# Patient Record
Sex: Male | Born: 1940
Health system: Southern US, Community
[De-identification: ages and names within clinical notes are randomized; demographics above are authoritative.]

## PROBLEM LIST (undated history)

## (undated) DIAGNOSIS — K219 Gastro-esophageal reflux disease without esophagitis: Secondary | ICD-10-CM

## (undated) DIAGNOSIS — I499 Cardiac arrhythmia, unspecified: Secondary | ICD-10-CM

## (undated) DIAGNOSIS — C3490 Malignant neoplasm of unspecified part of unspecified bronchus or lung: Secondary | ICD-10-CM

## (undated) DIAGNOSIS — J449 Chronic obstructive pulmonary disease, unspecified: Secondary | ICD-10-CM

## (undated) DIAGNOSIS — E119 Type 2 diabetes mellitus without complications: Secondary | ICD-10-CM

## (undated) DIAGNOSIS — I2699 Other pulmonary embolism without acute cor pulmonale: Secondary | ICD-10-CM

## (undated) DIAGNOSIS — I5189 Other ill-defined heart diseases: Secondary | ICD-10-CM

## (undated) DIAGNOSIS — I509 Heart failure, unspecified: Secondary | ICD-10-CM

## (undated) DIAGNOSIS — M109 Gout, unspecified: Secondary | ICD-10-CM

## (undated) DIAGNOSIS — Z8546 Personal history of malignant neoplasm of prostate: Secondary | ICD-10-CM

## (undated) DIAGNOSIS — E785 Hyperlipidemia, unspecified: Secondary | ICD-10-CM

## (undated) DIAGNOSIS — I10 Essential (primary) hypertension: Secondary | ICD-10-CM

## (undated) DIAGNOSIS — J45909 Unspecified asthma, uncomplicated: Secondary | ICD-10-CM

## (undated) DIAGNOSIS — I493 Ventricular premature depolarization: Secondary | ICD-10-CM

## (undated) DIAGNOSIS — R5383 Other fatigue: Secondary | ICD-10-CM

## (undated) HISTORY — DX: Malignant neoplasm of unspecified part of unspecified bronchus or lung: C34.90

## (undated) HISTORY — PX: HERNIA REPAIR: SHX51

## (undated) HISTORY — DX: Chronic obstructive pulmonary disease, unspecified: J44.9

## (undated) HISTORY — DX: Personal history of malignant neoplasm of prostate: Z85.46

## (undated) HISTORY — PX: TONSILLECTOMY: SUR1361

## (undated) HISTORY — DX: Other fatigue: R53.83

## (undated) HISTORY — DX: Ventricular premature depolarization: I49.3

## (undated) HISTORY — PX: CATARACT EXTRACTION, BILATERAL: SHX1313

## (undated) HISTORY — DX: Other pulmonary embolism without acute cor pulmonale: I26.99

## (undated) HISTORY — DX: Heart failure, unspecified: I50.9

## (undated) HISTORY — DX: Type 2 diabetes mellitus without complications: E11.9

## (undated) HISTORY — PX: CHOLECYSTECTOMY: SHX55

## (undated) HISTORY — PX: STENT PLACEMENT VASCULAR (ARMC HX): HXRAD1737

## (undated) HISTORY — DX: Gastro-esophageal reflux disease without esophagitis: K21.9

## (undated) HISTORY — PX: ABDOMINAL AORTIC ANEURYSM REPAIR: SHX42

## (undated) HISTORY — PX: HEMICOLECTOMY: SHX854

## (undated) HISTORY — PX: AORTA - ILIAC ARTERY BYPASS GRAFT: SUR174

## (undated) HISTORY — DX: Other ill-defined heart diseases: I51.89

## (undated) HISTORY — DX: Essential (primary) hypertension: I10

## (undated) HISTORY — DX: Gout, unspecified: M10.9

## (undated) HISTORY — DX: Hyperlipidemia, unspecified: E78.5

## (undated) HISTORY — DX: Unspecified asthma, uncomplicated: J45.909

## (undated) HISTORY — PX: REPLACEMENT TOTAL KNEE: SUR1224

## (undated) HISTORY — DX: Cardiac arrhythmia, unspecified: I49.9

## (undated) HISTORY — PX: PROSTATE SURGERY: SHX751

## (undated) HISTORY — PX: KNEE SURGERY: SHX244

## (undated) HISTORY — PX: BACK SURGERY: SHX140

---

## 2011-02-28 DIAGNOSIS — H43819 Vitreous degeneration, unspecified eye: Secondary | ICD-10-CM | POA: Diagnosis not present

## 2011-03-02 DIAGNOSIS — E785 Hyperlipidemia, unspecified: Secondary | ICD-10-CM | POA: Diagnosis not present

## 2011-03-02 DIAGNOSIS — I6529 Occlusion and stenosis of unspecified carotid artery: Secondary | ICD-10-CM | POA: Diagnosis not present

## 2011-03-02 DIAGNOSIS — M5137 Other intervertebral disc degeneration, lumbosacral region: Secondary | ICD-10-CM | POA: Diagnosis not present

## 2011-03-02 DIAGNOSIS — I10 Essential (primary) hypertension: Secondary | ICD-10-CM | POA: Diagnosis not present

## 2011-03-06 DIAGNOSIS — R0902 Hypoxemia: Secondary | ICD-10-CM | POA: Diagnosis not present

## 2011-03-06 DIAGNOSIS — Z79899 Other long term (current) drug therapy: Secondary | ICD-10-CM | POA: Diagnosis not present

## 2011-03-06 DIAGNOSIS — R0989 Other specified symptoms and signs involving the circulatory and respiratory systems: Secondary | ICD-10-CM | POA: Diagnosis not present

## 2011-03-06 DIAGNOSIS — R0609 Other forms of dyspnea: Secondary | ICD-10-CM | POA: Diagnosis not present

## 2011-03-06 DIAGNOSIS — J309 Allergic rhinitis, unspecified: Secondary | ICD-10-CM | POA: Diagnosis not present

## 2011-03-29 DIAGNOSIS — Z79899 Other long term (current) drug therapy: Secondary | ICD-10-CM | POA: Diagnosis not present

## 2011-04-03 DIAGNOSIS — J309 Allergic rhinitis, unspecified: Secondary | ICD-10-CM | POA: Diagnosis not present

## 2011-04-03 DIAGNOSIS — I519 Heart disease, unspecified: Secondary | ICD-10-CM | POA: Diagnosis not present

## 2011-04-03 DIAGNOSIS — R0609 Other forms of dyspnea: Secondary | ICD-10-CM | POA: Diagnosis not present

## 2011-04-24 DIAGNOSIS — Z79899 Other long term (current) drug therapy: Secondary | ICD-10-CM | POA: Diagnosis not present

## 2011-05-25 DIAGNOSIS — R972 Elevated prostate specific antigen [PSA]: Secondary | ICD-10-CM | POA: Diagnosis not present

## 2011-05-30 DIAGNOSIS — I658 Occlusion and stenosis of other precerebral arteries: Secondary | ICD-10-CM | POA: Diagnosis not present

## 2011-05-30 DIAGNOSIS — I739 Peripheral vascular disease, unspecified: Secondary | ICD-10-CM | POA: Diagnosis not present

## 2011-05-30 DIAGNOSIS — I70299 Other atherosclerosis of native arteries of extremities, unspecified extremity: Secondary | ICD-10-CM | POA: Diagnosis not present

## 2011-06-01 DIAGNOSIS — C61 Malignant neoplasm of prostate: Secondary | ICD-10-CM | POA: Diagnosis not present

## 2011-06-01 DIAGNOSIS — N529 Male erectile dysfunction, unspecified: Secondary | ICD-10-CM | POA: Diagnosis not present

## 2011-06-07 DIAGNOSIS — H6120 Impacted cerumen, unspecified ear: Secondary | ICD-10-CM | POA: Diagnosis not present

## 2011-06-12 DIAGNOSIS — I714 Abdominal aortic aneurysm, without rupture: Secondary | ICD-10-CM | POA: Diagnosis not present

## 2011-06-12 DIAGNOSIS — I6529 Occlusion and stenosis of unspecified carotid artery: Secondary | ICD-10-CM | POA: Diagnosis not present

## 2011-07-27 DIAGNOSIS — H903 Sensorineural hearing loss, bilateral: Secondary | ICD-10-CM | POA: Diagnosis not present

## 2011-07-27 DIAGNOSIS — H9319 Tinnitus, unspecified ear: Secondary | ICD-10-CM | POA: Diagnosis not present

## 2011-08-30 DIAGNOSIS — I509 Heart failure, unspecified: Secondary | ICD-10-CM | POA: Diagnosis not present

## 2011-08-30 DIAGNOSIS — E785 Hyperlipidemia, unspecified: Secondary | ICD-10-CM | POA: Diagnosis not present

## 2011-08-30 DIAGNOSIS — I6529 Occlusion and stenosis of unspecified carotid artery: Secondary | ICD-10-CM | POA: Diagnosis not present

## 2011-08-30 DIAGNOSIS — I714 Abdominal aortic aneurysm, without rupture: Secondary | ICD-10-CM | POA: Diagnosis not present

## 2011-08-30 DIAGNOSIS — I10 Essential (primary) hypertension: Secondary | ICD-10-CM | POA: Diagnosis not present

## 2011-11-07 DIAGNOSIS — Z23 Encounter for immunization: Secondary | ICD-10-CM | POA: Diagnosis not present

## 2011-11-07 DIAGNOSIS — L723 Sebaceous cyst: Secondary | ICD-10-CM | POA: Diagnosis not present

## 2011-11-07 DIAGNOSIS — M5137 Other intervertebral disc degeneration, lumbosacral region: Secondary | ICD-10-CM | POA: Diagnosis not present

## 2011-11-15 DIAGNOSIS — L723 Sebaceous cyst: Secondary | ICD-10-CM | POA: Diagnosis not present

## 2011-11-15 DIAGNOSIS — R221 Localized swelling, mass and lump, neck: Secondary | ICD-10-CM | POA: Diagnosis not present

## 2011-11-15 DIAGNOSIS — R220 Localized swelling, mass and lump, head: Secondary | ICD-10-CM | POA: Diagnosis not present

## 2011-12-10 DIAGNOSIS — L723 Sebaceous cyst: Secondary | ICD-10-CM | POA: Diagnosis not present

## 2011-12-10 DIAGNOSIS — M4800 Spinal stenosis, site unspecified: Secondary | ICD-10-CM | POA: Diagnosis not present

## 2012-01-08 DIAGNOSIS — E7800 Pure hypercholesterolemia, unspecified: Secondary | ICD-10-CM | POA: Diagnosis not present

## 2012-01-08 DIAGNOSIS — E785 Hyperlipidemia, unspecified: Secondary | ICD-10-CM | POA: Diagnosis not present

## 2012-01-08 DIAGNOSIS — I6529 Occlusion and stenosis of unspecified carotid artery: Secondary | ICD-10-CM | POA: Diagnosis not present

## 2012-01-08 DIAGNOSIS — Z0000 Encounter for general adult medical examination without abnormal findings: Secondary | ICD-10-CM | POA: Diagnosis not present

## 2012-02-08 DIAGNOSIS — J40 Bronchitis, not specified as acute or chronic: Secondary | ICD-10-CM | POA: Diagnosis not present

## 2012-02-08 DIAGNOSIS — J029 Acute pharyngitis, unspecified: Secondary | ICD-10-CM | POA: Diagnosis not present

## 2012-02-18 DIAGNOSIS — J449 Chronic obstructive pulmonary disease, unspecified: Secondary | ICD-10-CM | POA: Diagnosis not present

## 2012-02-20 DIAGNOSIS — M79609 Pain in unspecified limb: Secondary | ICD-10-CM | POA: Diagnosis not present

## 2012-02-20 DIAGNOSIS — L600 Ingrowing nail: Secondary | ICD-10-CM | POA: Diagnosis not present

## 2012-03-05 DIAGNOSIS — I10 Essential (primary) hypertension: Secondary | ICD-10-CM | POA: Diagnosis not present

## 2012-04-02 DIAGNOSIS — J449 Chronic obstructive pulmonary disease, unspecified: Secondary | ICD-10-CM | POA: Diagnosis not present

## 2012-04-02 DIAGNOSIS — R062 Wheezing: Secondary | ICD-10-CM | POA: Diagnosis not present

## 2012-05-29 DIAGNOSIS — I719 Aortic aneurysm of unspecified site, without rupture: Secondary | ICD-10-CM | POA: Diagnosis not present

## 2012-05-29 DIAGNOSIS — I70299 Other atherosclerosis of native arteries of extremities, unspecified extremity: Secondary | ICD-10-CM | POA: Diagnosis not present

## 2012-06-19 DIAGNOSIS — I714 Abdominal aortic aneurysm, without rupture: Secondary | ICD-10-CM | POA: Diagnosis not present

## 2012-07-29 DIAGNOSIS — J449 Chronic obstructive pulmonary disease, unspecified: Secondary | ICD-10-CM | POA: Diagnosis not present

## 2012-08-04 DIAGNOSIS — J449 Chronic obstructive pulmonary disease, unspecified: Secondary | ICD-10-CM | POA: Diagnosis not present

## 2012-08-15 DIAGNOSIS — I10 Essential (primary) hypertension: Secondary | ICD-10-CM | POA: Diagnosis not present

## 2012-08-15 DIAGNOSIS — I9589 Other hypotension: Secondary | ICD-10-CM | POA: Diagnosis not present

## 2012-08-15 DIAGNOSIS — E7800 Pure hypercholesterolemia, unspecified: Secondary | ICD-10-CM | POA: Diagnosis not present

## 2012-08-15 DIAGNOSIS — R9431 Abnormal electrocardiogram [ECG] [EKG]: Secondary | ICD-10-CM | POA: Diagnosis not present

## 2012-08-26 DIAGNOSIS — I119 Hypertensive heart disease without heart failure: Secondary | ICD-10-CM | POA: Diagnosis not present

## 2012-08-26 DIAGNOSIS — I2510 Atherosclerotic heart disease of native coronary artery without angina pectoris: Secondary | ICD-10-CM | POA: Diagnosis not present

## 2012-09-04 DIAGNOSIS — I10 Essential (primary) hypertension: Secondary | ICD-10-CM | POA: Diagnosis not present

## 2012-09-04 DIAGNOSIS — I714 Abdominal aortic aneurysm, without rupture: Secondary | ICD-10-CM | POA: Diagnosis not present

## 2012-09-04 DIAGNOSIS — E785 Hyperlipidemia, unspecified: Secondary | ICD-10-CM | POA: Diagnosis not present

## 2012-09-04 DIAGNOSIS — R7309 Other abnormal glucose: Secondary | ICD-10-CM | POA: Diagnosis not present

## 2012-09-11 DIAGNOSIS — R9431 Abnormal electrocardiogram [ECG] [EKG]: Secondary | ICD-10-CM | POA: Diagnosis not present

## 2012-09-11 DIAGNOSIS — E785 Hyperlipidemia, unspecified: Secondary | ICD-10-CM | POA: Diagnosis not present

## 2013-01-27 DIAGNOSIS — E669 Obesity, unspecified: Secondary | ICD-10-CM | POA: Diagnosis not present

## 2013-01-27 DIAGNOSIS — I2510 Atherosclerotic heart disease of native coronary artery without angina pectoris: Secondary | ICD-10-CM | POA: Diagnosis not present

## 2013-01-27 DIAGNOSIS — E785 Hyperlipidemia, unspecified: Secondary | ICD-10-CM | POA: Diagnosis not present

## 2013-01-27 DIAGNOSIS — Z23 Encounter for immunization: Secondary | ICD-10-CM | POA: Diagnosis not present

## 2013-01-27 DIAGNOSIS — C61 Malignant neoplasm of prostate: Secondary | ICD-10-CM | POA: Diagnosis not present

## 2013-01-27 DIAGNOSIS — M4800 Spinal stenosis, site unspecified: Secondary | ICD-10-CM | POA: Diagnosis not present

## 2013-01-27 DIAGNOSIS — J449 Chronic obstructive pulmonary disease, unspecified: Secondary | ICD-10-CM | POA: Diagnosis not present

## 2013-01-27 DIAGNOSIS — I10 Essential (primary) hypertension: Secondary | ICD-10-CM | POA: Diagnosis not present

## 2013-01-27 DIAGNOSIS — R7309 Other abnormal glucose: Secondary | ICD-10-CM | POA: Diagnosis not present

## 2013-01-27 DIAGNOSIS — M5137 Other intervertebral disc degeneration, lumbosacral region: Secondary | ICD-10-CM | POA: Diagnosis not present

## 2013-01-30 DIAGNOSIS — E041 Nontoxic single thyroid nodule: Secondary | ICD-10-CM | POA: Diagnosis not present

## 2013-01-30 DIAGNOSIS — E049 Nontoxic goiter, unspecified: Secondary | ICD-10-CM | POA: Diagnosis not present

## 2013-03-09 DIAGNOSIS — R944 Abnormal results of kidney function studies: Secondary | ICD-10-CM | POA: Diagnosis not present

## 2013-04-22 DIAGNOSIS — J449 Chronic obstructive pulmonary disease, unspecified: Secondary | ICD-10-CM | POA: Diagnosis not present

## 2013-04-30 DIAGNOSIS — Z6836 Body mass index (BMI) 36.0-36.9, adult: Secondary | ICD-10-CM | POA: Diagnosis not present

## 2013-04-30 DIAGNOSIS — M545 Low back pain, unspecified: Secondary | ICD-10-CM | POA: Diagnosis not present

## 2013-05-11 DIAGNOSIS — E119 Type 2 diabetes mellitus without complications: Secondary | ICD-10-CM | POA: Diagnosis present

## 2013-05-11 DIAGNOSIS — M7989 Other specified soft tissue disorders: Secondary | ICD-10-CM | POA: Diagnosis not present

## 2013-05-11 DIAGNOSIS — Z6836 Body mass index (BMI) 36.0-36.9, adult: Secondary | ICD-10-CM | POA: Diagnosis not present

## 2013-05-11 DIAGNOSIS — J4489 Other specified chronic obstructive pulmonary disease: Secondary | ICD-10-CM | POA: Diagnosis not present

## 2013-05-11 DIAGNOSIS — I10 Essential (primary) hypertension: Secondary | ICD-10-CM | POA: Diagnosis not present

## 2013-05-11 DIAGNOSIS — Z87891 Personal history of nicotine dependence: Secondary | ICD-10-CM | POA: Diagnosis not present

## 2013-05-11 DIAGNOSIS — J449 Chronic obstructive pulmonary disease, unspecified: Secondary | ICD-10-CM | POA: Diagnosis not present

## 2013-05-11 DIAGNOSIS — R0902 Hypoxemia: Secondary | ICD-10-CM | POA: Diagnosis not present

## 2013-05-11 DIAGNOSIS — J189 Pneumonia, unspecified organism: Secondary | ICD-10-CM | POA: Diagnosis not present

## 2013-05-11 DIAGNOSIS — Z8546 Personal history of malignant neoplasm of prostate: Secondary | ICD-10-CM | POA: Diagnosis not present

## 2013-05-11 DIAGNOSIS — R51 Headache: Secondary | ICD-10-CM | POA: Diagnosis not present

## 2013-05-11 DIAGNOSIS — J441 Chronic obstructive pulmonary disease with (acute) exacerbation: Secondary | ICD-10-CM | POA: Diagnosis not present

## 2013-05-11 DIAGNOSIS — Z79899 Other long term (current) drug therapy: Secondary | ICD-10-CM | POA: Diagnosis not present

## 2013-05-11 DIAGNOSIS — I739 Peripheral vascular disease, unspecified: Secondary | ICD-10-CM | POA: Diagnosis not present

## 2013-05-11 DIAGNOSIS — R0602 Shortness of breath: Secondary | ICD-10-CM | POA: Diagnosis not present

## 2013-05-11 DIAGNOSIS — R079 Chest pain, unspecified: Secondary | ICD-10-CM | POA: Diagnosis not present

## 2013-05-11 DIAGNOSIS — Z801 Family history of malignant neoplasm of trachea, bronchus and lung: Secondary | ICD-10-CM | POA: Diagnosis not present

## 2013-05-11 DIAGNOSIS — J9610 Chronic respiratory failure, unspecified whether with hypoxia or hypercapnia: Secondary | ICD-10-CM | POA: Diagnosis not present

## 2013-05-11 DIAGNOSIS — Z9981 Dependence on supplemental oxygen: Secondary | ICD-10-CM | POA: Diagnosis not present

## 2013-05-11 DIAGNOSIS — Z8042 Family history of malignant neoplasm of prostate: Secondary | ICD-10-CM | POA: Diagnosis not present

## 2013-05-11 DIAGNOSIS — R0789 Other chest pain: Secondary | ICD-10-CM | POA: Diagnosis not present

## 2013-05-12 DIAGNOSIS — J449 Chronic obstructive pulmonary disease, unspecified: Secondary | ICD-10-CM | POA: Diagnosis not present

## 2013-05-25 DIAGNOSIS — I10 Essential (primary) hypertension: Secondary | ICD-10-CM | POA: Diagnosis not present

## 2013-05-25 DIAGNOSIS — J449 Chronic obstructive pulmonary disease, unspecified: Secondary | ICD-10-CM | POA: Diagnosis not present

## 2013-05-29 DIAGNOSIS — K5730 Diverticulosis of large intestine without perforation or abscess without bleeding: Secondary | ICD-10-CM | POA: Diagnosis not present

## 2013-05-29 DIAGNOSIS — K7689 Other specified diseases of liver: Secondary | ICD-10-CM | POA: Diagnosis not present

## 2013-05-29 DIAGNOSIS — N200 Calculus of kidney: Secondary | ICD-10-CM | POA: Diagnosis not present

## 2013-05-29 DIAGNOSIS — K429 Umbilical hernia without obstruction or gangrene: Secondary | ICD-10-CM | POA: Diagnosis not present

## 2013-06-01 DIAGNOSIS — M5137 Other intervertebral disc degeneration, lumbosacral region: Secondary | ICD-10-CM | POA: Diagnosis not present

## 2013-06-01 DIAGNOSIS — I519 Heart disease, unspecified: Secondary | ICD-10-CM | POA: Diagnosis not present

## 2013-06-05 DIAGNOSIS — K5710 Diverticulosis of small intestine without perforation or abscess without bleeding: Secondary | ICD-10-CM | POA: Diagnosis not present

## 2013-06-05 DIAGNOSIS — K7689 Other specified diseases of liver: Secondary | ICD-10-CM | POA: Diagnosis not present

## 2013-07-29 DIAGNOSIS — I714 Abdominal aortic aneurysm, without rupture, unspecified: Secondary | ICD-10-CM | POA: Diagnosis not present

## 2013-07-29 DIAGNOSIS — E785 Hyperlipidemia, unspecified: Secondary | ICD-10-CM | POA: Diagnosis not present

## 2013-07-29 DIAGNOSIS — E669 Obesity, unspecified: Secondary | ICD-10-CM | POA: Diagnosis not present

## 2013-07-29 DIAGNOSIS — I10 Essential (primary) hypertension: Secondary | ICD-10-CM | POA: Diagnosis not present

## 2013-07-29 DIAGNOSIS — I719 Aortic aneurysm of unspecified site, without rupture: Secondary | ICD-10-CM | POA: Diagnosis not present

## 2013-07-29 DIAGNOSIS — I6529 Occlusion and stenosis of unspecified carotid artery: Secondary | ICD-10-CM | POA: Diagnosis not present

## 2013-07-29 DIAGNOSIS — I658 Occlusion and stenosis of other precerebral arteries: Secondary | ICD-10-CM | POA: Diagnosis not present

## 2013-07-30 DIAGNOSIS — M25559 Pain in unspecified hip: Secondary | ICD-10-CM | POA: Diagnosis not present

## 2013-07-30 DIAGNOSIS — R0609 Other forms of dyspnea: Secondary | ICD-10-CM | POA: Diagnosis not present

## 2013-08-19 DIAGNOSIS — M1610 Unilateral primary osteoarthritis, unspecified hip: Secondary | ICD-10-CM | POA: Diagnosis not present

## 2013-08-19 DIAGNOSIS — M48061 Spinal stenosis, lumbar region without neurogenic claudication: Secondary | ICD-10-CM | POA: Diagnosis not present

## 2013-08-24 DIAGNOSIS — M5137 Other intervertebral disc degeneration, lumbosacral region: Secondary | ICD-10-CM | POA: Diagnosis not present

## 2013-08-24 DIAGNOSIS — M48061 Spinal stenosis, lumbar region without neurogenic claudication: Secondary | ICD-10-CM | POA: Diagnosis not present

## 2013-08-24 DIAGNOSIS — M5126 Other intervertebral disc displacement, lumbar region: Secondary | ICD-10-CM | POA: Diagnosis not present

## 2013-08-27 DIAGNOSIS — M48061 Spinal stenosis, lumbar region without neurogenic claudication: Secondary | ICD-10-CM | POA: Diagnosis not present

## 2013-08-27 DIAGNOSIS — M5137 Other intervertebral disc degeneration, lumbosacral region: Secondary | ICD-10-CM | POA: Diagnosis not present

## 2013-08-27 DIAGNOSIS — M48062 Spinal stenosis, lumbar region with neurogenic claudication: Secondary | ICD-10-CM | POA: Diagnosis not present

## 2013-08-28 DIAGNOSIS — M48061 Spinal stenosis, lumbar region without neurogenic claudication: Secondary | ICD-10-CM | POA: Diagnosis not present

## 2013-08-28 DIAGNOSIS — IMO0002 Reserved for concepts with insufficient information to code with codable children: Secondary | ICD-10-CM | POA: Diagnosis not present

## 2013-08-28 DIAGNOSIS — M5137 Other intervertebral disc degeneration, lumbosacral region: Secondary | ICD-10-CM | POA: Diagnosis not present

## 2013-08-28 DIAGNOSIS — M5126 Other intervertebral disc displacement, lumbar region: Secondary | ICD-10-CM | POA: Diagnosis not present

## 2013-09-24 DIAGNOSIS — M545 Low back pain, unspecified: Secondary | ICD-10-CM | POA: Diagnosis not present

## 2013-09-24 DIAGNOSIS — Z9181 History of falling: Secondary | ICD-10-CM | POA: Diagnosis not present

## 2013-09-24 DIAGNOSIS — E7800 Pure hypercholesterolemia, unspecified: Secondary | ICD-10-CM | POA: Diagnosis not present

## 2013-09-24 DIAGNOSIS — J4489 Other specified chronic obstructive pulmonary disease: Secondary | ICD-10-CM | POA: Diagnosis not present

## 2013-09-24 DIAGNOSIS — J449 Chronic obstructive pulmonary disease, unspecified: Secondary | ICD-10-CM | POA: Diagnosis not present

## 2013-09-24 DIAGNOSIS — I10 Essential (primary) hypertension: Secondary | ICD-10-CM | POA: Diagnosis not present

## 2013-09-30 DIAGNOSIS — B370 Candidal stomatitis: Secondary | ICD-10-CM | POA: Diagnosis not present

## 2013-09-30 DIAGNOSIS — E119 Type 2 diabetes mellitus without complications: Secondary | ICD-10-CM | POA: Diagnosis not present

## 2013-10-14 DIAGNOSIS — Z6835 Body mass index (BMI) 35.0-35.9, adult: Secondary | ICD-10-CM | POA: Diagnosis not present

## 2013-10-14 DIAGNOSIS — E119 Type 2 diabetes mellitus without complications: Secondary | ICD-10-CM | POA: Diagnosis not present

## 2013-10-14 DIAGNOSIS — Z23 Encounter for immunization: Secondary | ICD-10-CM | POA: Diagnosis not present

## 2013-10-30 DIAGNOSIS — M5186 Other intervertebral disc disorders, lumbar region: Secondary | ICD-10-CM | POA: Diagnosis not present

## 2013-10-30 DIAGNOSIS — M5416 Radiculopathy, lumbar region: Secondary | ICD-10-CM | POA: Diagnosis not present

## 2013-10-30 DIAGNOSIS — M5136 Other intervertebral disc degeneration, lumbar region: Secondary | ICD-10-CM | POA: Diagnosis not present

## 2013-10-30 DIAGNOSIS — M5410 Radiculopathy, site unspecified: Secondary | ICD-10-CM | POA: Diagnosis not present

## 2013-10-30 DIAGNOSIS — M5126 Other intervertebral disc displacement, lumbar region: Secondary | ICD-10-CM | POA: Diagnosis not present

## 2013-10-30 DIAGNOSIS — M4806 Spinal stenosis, lumbar region: Secondary | ICD-10-CM | POA: Diagnosis not present

## 2013-11-25 DIAGNOSIS — M1990 Unspecified osteoarthritis, unspecified site: Secondary | ICD-10-CM | POA: Diagnosis not present

## 2013-11-25 DIAGNOSIS — Z6834 Body mass index (BMI) 34.0-34.9, adult: Secondary | ICD-10-CM | POA: Diagnosis not present

## 2013-11-25 DIAGNOSIS — J449 Chronic obstructive pulmonary disease, unspecified: Secondary | ICD-10-CM | POA: Diagnosis not present

## 2013-11-25 DIAGNOSIS — E785 Hyperlipidemia, unspecified: Secondary | ICD-10-CM | POA: Diagnosis not present

## 2013-11-25 DIAGNOSIS — I10 Essential (primary) hypertension: Secondary | ICD-10-CM | POA: Diagnosis not present

## 2013-11-25 DIAGNOSIS — M545 Low back pain: Secondary | ICD-10-CM | POA: Diagnosis not present

## 2013-11-25 DIAGNOSIS — M1000 Idiopathic gout, unspecified site: Secondary | ICD-10-CM | POA: Diagnosis not present

## 2013-11-25 DIAGNOSIS — E119 Type 2 diabetes mellitus without complications: Secondary | ICD-10-CM | POA: Diagnosis not present

## 2013-12-08 DIAGNOSIS — M4806 Spinal stenosis, lumbar region: Secondary | ICD-10-CM | POA: Diagnosis not present

## 2013-12-08 DIAGNOSIS — M5136 Other intervertebral disc degeneration, lumbar region: Secondary | ICD-10-CM | POA: Diagnosis not present

## 2013-12-16 DIAGNOSIS — Z79899 Other long term (current) drug therapy: Secondary | ICD-10-CM | POA: Diagnosis not present

## 2013-12-16 DIAGNOSIS — Z01818 Encounter for other preprocedural examination: Secondary | ICD-10-CM | POA: Diagnosis not present

## 2013-12-16 DIAGNOSIS — M79609 Pain in unspecified limb: Secondary | ICD-10-CM | POA: Diagnosis not present

## 2013-12-16 DIAGNOSIS — I10 Essential (primary) hypertension: Secondary | ICD-10-CM | POA: Diagnosis not present

## 2013-12-29 DIAGNOSIS — M4806 Spinal stenosis, lumbar region: Secondary | ICD-10-CM | POA: Diagnosis not present

## 2014-01-11 DIAGNOSIS — Z79899 Other long term (current) drug therapy: Secondary | ICD-10-CM | POA: Diagnosis not present

## 2014-01-11 DIAGNOSIS — M4806 Spinal stenosis, lumbar region: Secondary | ICD-10-CM | POA: Diagnosis not present

## 2014-01-11 DIAGNOSIS — R2689 Other abnormalities of gait and mobility: Secondary | ICD-10-CM | POA: Diagnosis not present

## 2014-01-11 DIAGNOSIS — M5126 Other intervertebral disc displacement, lumbar region: Secondary | ICD-10-CM | POA: Diagnosis not present

## 2014-01-11 DIAGNOSIS — J449 Chronic obstructive pulmonary disease, unspecified: Secondary | ICD-10-CM | POA: Diagnosis not present

## 2014-01-11 DIAGNOSIS — Z7982 Long term (current) use of aspirin: Secondary | ICD-10-CM | POA: Diagnosis not present

## 2014-01-11 DIAGNOSIS — E119 Type 2 diabetes mellitus without complications: Secondary | ICD-10-CM | POA: Diagnosis not present

## 2014-01-11 DIAGNOSIS — E780 Pure hypercholesterolemia: Secondary | ICD-10-CM | POA: Diagnosis not present

## 2014-01-11 DIAGNOSIS — M5416 Radiculopathy, lumbar region: Secondary | ICD-10-CM | POA: Diagnosis not present

## 2014-01-11 DIAGNOSIS — Z87891 Personal history of nicotine dependence: Secondary | ICD-10-CM | POA: Diagnosis not present

## 2014-01-11 DIAGNOSIS — Z8719 Personal history of other diseases of the digestive system: Secondary | ICD-10-CM | POA: Diagnosis not present

## 2014-01-11 DIAGNOSIS — I10 Essential (primary) hypertension: Secondary | ICD-10-CM | POA: Diagnosis not present

## 2014-01-11 DIAGNOSIS — M5116 Intervertebral disc disorders with radiculopathy, lumbar region: Secondary | ICD-10-CM | POA: Diagnosis not present

## 2014-01-11 DIAGNOSIS — M5136 Other intervertebral disc degeneration, lumbar region: Secondary | ICD-10-CM | POA: Diagnosis not present

## 2014-01-12 DIAGNOSIS — M5416 Radiculopathy, lumbar region: Secondary | ICD-10-CM | POA: Diagnosis not present

## 2014-01-12 DIAGNOSIS — R2689 Other abnormalities of gait and mobility: Secondary | ICD-10-CM | POA: Diagnosis not present

## 2014-01-12 DIAGNOSIS — E119 Type 2 diabetes mellitus without complications: Secondary | ICD-10-CM | POA: Diagnosis not present

## 2014-01-12 DIAGNOSIS — M4806 Spinal stenosis, lumbar region: Secondary | ICD-10-CM | POA: Diagnosis not present

## 2014-01-12 DIAGNOSIS — M5126 Other intervertebral disc displacement, lumbar region: Secondary | ICD-10-CM | POA: Diagnosis not present

## 2014-01-12 DIAGNOSIS — M5116 Intervertebral disc disorders with radiculopathy, lumbar region: Secondary | ICD-10-CM | POA: Diagnosis not present

## 2014-02-24 DIAGNOSIS — I10 Essential (primary) hypertension: Secondary | ICD-10-CM | POA: Diagnosis not present

## 2014-02-24 DIAGNOSIS — M109 Gout, unspecified: Secondary | ICD-10-CM | POA: Diagnosis not present

## 2014-02-24 DIAGNOSIS — M1990 Unspecified osteoarthritis, unspecified site: Secondary | ICD-10-CM | POA: Diagnosis not present

## 2014-02-24 DIAGNOSIS — G4700 Insomnia, unspecified: Secondary | ICD-10-CM | POA: Diagnosis not present

## 2014-02-24 DIAGNOSIS — B370 Candidal stomatitis: Secondary | ICD-10-CM | POA: Diagnosis not present

## 2014-02-24 DIAGNOSIS — Z6833 Body mass index (BMI) 33.0-33.9, adult: Secondary | ICD-10-CM | POA: Diagnosis not present

## 2014-02-24 DIAGNOSIS — E785 Hyperlipidemia, unspecified: Secondary | ICD-10-CM | POA: Diagnosis not present

## 2014-02-24 DIAGNOSIS — E119 Type 2 diabetes mellitus without complications: Secondary | ICD-10-CM | POA: Diagnosis not present

## 2014-02-24 DIAGNOSIS — K219 Gastro-esophageal reflux disease without esophagitis: Secondary | ICD-10-CM | POA: Diagnosis not present

## 2014-02-24 DIAGNOSIS — J449 Chronic obstructive pulmonary disease, unspecified: Secondary | ICD-10-CM | POA: Diagnosis not present

## 2014-03-30 DIAGNOSIS — Z9889 Other specified postprocedural states: Secondary | ICD-10-CM | POA: Diagnosis not present

## 2014-05-25 DIAGNOSIS — E119 Type 2 diabetes mellitus without complications: Secondary | ICD-10-CM | POA: Diagnosis not present

## 2014-05-25 DIAGNOSIS — E669 Obesity, unspecified: Secondary | ICD-10-CM | POA: Diagnosis not present

## 2014-05-25 DIAGNOSIS — G4700 Insomnia, unspecified: Secondary | ICD-10-CM | POA: Diagnosis not present

## 2014-05-25 DIAGNOSIS — J449 Chronic obstructive pulmonary disease, unspecified: Secondary | ICD-10-CM | POA: Diagnosis not present

## 2014-05-25 DIAGNOSIS — K219 Gastro-esophageal reflux disease without esophagitis: Secondary | ICD-10-CM | POA: Diagnosis not present

## 2014-05-25 DIAGNOSIS — M1000 Idiopathic gout, unspecified site: Secondary | ICD-10-CM | POA: Diagnosis not present

## 2014-05-25 DIAGNOSIS — E785 Hyperlipidemia, unspecified: Secondary | ICD-10-CM | POA: Diagnosis not present

## 2014-05-25 DIAGNOSIS — I10 Essential (primary) hypertension: Secondary | ICD-10-CM | POA: Diagnosis not present

## 2014-05-25 DIAGNOSIS — Z6833 Body mass index (BMI) 33.0-33.9, adult: Secondary | ICD-10-CM | POA: Diagnosis not present

## 2014-05-25 DIAGNOSIS — M545 Low back pain: Secondary | ICD-10-CM | POA: Diagnosis not present

## 2014-06-23 DIAGNOSIS — J449 Chronic obstructive pulmonary disease, unspecified: Secondary | ICD-10-CM | POA: Diagnosis not present

## 2014-06-30 DIAGNOSIS — Z9889 Other specified postprocedural states: Secondary | ICD-10-CM | POA: Diagnosis not present

## 2014-07-06 DIAGNOSIS — J449 Chronic obstructive pulmonary disease, unspecified: Secondary | ICD-10-CM | POA: Diagnosis not present

## 2014-09-08 DIAGNOSIS — E119 Type 2 diabetes mellitus without complications: Secondary | ICD-10-CM | POA: Diagnosis not present

## 2014-09-10 DIAGNOSIS — R351 Nocturia: Secondary | ICD-10-CM | POA: Diagnosis not present

## 2014-09-10 DIAGNOSIS — R312 Other microscopic hematuria: Secondary | ICD-10-CM | POA: Diagnosis not present

## 2014-09-10 DIAGNOSIS — R300 Dysuria: Secondary | ICD-10-CM | POA: Diagnosis not present

## 2014-09-10 DIAGNOSIS — R350 Frequency of micturition: Secondary | ICD-10-CM | POA: Diagnosis not present

## 2014-09-10 DIAGNOSIS — Z8546 Personal history of malignant neoplasm of prostate: Secondary | ICD-10-CM | POA: Diagnosis not present

## 2014-09-10 DIAGNOSIS — Z6835 Body mass index (BMI) 35.0-35.9, adult: Secondary | ICD-10-CM | POA: Diagnosis not present

## 2014-09-14 DIAGNOSIS — K5730 Diverticulosis of large intestine without perforation or abscess without bleeding: Secondary | ICD-10-CM | POA: Diagnosis not present

## 2014-09-14 DIAGNOSIS — R112 Nausea with vomiting, unspecified: Secondary | ICD-10-CM | POA: Diagnosis not present

## 2014-09-14 DIAGNOSIS — R109 Unspecified abdominal pain: Secondary | ICD-10-CM | POA: Diagnosis not present

## 2014-09-14 DIAGNOSIS — Z9049 Acquired absence of other specified parts of digestive tract: Secondary | ICD-10-CM | POA: Diagnosis not present

## 2014-09-14 DIAGNOSIS — K2980 Duodenitis without bleeding: Secondary | ICD-10-CM | POA: Diagnosis not present

## 2014-09-15 DIAGNOSIS — R933 Abnormal findings on diagnostic imaging of other parts of digestive tract: Secondary | ICD-10-CM | POA: Diagnosis not present

## 2014-09-15 DIAGNOSIS — Z86010 Personal history of colonic polyps: Secondary | ICD-10-CM | POA: Diagnosis not present

## 2014-09-27 DIAGNOSIS — K219 Gastro-esophageal reflux disease without esophagitis: Secondary | ICD-10-CM | POA: Diagnosis not present

## 2014-09-27 DIAGNOSIS — E780 Pure hypercholesterolemia: Secondary | ICD-10-CM | POA: Diagnosis not present

## 2014-09-27 DIAGNOSIS — Z6835 Body mass index (BMI) 35.0-35.9, adult: Secondary | ICD-10-CM | POA: Diagnosis not present

## 2014-09-27 DIAGNOSIS — Z9181 History of falling: Secondary | ICD-10-CM | POA: Diagnosis not present

## 2014-09-27 DIAGNOSIS — E785 Hyperlipidemia, unspecified: Secondary | ICD-10-CM | POA: Diagnosis not present

## 2014-09-27 DIAGNOSIS — M545 Low back pain: Secondary | ICD-10-CM | POA: Diagnosis not present

## 2014-09-27 DIAGNOSIS — Z8546 Personal history of malignant neoplasm of prostate: Secondary | ICD-10-CM | POA: Diagnosis not present

## 2014-09-27 DIAGNOSIS — E119 Type 2 diabetes mellitus without complications: Secondary | ICD-10-CM | POA: Diagnosis not present

## 2014-10-27 DIAGNOSIS — K6389 Other specified diseases of intestine: Secondary | ICD-10-CM | POA: Diagnosis not present

## 2014-10-27 DIAGNOSIS — Z86010 Personal history of colonic polyps: Secondary | ICD-10-CM | POA: Diagnosis not present

## 2014-10-27 DIAGNOSIS — K2970 Gastritis, unspecified, without bleeding: Secondary | ICD-10-CM | POA: Diagnosis not present

## 2014-10-27 DIAGNOSIS — R933 Abnormal findings on diagnostic imaging of other parts of digestive tract: Secondary | ICD-10-CM | POA: Diagnosis not present

## 2014-10-27 DIAGNOSIS — Z1211 Encounter for screening for malignant neoplasm of colon: Secondary | ICD-10-CM | POA: Diagnosis not present

## 2014-10-27 DIAGNOSIS — D124 Benign neoplasm of descending colon: Secondary | ICD-10-CM | POA: Diagnosis not present

## 2014-10-27 DIAGNOSIS — K317 Polyp of stomach and duodenum: Secondary | ICD-10-CM | POA: Diagnosis not present

## 2014-10-27 DIAGNOSIS — K3189 Other diseases of stomach and duodenum: Secondary | ICD-10-CM | POA: Diagnosis not present

## 2014-10-27 DIAGNOSIS — K635 Polyp of colon: Secondary | ICD-10-CM | POA: Diagnosis not present

## 2014-11-10 DIAGNOSIS — I493 Ventricular premature depolarization: Secondary | ICD-10-CM | POA: Diagnosis not present

## 2014-11-10 DIAGNOSIS — E119 Type 2 diabetes mellitus without complications: Secondary | ICD-10-CM

## 2014-11-10 DIAGNOSIS — I739 Peripheral vascular disease, unspecified: Secondary | ICD-10-CM

## 2014-11-10 DIAGNOSIS — Z794 Long term (current) use of insulin: Secondary | ICD-10-CM

## 2014-11-10 DIAGNOSIS — K3189 Other diseases of stomach and duodenum: Secondary | ICD-10-CM

## 2014-11-10 DIAGNOSIS — Z01810 Encounter for preprocedural cardiovascular examination: Secondary | ICD-10-CM | POA: Diagnosis not present

## 2014-11-10 DIAGNOSIS — I10 Essential (primary) hypertension: Secondary | ICD-10-CM

## 2014-11-10 HISTORY — DX: Peripheral vascular disease, unspecified: I73.9

## 2014-11-10 HISTORY — DX: Other diseases of stomach and duodenum: K31.89

## 2014-11-10 HISTORY — DX: Type 2 diabetes mellitus without complications: Z79.4

## 2014-11-10 HISTORY — DX: Essential (primary) hypertension: I10

## 2014-11-10 HISTORY — DX: Type 2 diabetes mellitus without complications: E11.9

## 2014-11-17 DIAGNOSIS — J449 Chronic obstructive pulmonary disease, unspecified: Secondary | ICD-10-CM | POA: Diagnosis not present

## 2014-11-17 DIAGNOSIS — Z7982 Long term (current) use of aspirin: Secondary | ICD-10-CM | POA: Diagnosis not present

## 2014-11-17 DIAGNOSIS — Z79899 Other long term (current) drug therapy: Secondary | ICD-10-CM | POA: Diagnosis not present

## 2014-11-17 DIAGNOSIS — I10 Essential (primary) hypertension: Secondary | ICD-10-CM | POA: Diagnosis not present

## 2014-11-17 DIAGNOSIS — Z923 Personal history of irradiation: Secondary | ICD-10-CM | POA: Diagnosis not present

## 2014-11-17 DIAGNOSIS — Z794 Long term (current) use of insulin: Secondary | ICD-10-CM | POA: Diagnosis not present

## 2014-11-17 DIAGNOSIS — K3189 Other diseases of stomach and duodenum: Secondary | ICD-10-CM | POA: Diagnosis not present

## 2014-11-17 DIAGNOSIS — I739 Peripheral vascular disease, unspecified: Secondary | ICD-10-CM | POA: Diagnosis not present

## 2014-11-17 DIAGNOSIS — K319 Disease of stomach and duodenum, unspecified: Secondary | ICD-10-CM | POA: Diagnosis not present

## 2014-11-17 DIAGNOSIS — Z87891 Personal history of nicotine dependence: Secondary | ICD-10-CM | POA: Diagnosis not present

## 2014-11-17 DIAGNOSIS — Z86010 Personal history of colonic polyps: Secondary | ICD-10-CM | POA: Diagnosis not present

## 2014-11-17 DIAGNOSIS — K219 Gastro-esophageal reflux disease without esophagitis: Secondary | ICD-10-CM | POA: Diagnosis not present

## 2014-11-17 DIAGNOSIS — E119 Type 2 diabetes mellitus without complications: Secondary | ICD-10-CM | POA: Diagnosis not present

## 2014-11-17 DIAGNOSIS — I5030 Unspecified diastolic (congestive) heart failure: Secondary | ICD-10-CM | POA: Diagnosis not present

## 2014-11-17 DIAGNOSIS — Z8546 Personal history of malignant neoplasm of prostate: Secondary | ICD-10-CM | POA: Diagnosis not present

## 2014-11-17 DIAGNOSIS — R112 Nausea with vomiting, unspecified: Secondary | ICD-10-CM | POA: Diagnosis not present

## 2014-12-27 DIAGNOSIS — E785 Hyperlipidemia, unspecified: Secondary | ICD-10-CM | POA: Diagnosis not present

## 2014-12-27 DIAGNOSIS — I10 Essential (primary) hypertension: Secondary | ICD-10-CM | POA: Diagnosis not present

## 2014-12-27 DIAGNOSIS — E119 Type 2 diabetes mellitus without complications: Secondary | ICD-10-CM | POA: Diagnosis not present

## 2014-12-29 DIAGNOSIS — Z23 Encounter for immunization: Secondary | ICD-10-CM | POA: Diagnosis not present

## 2014-12-29 DIAGNOSIS — Z6835 Body mass index (BMI) 35.0-35.9, adult: Secondary | ICD-10-CM | POA: Diagnosis not present

## 2014-12-29 DIAGNOSIS — E119 Type 2 diabetes mellitus without complications: Secondary | ICD-10-CM | POA: Diagnosis not present

## 2014-12-29 DIAGNOSIS — I10 Essential (primary) hypertension: Secondary | ICD-10-CM | POA: Diagnosis not present

## 2014-12-29 DIAGNOSIS — M545 Low back pain: Secondary | ICD-10-CM | POA: Diagnosis not present

## 2014-12-29 DIAGNOSIS — E785 Hyperlipidemia, unspecified: Secondary | ICD-10-CM | POA: Diagnosis not present

## 2014-12-29 DIAGNOSIS — J449 Chronic obstructive pulmonary disease, unspecified: Secondary | ICD-10-CM | POA: Diagnosis not present

## 2015-03-29 DIAGNOSIS — Z6834 Body mass index (BMI) 34.0-34.9, adult: Secondary | ICD-10-CM | POA: Diagnosis not present

## 2015-03-29 DIAGNOSIS — M545 Low back pain: Secondary | ICD-10-CM | POA: Diagnosis not present

## 2015-03-29 DIAGNOSIS — I10 Essential (primary) hypertension: Secondary | ICD-10-CM | POA: Diagnosis not present

## 2015-03-29 DIAGNOSIS — E785 Hyperlipidemia, unspecified: Secondary | ICD-10-CM | POA: Diagnosis not present

## 2015-03-29 DIAGNOSIS — E119 Type 2 diabetes mellitus without complications: Secondary | ICD-10-CM | POA: Diagnosis not present

## 2015-06-28 DIAGNOSIS — E669 Obesity, unspecified: Secondary | ICD-10-CM | POA: Diagnosis not present

## 2015-06-28 DIAGNOSIS — Z6835 Body mass index (BMI) 35.0-35.9, adult: Secondary | ICD-10-CM | POA: Diagnosis not present

## 2015-06-28 DIAGNOSIS — E119 Type 2 diabetes mellitus without complications: Secondary | ICD-10-CM | POA: Diagnosis not present

## 2015-06-28 DIAGNOSIS — R350 Frequency of micturition: Secondary | ICD-10-CM | POA: Diagnosis not present

## 2015-06-28 DIAGNOSIS — Z0000 Encounter for general adult medical examination without abnormal findings: Secondary | ICD-10-CM | POA: Diagnosis not present

## 2015-06-28 DIAGNOSIS — Z1389 Encounter for screening for other disorder: Secondary | ICD-10-CM | POA: Diagnosis not present

## 2015-06-28 DIAGNOSIS — E785 Hyperlipidemia, unspecified: Secondary | ICD-10-CM | POA: Diagnosis not present

## 2015-06-28 DIAGNOSIS — Z9181 History of falling: Secondary | ICD-10-CM | POA: Diagnosis not present

## 2015-07-20 DIAGNOSIS — Z95820 Peripheral vascular angioplasty status with implants and grafts: Secondary | ICD-10-CM | POA: Diagnosis not present

## 2015-07-20 DIAGNOSIS — I714 Abdominal aortic aneurysm, without rupture: Secondary | ICD-10-CM | POA: Diagnosis not present

## 2015-07-20 DIAGNOSIS — I6523 Occlusion and stenosis of bilateral carotid arteries: Secondary | ICD-10-CM | POA: Diagnosis not present

## 2015-07-20 DIAGNOSIS — I719 Aortic aneurysm of unspecified site, without rupture: Secondary | ICD-10-CM | POA: Diagnosis not present

## 2015-08-03 DIAGNOSIS — I6523 Occlusion and stenosis of bilateral carotid arteries: Secondary | ICD-10-CM | POA: Insufficient documentation

## 2015-08-03 DIAGNOSIS — I714 Abdominal aortic aneurysm, without rupture, unspecified: Secondary | ICD-10-CM

## 2015-08-03 DIAGNOSIS — I70203 Unspecified atherosclerosis of native arteries of extremities, bilateral legs: Secondary | ICD-10-CM

## 2015-08-03 DIAGNOSIS — I739 Peripheral vascular disease, unspecified: Secondary | ICD-10-CM | POA: Diagnosis not present

## 2015-08-03 HISTORY — DX: Abdominal aortic aneurysm, without rupture, unspecified: I71.40

## 2015-08-03 HISTORY — DX: Abdominal aortic aneurysm, without rupture: I71.4

## 2015-08-03 HISTORY — DX: Occlusion and stenosis of bilateral carotid arteries: I65.23

## 2015-08-03 HISTORY — DX: Unspecified atherosclerosis of native arteries of extremities, bilateral legs: I70.203

## 2015-08-25 DIAGNOSIS — G4734 Idiopathic sleep related nonobstructive alveolar hypoventilation: Secondary | ICD-10-CM | POA: Diagnosis not present

## 2015-08-25 DIAGNOSIS — J449 Chronic obstructive pulmonary disease, unspecified: Secondary | ICD-10-CM | POA: Diagnosis not present

## 2015-08-25 DIAGNOSIS — E669 Obesity, unspecified: Secondary | ICD-10-CM | POA: Diagnosis not present

## 2015-09-06 DIAGNOSIS — M1712 Unilateral primary osteoarthritis, left knee: Secondary | ICD-10-CM | POA: Diagnosis not present

## 2015-10-17 DIAGNOSIS — M545 Low back pain: Secondary | ICD-10-CM | POA: Diagnosis not present

## 2015-10-17 DIAGNOSIS — E119 Type 2 diabetes mellitus without complications: Secondary | ICD-10-CM | POA: Diagnosis not present

## 2015-10-17 DIAGNOSIS — Z23 Encounter for immunization: Secondary | ICD-10-CM | POA: Diagnosis not present

## 2015-10-17 DIAGNOSIS — I10 Essential (primary) hypertension: Secondary | ICD-10-CM | POA: Diagnosis not present

## 2015-10-17 DIAGNOSIS — E785 Hyperlipidemia, unspecified: Secondary | ICD-10-CM | POA: Diagnosis not present

## 2015-10-25 DIAGNOSIS — Z0000 Encounter for general adult medical examination without abnormal findings: Secondary | ICD-10-CM | POA: Diagnosis not present

## 2015-12-27 DIAGNOSIS — M1712 Unilateral primary osteoarthritis, left knee: Secondary | ICD-10-CM | POA: Diagnosis not present

## 2015-12-28 DIAGNOSIS — M79609 Pain in unspecified limb: Secondary | ICD-10-CM | POA: Diagnosis not present

## 2015-12-28 DIAGNOSIS — R52 Pain, unspecified: Secondary | ICD-10-CM | POA: Diagnosis not present

## 2015-12-28 DIAGNOSIS — Z01818 Encounter for other preprocedural examination: Secondary | ICD-10-CM | POA: Diagnosis not present

## 2015-12-28 DIAGNOSIS — J449 Chronic obstructive pulmonary disease, unspecified: Secondary | ICD-10-CM | POA: Diagnosis not present

## 2015-12-28 DIAGNOSIS — Z79899 Other long term (current) drug therapy: Secondary | ICD-10-CM | POA: Diagnosis not present

## 2015-12-28 DIAGNOSIS — Z01810 Encounter for preprocedural cardiovascular examination: Secondary | ICD-10-CM | POA: Diagnosis not present

## 2015-12-28 DIAGNOSIS — E559 Vitamin D deficiency, unspecified: Secondary | ICD-10-CM | POA: Diagnosis not present

## 2016-01-04 DIAGNOSIS — Z23 Encounter for immunization: Secondary | ICD-10-CM | POA: Diagnosis not present

## 2016-01-04 DIAGNOSIS — M1712 Unilateral primary osteoarthritis, left knee: Secondary | ICD-10-CM | POA: Diagnosis not present

## 2016-01-04 DIAGNOSIS — Z01818 Encounter for other preprocedural examination: Secondary | ICD-10-CM | POA: Diagnosis not present

## 2016-01-04 DIAGNOSIS — Z6836 Body mass index (BMI) 36.0-36.9, adult: Secondary | ICD-10-CM | POA: Diagnosis not present

## 2016-01-04 DIAGNOSIS — G894 Chronic pain syndrome: Secondary | ICD-10-CM | POA: Diagnosis not present

## 2016-01-04 DIAGNOSIS — E785 Hyperlipidemia, unspecified: Secondary | ICD-10-CM | POA: Diagnosis not present

## 2016-01-04 DIAGNOSIS — E7800 Pure hypercholesterolemia, unspecified: Secondary | ICD-10-CM | POA: Diagnosis not present

## 2016-01-04 DIAGNOSIS — E669 Obesity, unspecified: Secondary | ICD-10-CM | POA: Diagnosis not present

## 2016-01-04 DIAGNOSIS — E1169 Type 2 diabetes mellitus with other specified complication: Secondary | ICD-10-CM | POA: Diagnosis not present

## 2016-01-04 DIAGNOSIS — I10 Essential (primary) hypertension: Secondary | ICD-10-CM | POA: Diagnosis not present

## 2016-01-05 DIAGNOSIS — J449 Chronic obstructive pulmonary disease, unspecified: Secondary | ICD-10-CM | POA: Diagnosis not present

## 2016-01-05 DIAGNOSIS — E669 Obesity, unspecified: Secondary | ICD-10-CM | POA: Diagnosis not present

## 2016-01-23 DIAGNOSIS — Z7982 Long term (current) use of aspirin: Secondary | ICD-10-CM | POA: Diagnosis not present

## 2016-01-23 DIAGNOSIS — R531 Weakness: Secondary | ICD-10-CM | POA: Diagnosis not present

## 2016-01-23 DIAGNOSIS — E7800 Pure hypercholesterolemia, unspecified: Secondary | ICD-10-CM | POA: Diagnosis present

## 2016-01-23 DIAGNOSIS — Z87891 Personal history of nicotine dependence: Secondary | ICD-10-CM | POA: Diagnosis not present

## 2016-01-23 DIAGNOSIS — I10 Essential (primary) hypertension: Secondary | ICD-10-CM | POA: Diagnosis not present

## 2016-01-23 DIAGNOSIS — M6281 Muscle weakness (generalized): Secondary | ICD-10-CM | POA: Diagnosis not present

## 2016-01-23 DIAGNOSIS — Z4789 Encounter for other orthopedic aftercare: Secondary | ICD-10-CM | POA: Diagnosis not present

## 2016-01-23 DIAGNOSIS — Z96652 Presence of left artificial knee joint: Secondary | ICD-10-CM | POA: Diagnosis not present

## 2016-01-23 DIAGNOSIS — M25569 Pain in unspecified knee: Secondary | ICD-10-CM | POA: Diagnosis not present

## 2016-01-23 DIAGNOSIS — R2681 Unsteadiness on feet: Secondary | ICD-10-CM | POA: Diagnosis not present

## 2016-01-23 DIAGNOSIS — M1712 Unilateral primary osteoarthritis, left knee: Secondary | ICD-10-CM | POA: Diagnosis not present

## 2016-01-23 DIAGNOSIS — J449 Chronic obstructive pulmonary disease, unspecified: Secondary | ICD-10-CM | POA: Diagnosis not present

## 2016-01-23 DIAGNOSIS — K219 Gastro-esophageal reflux disease without esophagitis: Secondary | ICD-10-CM | POA: Diagnosis present

## 2016-01-23 DIAGNOSIS — Z471 Aftercare following joint replacement surgery: Secondary | ICD-10-CM | POA: Diagnosis not present

## 2016-01-23 DIAGNOSIS — Z79899 Other long term (current) drug therapy: Secondary | ICD-10-CM | POA: Diagnosis not present

## 2016-01-23 DIAGNOSIS — G8929 Other chronic pain: Secondary | ICD-10-CM | POA: Diagnosis not present

## 2016-01-26 DIAGNOSIS — M25569 Pain in unspecified knee: Secondary | ICD-10-CM | POA: Diagnosis not present

## 2016-01-26 DIAGNOSIS — J449 Chronic obstructive pulmonary disease, unspecified: Secondary | ICD-10-CM | POA: Diagnosis not present

## 2016-01-26 DIAGNOSIS — Z96652 Presence of left artificial knee joint: Secondary | ICD-10-CM | POA: Diagnosis not present

## 2016-01-26 DIAGNOSIS — M6281 Muscle weakness (generalized): Secondary | ICD-10-CM | POA: Diagnosis not present

## 2016-01-26 DIAGNOSIS — E7800 Pure hypercholesterolemia, unspecified: Secondary | ICD-10-CM | POA: Diagnosis not present

## 2016-01-26 DIAGNOSIS — R2681 Unsteadiness on feet: Secondary | ICD-10-CM | POA: Diagnosis not present

## 2016-01-26 DIAGNOSIS — M1712 Unilateral primary osteoarthritis, left knee: Secondary | ICD-10-CM | POA: Diagnosis not present

## 2016-01-26 DIAGNOSIS — Z4789 Encounter for other orthopedic aftercare: Secondary | ICD-10-CM | POA: Diagnosis not present

## 2016-01-26 DIAGNOSIS — G4700 Insomnia, unspecified: Secondary | ICD-10-CM | POA: Diagnosis not present

## 2016-01-26 DIAGNOSIS — R531 Weakness: Secondary | ICD-10-CM | POA: Diagnosis not present

## 2016-01-26 DIAGNOSIS — Z760 Encounter for issue of repeat prescription: Secondary | ICD-10-CM | POA: Diagnosis not present

## 2016-01-26 DIAGNOSIS — G8929 Other chronic pain: Secondary | ICD-10-CM | POA: Diagnosis not present

## 2016-01-27 DIAGNOSIS — G8929 Other chronic pain: Secondary | ICD-10-CM | POA: Diagnosis not present

## 2016-01-27 DIAGNOSIS — Z4789 Encounter for other orthopedic aftercare: Secondary | ICD-10-CM | POA: Diagnosis not present

## 2016-01-27 DIAGNOSIS — J449 Chronic obstructive pulmonary disease, unspecified: Secondary | ICD-10-CM | POA: Diagnosis not present

## 2016-01-27 DIAGNOSIS — M1712 Unilateral primary osteoarthritis, left knee: Secondary | ICD-10-CM | POA: Diagnosis not present

## 2016-01-31 DIAGNOSIS — Z760 Encounter for issue of repeat prescription: Secondary | ICD-10-CM | POA: Diagnosis not present

## 2016-01-31 DIAGNOSIS — E7800 Pure hypercholesterolemia, unspecified: Secondary | ICD-10-CM | POA: Diagnosis not present

## 2016-01-31 DIAGNOSIS — G4700 Insomnia, unspecified: Secondary | ICD-10-CM | POA: Diagnosis not present

## 2016-01-31 DIAGNOSIS — G8929 Other chronic pain: Secondary | ICD-10-CM | POA: Diagnosis not present

## 2016-02-29 DIAGNOSIS — E1169 Type 2 diabetes mellitus with other specified complication: Secondary | ICD-10-CM | POA: Diagnosis not present

## 2016-02-29 DIAGNOSIS — I10 Essential (primary) hypertension: Secondary | ICD-10-CM | POA: Diagnosis not present

## 2016-02-29 DIAGNOSIS — M109 Gout, unspecified: Secondary | ICD-10-CM | POA: Diagnosis not present

## 2016-02-29 DIAGNOSIS — Z79899 Other long term (current) drug therapy: Secondary | ICD-10-CM | POA: Diagnosis not present

## 2016-02-29 DIAGNOSIS — M1712 Unilateral primary osteoarthritis, left knee: Secondary | ICD-10-CM | POA: Diagnosis not present

## 2016-02-29 DIAGNOSIS — E785 Hyperlipidemia, unspecified: Secondary | ICD-10-CM | POA: Diagnosis not present

## 2016-02-29 DIAGNOSIS — E669 Obesity, unspecified: Secondary | ICD-10-CM | POA: Diagnosis not present

## 2016-02-29 DIAGNOSIS — Z87898 Personal history of other specified conditions: Secondary | ICD-10-CM | POA: Diagnosis not present

## 2016-03-20 DIAGNOSIS — M961 Postlaminectomy syndrome, not elsewhere classified: Secondary | ICD-10-CM | POA: Diagnosis not present

## 2016-03-20 DIAGNOSIS — G8929 Other chronic pain: Secondary | ICD-10-CM | POA: Diagnosis not present

## 2016-03-20 DIAGNOSIS — M25562 Pain in left knee: Secondary | ICD-10-CM | POA: Diagnosis not present

## 2016-04-04 DIAGNOSIS — M1712 Unilateral primary osteoarthritis, left knee: Secondary | ICD-10-CM | POA: Diagnosis not present

## 2016-04-16 DIAGNOSIS — M545 Low back pain: Secondary | ICD-10-CM | POA: Diagnosis not present

## 2016-04-16 DIAGNOSIS — M961 Postlaminectomy syndrome, not elsewhere classified: Secondary | ICD-10-CM | POA: Diagnosis not present

## 2016-04-16 DIAGNOSIS — M25562 Pain in left knee: Secondary | ICD-10-CM | POA: Diagnosis not present

## 2016-04-16 DIAGNOSIS — G8929 Other chronic pain: Secondary | ICD-10-CM | POA: Diagnosis not present

## 2016-05-14 DIAGNOSIS — M961 Postlaminectomy syndrome, not elsewhere classified: Secondary | ICD-10-CM | POA: Diagnosis not present

## 2016-05-14 DIAGNOSIS — G8929 Other chronic pain: Secondary | ICD-10-CM | POA: Diagnosis not present

## 2016-05-14 DIAGNOSIS — M25562 Pain in left knee: Secondary | ICD-10-CM | POA: Diagnosis not present

## 2016-05-14 DIAGNOSIS — M545 Low back pain: Secondary | ICD-10-CM | POA: Diagnosis not present

## 2016-06-13 DIAGNOSIS — M545 Low back pain: Secondary | ICD-10-CM | POA: Diagnosis not present

## 2016-06-13 DIAGNOSIS — Z1389 Encounter for screening for other disorder: Secondary | ICD-10-CM | POA: Diagnosis not present

## 2016-06-13 DIAGNOSIS — M961 Postlaminectomy syndrome, not elsewhere classified: Secondary | ICD-10-CM | POA: Diagnosis not present

## 2016-06-13 DIAGNOSIS — M25562 Pain in left knee: Secondary | ICD-10-CM | POA: Diagnosis not present

## 2016-06-13 DIAGNOSIS — G8929 Other chronic pain: Secondary | ICD-10-CM | POA: Diagnosis not present

## 2016-06-28 DIAGNOSIS — Z125 Encounter for screening for malignant neoplasm of prostate: Secondary | ICD-10-CM | POA: Diagnosis not present

## 2016-06-28 DIAGNOSIS — I10 Essential (primary) hypertension: Secondary | ICD-10-CM | POA: Diagnosis not present

## 2016-06-28 DIAGNOSIS — Z6835 Body mass index (BMI) 35.0-35.9, adult: Secondary | ICD-10-CM | POA: Diagnosis not present

## 2016-06-28 DIAGNOSIS — E1169 Type 2 diabetes mellitus with other specified complication: Secondary | ICD-10-CM | POA: Diagnosis not present

## 2016-06-28 DIAGNOSIS — E785 Hyperlipidemia, unspecified: Secondary | ICD-10-CM | POA: Diagnosis not present

## 2016-06-28 DIAGNOSIS — E669 Obesity, unspecified: Secondary | ICD-10-CM | POA: Diagnosis not present

## 2016-06-28 DIAGNOSIS — Z0000 Encounter for general adult medical examination without abnormal findings: Secondary | ICD-10-CM | POA: Diagnosis not present

## 2016-06-28 DIAGNOSIS — Z79899 Other long term (current) drug therapy: Secondary | ICD-10-CM | POA: Diagnosis not present

## 2016-07-06 DIAGNOSIS — M1712 Unilateral primary osteoarthritis, left knee: Secondary | ICD-10-CM | POA: Diagnosis not present

## 2016-07-11 DIAGNOSIS — M961 Postlaminectomy syndrome, not elsewhere classified: Secondary | ICD-10-CM | POA: Diagnosis not present

## 2016-07-11 DIAGNOSIS — G8929 Other chronic pain: Secondary | ICD-10-CM | POA: Diagnosis not present

## 2016-07-11 DIAGNOSIS — Z9119 Patient's noncompliance with other medical treatment and regimen: Secondary | ICD-10-CM | POA: Diagnosis not present

## 2016-07-11 DIAGNOSIS — M25562 Pain in left knee: Secondary | ICD-10-CM | POA: Diagnosis not present

## 2016-08-08 DIAGNOSIS — G8929 Other chronic pain: Secondary | ICD-10-CM | POA: Diagnosis not present

## 2016-08-08 DIAGNOSIS — M961 Postlaminectomy syndrome, not elsewhere classified: Secondary | ICD-10-CM | POA: Diagnosis not present

## 2016-08-09 DIAGNOSIS — Z95820 Peripheral vascular angioplasty status with implants and grafts: Secondary | ICD-10-CM | POA: Diagnosis not present

## 2016-08-09 DIAGNOSIS — I6523 Occlusion and stenosis of bilateral carotid arteries: Secondary | ICD-10-CM | POA: Diagnosis not present

## 2016-08-09 DIAGNOSIS — I714 Abdominal aortic aneurysm, without rupture: Secondary | ICD-10-CM | POA: Diagnosis not present

## 2016-08-29 DIAGNOSIS — M109 Gout, unspecified: Secondary | ICD-10-CM | POA: Diagnosis not present

## 2016-08-29 DIAGNOSIS — Z1389 Encounter for screening for other disorder: Secondary | ICD-10-CM | POA: Diagnosis not present

## 2016-08-29 DIAGNOSIS — Z79899 Other long term (current) drug therapy: Secondary | ICD-10-CM | POA: Diagnosis not present

## 2016-08-29 DIAGNOSIS — I10 Essential (primary) hypertension: Secondary | ICD-10-CM | POA: Diagnosis not present

## 2016-08-29 DIAGNOSIS — Z9181 History of falling: Secondary | ICD-10-CM | POA: Diagnosis not present

## 2016-08-29 DIAGNOSIS — E1169 Type 2 diabetes mellitus with other specified complication: Secondary | ICD-10-CM | POA: Diagnosis not present

## 2016-08-29 DIAGNOSIS — E785 Hyperlipidemia, unspecified: Secondary | ICD-10-CM | POA: Diagnosis not present

## 2016-09-19 DIAGNOSIS — M961 Postlaminectomy syndrome, not elsewhere classified: Secondary | ICD-10-CM | POA: Diagnosis not present

## 2016-09-19 DIAGNOSIS — G8929 Other chronic pain: Secondary | ICD-10-CM | POA: Diagnosis not present

## 2016-09-24 DIAGNOSIS — Z23 Encounter for immunization: Secondary | ICD-10-CM | POA: Diagnosis not present

## 2016-09-24 DIAGNOSIS — J449 Chronic obstructive pulmonary disease, unspecified: Secondary | ICD-10-CM | POA: Diagnosis not present

## 2016-09-27 DIAGNOSIS — I6523 Occlusion and stenosis of bilateral carotid arteries: Secondary | ICD-10-CM | POA: Diagnosis not present

## 2016-09-27 DIAGNOSIS — I70203 Unspecified atherosclerosis of native arteries of extremities, bilateral legs: Secondary | ICD-10-CM | POA: Diagnosis not present

## 2016-09-27 DIAGNOSIS — I714 Abdominal aortic aneurysm, without rupture: Secondary | ICD-10-CM | POA: Diagnosis not present

## 2016-10-10 DIAGNOSIS — G8929 Other chronic pain: Secondary | ICD-10-CM | POA: Diagnosis not present

## 2016-10-10 DIAGNOSIS — M961 Postlaminectomy syndrome, not elsewhere classified: Secondary | ICD-10-CM | POA: Diagnosis not present

## 2016-11-14 DIAGNOSIS — M961 Postlaminectomy syndrome, not elsewhere classified: Secondary | ICD-10-CM | POA: Diagnosis not present

## 2016-11-14 DIAGNOSIS — Z79891 Long term (current) use of opiate analgesic: Secondary | ICD-10-CM | POA: Diagnosis not present

## 2016-11-14 DIAGNOSIS — G8929 Other chronic pain: Secondary | ICD-10-CM | POA: Diagnosis not present

## 2016-11-19 DIAGNOSIS — N401 Enlarged prostate with lower urinary tract symptoms: Secondary | ICD-10-CM | POA: Diagnosis not present

## 2016-11-19 DIAGNOSIS — N481 Balanitis: Secondary | ICD-10-CM | POA: Diagnosis not present

## 2016-11-19 DIAGNOSIS — C61 Malignant neoplasm of prostate: Secondary | ICD-10-CM | POA: Diagnosis not present

## 2016-12-03 DIAGNOSIS — E1169 Type 2 diabetes mellitus with other specified complication: Secondary | ICD-10-CM | POA: Diagnosis not present

## 2016-12-03 DIAGNOSIS — I10 Essential (primary) hypertension: Secondary | ICD-10-CM | POA: Diagnosis not present

## 2016-12-03 DIAGNOSIS — Z79899 Other long term (current) drug therapy: Secondary | ICD-10-CM | POA: Diagnosis not present

## 2016-12-03 DIAGNOSIS — E785 Hyperlipidemia, unspecified: Secondary | ICD-10-CM | POA: Diagnosis not present

## 2016-12-03 DIAGNOSIS — M109 Gout, unspecified: Secondary | ICD-10-CM | POA: Diagnosis not present

## 2016-12-18 DIAGNOSIS — M961 Postlaminectomy syndrome, not elsewhere classified: Secondary | ICD-10-CM | POA: Diagnosis not present

## 2016-12-18 DIAGNOSIS — G8929 Other chronic pain: Secondary | ICD-10-CM | POA: Diagnosis not present

## 2016-12-19 DIAGNOSIS — N3289 Other specified disorders of bladder: Secondary | ICD-10-CM | POA: Diagnosis not present

## 2016-12-19 DIAGNOSIS — C61 Malignant neoplasm of prostate: Secondary | ICD-10-CM | POA: Diagnosis not present

## 2016-12-19 DIAGNOSIS — N481 Balanitis: Secondary | ICD-10-CM | POA: Diagnosis not present

## 2017-01-09 DIAGNOSIS — M65342 Trigger finger, left ring finger: Secondary | ICD-10-CM | POA: Diagnosis not present

## 2017-01-09 DIAGNOSIS — M1712 Unilateral primary osteoarthritis, left knee: Secondary | ICD-10-CM | POA: Diagnosis not present

## 2017-01-09 DIAGNOSIS — M25342 Other instability, left hand: Secondary | ICD-10-CM | POA: Diagnosis not present

## 2017-01-16 DIAGNOSIS — M961 Postlaminectomy syndrome, not elsewhere classified: Secondary | ICD-10-CM | POA: Diagnosis not present

## 2017-01-16 DIAGNOSIS — G8929 Other chronic pain: Secondary | ICD-10-CM | POA: Diagnosis not present

## 2017-01-21 DIAGNOSIS — N481 Balanitis: Secondary | ICD-10-CM | POA: Diagnosis not present

## 2017-01-21 DIAGNOSIS — C61 Malignant neoplasm of prostate: Secondary | ICD-10-CM | POA: Diagnosis not present

## 2017-01-25 DIAGNOSIS — L03116 Cellulitis of left lower limb: Secondary | ICD-10-CM | POA: Diagnosis not present

## 2017-01-25 DIAGNOSIS — M7989 Other specified soft tissue disorders: Secondary | ICD-10-CM | POA: Diagnosis not present

## 2017-02-01 DIAGNOSIS — L03116 Cellulitis of left lower limb: Secondary | ICD-10-CM | POA: Diagnosis not present

## 2017-02-01 DIAGNOSIS — R600 Localized edema: Secondary | ICD-10-CM | POA: Diagnosis not present

## 2017-02-04 DIAGNOSIS — L03116 Cellulitis of left lower limb: Secondary | ICD-10-CM | POA: Diagnosis not present

## 2017-02-04 DIAGNOSIS — R609 Edema, unspecified: Secondary | ICD-10-CM | POA: Diagnosis not present

## 2017-02-04 DIAGNOSIS — M109 Gout, unspecified: Secondary | ICD-10-CM | POA: Diagnosis not present

## 2017-02-07 DIAGNOSIS — G8929 Other chronic pain: Secondary | ICD-10-CM | POA: Diagnosis not present

## 2017-02-07 DIAGNOSIS — M961 Postlaminectomy syndrome, not elsewhere classified: Secondary | ICD-10-CM | POA: Diagnosis not present

## 2017-02-18 DIAGNOSIS — R609 Edema, unspecified: Secondary | ICD-10-CM | POA: Diagnosis not present

## 2017-02-18 DIAGNOSIS — L03116 Cellulitis of left lower limb: Secondary | ICD-10-CM | POA: Diagnosis not present

## 2017-02-20 DIAGNOSIS — R609 Edema, unspecified: Secondary | ICD-10-CM | POA: Diagnosis not present

## 2017-02-20 DIAGNOSIS — L03116 Cellulitis of left lower limb: Secondary | ICD-10-CM | POA: Diagnosis not present

## 2017-02-20 DIAGNOSIS — M7989 Other specified soft tissue disorders: Secondary | ICD-10-CM | POA: Diagnosis not present

## 2017-02-27 DIAGNOSIS — R600 Localized edema: Secondary | ICD-10-CM | POA: Diagnosis not present

## 2017-02-27 DIAGNOSIS — I8310 Varicose veins of unspecified lower extremity with inflammation: Secondary | ICD-10-CM | POA: Diagnosis not present

## 2017-03-06 DIAGNOSIS — Z23 Encounter for immunization: Secondary | ICD-10-CM | POA: Diagnosis not present

## 2017-03-06 DIAGNOSIS — E1169 Type 2 diabetes mellitus with other specified complication: Secondary | ICD-10-CM | POA: Diagnosis not present

## 2017-03-06 DIAGNOSIS — E785 Hyperlipidemia, unspecified: Secondary | ICD-10-CM | POA: Diagnosis not present

## 2017-03-06 DIAGNOSIS — Z79899 Other long term (current) drug therapy: Secondary | ICD-10-CM | POA: Diagnosis not present

## 2017-03-06 DIAGNOSIS — K219 Gastro-esophageal reflux disease without esophagitis: Secondary | ICD-10-CM | POA: Diagnosis not present

## 2017-03-06 DIAGNOSIS — M109 Gout, unspecified: Secondary | ICD-10-CM | POA: Diagnosis not present

## 2017-03-06 DIAGNOSIS — I10 Essential (primary) hypertension: Secondary | ICD-10-CM | POA: Diagnosis not present

## 2017-03-13 DIAGNOSIS — I8310 Varicose veins of unspecified lower extremity with inflammation: Secondary | ICD-10-CM | POA: Diagnosis not present

## 2017-03-13 DIAGNOSIS — R600 Localized edema: Secondary | ICD-10-CM | POA: Diagnosis not present

## 2017-03-14 DIAGNOSIS — G8929 Other chronic pain: Secondary | ICD-10-CM | POA: Diagnosis not present

## 2017-03-14 DIAGNOSIS — Z79891 Long term (current) use of opiate analgesic: Secondary | ICD-10-CM | POA: Diagnosis not present

## 2017-03-14 DIAGNOSIS — M961 Postlaminectomy syndrome, not elsewhere classified: Secondary | ICD-10-CM | POA: Diagnosis not present

## 2017-04-11 DIAGNOSIS — G8929 Other chronic pain: Secondary | ICD-10-CM | POA: Diagnosis not present

## 2017-04-11 DIAGNOSIS — M961 Postlaminectomy syndrome, not elsewhere classified: Secondary | ICD-10-CM | POA: Diagnosis not present

## 2017-04-24 DIAGNOSIS — E1140 Type 2 diabetes mellitus with diabetic neuropathy, unspecified: Secondary | ICD-10-CM | POA: Diagnosis not present

## 2017-04-24 DIAGNOSIS — M5416 Radiculopathy, lumbar region: Secondary | ICD-10-CM | POA: Diagnosis not present

## 2017-05-09 DIAGNOSIS — M961 Postlaminectomy syndrome, not elsewhere classified: Secondary | ICD-10-CM | POA: Diagnosis not present

## 2017-05-09 DIAGNOSIS — Z79891 Long term (current) use of opiate analgesic: Secondary | ICD-10-CM | POA: Diagnosis not present

## 2017-05-09 DIAGNOSIS — G8929 Other chronic pain: Secondary | ICD-10-CM | POA: Diagnosis not present

## 2017-06-03 DIAGNOSIS — E1169 Type 2 diabetes mellitus with other specified complication: Secondary | ICD-10-CM | POA: Diagnosis not present

## 2017-06-03 DIAGNOSIS — K219 Gastro-esophageal reflux disease without esophagitis: Secondary | ICD-10-CM | POA: Diagnosis not present

## 2017-06-03 DIAGNOSIS — M109 Gout, unspecified: Secondary | ICD-10-CM | POA: Diagnosis not present

## 2017-06-03 DIAGNOSIS — I10 Essential (primary) hypertension: Secondary | ICD-10-CM | POA: Diagnosis not present

## 2017-06-03 DIAGNOSIS — J449 Chronic obstructive pulmonary disease, unspecified: Secondary | ICD-10-CM | POA: Diagnosis not present

## 2017-06-06 DIAGNOSIS — G8929 Other chronic pain: Secondary | ICD-10-CM | POA: Diagnosis not present

## 2017-06-06 DIAGNOSIS — Z79891 Long term (current) use of opiate analgesic: Secondary | ICD-10-CM | POA: Diagnosis not present

## 2017-06-06 DIAGNOSIS — M961 Postlaminectomy syndrome, not elsewhere classified: Secondary | ICD-10-CM | POA: Diagnosis not present

## 2017-06-06 DIAGNOSIS — Z1331 Encounter for screening for depression: Secondary | ICD-10-CM | POA: Diagnosis not present

## 2017-07-04 DIAGNOSIS — M961 Postlaminectomy syndrome, not elsewhere classified: Secondary | ICD-10-CM | POA: Diagnosis not present

## 2017-07-04 DIAGNOSIS — G8929 Other chronic pain: Secondary | ICD-10-CM | POA: Diagnosis not present

## 2017-07-09 DIAGNOSIS — E785 Hyperlipidemia, unspecified: Secondary | ICD-10-CM | POA: Diagnosis not present

## 2017-07-09 DIAGNOSIS — Z125 Encounter for screening for malignant neoplasm of prostate: Secondary | ICD-10-CM | POA: Diagnosis not present

## 2017-07-09 DIAGNOSIS — Z1331 Encounter for screening for depression: Secondary | ICD-10-CM | POA: Diagnosis not present

## 2017-07-09 DIAGNOSIS — Z9181 History of falling: Secondary | ICD-10-CM | POA: Diagnosis not present

## 2017-07-09 DIAGNOSIS — Z0000 Encounter for general adult medical examination without abnormal findings: Secondary | ICD-10-CM | POA: Diagnosis not present

## 2017-07-09 DIAGNOSIS — Z6835 Body mass index (BMI) 35.0-35.9, adult: Secondary | ICD-10-CM | POA: Diagnosis not present

## 2017-08-08 DIAGNOSIS — M961 Postlaminectomy syndrome, not elsewhere classified: Secondary | ICD-10-CM | POA: Diagnosis not present

## 2017-08-08 DIAGNOSIS — G8929 Other chronic pain: Secondary | ICD-10-CM | POA: Diagnosis not present

## 2017-08-21 DIAGNOSIS — E119 Type 2 diabetes mellitus without complications: Secondary | ICD-10-CM | POA: Diagnosis not present

## 2017-09-03 DIAGNOSIS — I10 Essential (primary) hypertension: Secondary | ICD-10-CM | POA: Diagnosis not present

## 2017-09-03 DIAGNOSIS — M109 Gout, unspecified: Secondary | ICD-10-CM | POA: Diagnosis not present

## 2017-09-03 DIAGNOSIS — I509 Heart failure, unspecified: Secondary | ICD-10-CM | POA: Diagnosis not present

## 2017-09-03 DIAGNOSIS — I493 Ventricular premature depolarization: Secondary | ICD-10-CM | POA: Diagnosis not present

## 2017-09-03 DIAGNOSIS — E1169 Type 2 diabetes mellitus with other specified complication: Secondary | ICD-10-CM | POA: Diagnosis not present

## 2017-09-03 DIAGNOSIS — J449 Chronic obstructive pulmonary disease, unspecified: Secondary | ICD-10-CM | POA: Diagnosis not present

## 2017-09-03 DIAGNOSIS — K219 Gastro-esophageal reflux disease without esophagitis: Secondary | ICD-10-CM | POA: Diagnosis not present

## 2017-09-03 DIAGNOSIS — R5383 Other fatigue: Secondary | ICD-10-CM | POA: Diagnosis not present

## 2017-09-03 DIAGNOSIS — I5189 Other ill-defined heart diseases: Secondary | ICD-10-CM | POA: Diagnosis not present

## 2017-09-19 DIAGNOSIS — I509 Heart failure, unspecified: Secondary | ICD-10-CM | POA: Diagnosis not present

## 2017-09-23 DIAGNOSIS — J45909 Unspecified asthma, uncomplicated: Secondary | ICD-10-CM | POA: Insufficient documentation

## 2017-09-23 DIAGNOSIS — I493 Ventricular premature depolarization: Secondary | ICD-10-CM

## 2017-09-23 DIAGNOSIS — E119 Type 2 diabetes mellitus without complications: Secondary | ICD-10-CM | POA: Insufficient documentation

## 2017-09-23 DIAGNOSIS — I5189 Other ill-defined heart diseases: Secondary | ICD-10-CM

## 2017-09-23 DIAGNOSIS — E785 Hyperlipidemia, unspecified: Secondary | ICD-10-CM | POA: Insufficient documentation

## 2017-09-23 DIAGNOSIS — M109 Gout, unspecified: Secondary | ICD-10-CM | POA: Insufficient documentation

## 2017-09-23 DIAGNOSIS — R5383 Other fatigue: Secondary | ICD-10-CM

## 2017-09-23 DIAGNOSIS — I119 Hypertensive heart disease without heart failure: Secondary | ICD-10-CM

## 2017-09-23 DIAGNOSIS — Z8546 Personal history of malignant neoplasm of prostate: Secondary | ICD-10-CM

## 2017-09-23 DIAGNOSIS — J449 Chronic obstructive pulmonary disease, unspecified: Secondary | ICD-10-CM | POA: Insufficient documentation

## 2017-09-23 DIAGNOSIS — I509 Heart failure, unspecified: Secondary | ICD-10-CM

## 2017-09-23 DIAGNOSIS — K219 Gastro-esophageal reflux disease without esophagitis: Secondary | ICD-10-CM | POA: Insufficient documentation

## 2017-09-23 DIAGNOSIS — I10 Essential (primary) hypertension: Secondary | ICD-10-CM

## 2017-09-23 HISTORY — DX: Other ill-defined heart diseases: I51.89

## 2017-09-23 HISTORY — DX: Personal history of malignant neoplasm of prostate: Z85.46

## 2017-09-23 HISTORY — DX: Ventricular premature depolarization: I49.3

## 2017-09-23 HISTORY — DX: Gout, unspecified: M10.9

## 2017-09-23 HISTORY — DX: Type 2 diabetes mellitus without complications: E11.9

## 2017-09-23 HISTORY — DX: Other fatigue: R53.83

## 2017-09-23 HISTORY — DX: Essential (primary) hypertension: I10

## 2017-09-23 HISTORY — DX: Gastro-esophageal reflux disease without esophagitis: K21.9

## 2017-09-23 HISTORY — DX: Unspecified asthma, uncomplicated: J45.909

## 2017-09-23 HISTORY — DX: Heart failure, unspecified: I50.9

## 2017-09-23 HISTORY — DX: Hyperlipidemia, unspecified: E78.5

## 2017-09-23 HISTORY — DX: Hypertensive heart disease without heart failure: I11.9

## 2017-09-23 HISTORY — DX: Chronic obstructive pulmonary disease, unspecified: J44.9

## 2017-09-24 DIAGNOSIS — M961 Postlaminectomy syndrome, not elsewhere classified: Secondary | ICD-10-CM | POA: Diagnosis not present

## 2017-09-24 DIAGNOSIS — G8929 Other chronic pain: Secondary | ICD-10-CM | POA: Diagnosis not present

## 2017-09-27 DIAGNOSIS — E785 Hyperlipidemia, unspecified: Secondary | ICD-10-CM | POA: Diagnosis present

## 2017-09-27 DIAGNOSIS — R079 Chest pain, unspecified: Secondary | ICD-10-CM | POA: Diagnosis present

## 2017-09-27 DIAGNOSIS — E1122 Type 2 diabetes mellitus with diabetic chronic kidney disease: Secondary | ICD-10-CM | POA: Diagnosis not present

## 2017-09-27 DIAGNOSIS — I5042 Chronic combined systolic (congestive) and diastolic (congestive) heart failure: Secondary | ICD-10-CM | POA: Diagnosis present

## 2017-09-27 DIAGNOSIS — Z87891 Personal history of nicotine dependence: Secondary | ICD-10-CM | POA: Diagnosis not present

## 2017-09-27 DIAGNOSIS — I714 Abdominal aortic aneurysm, without rupture: Secondary | ICD-10-CM | POA: Diagnosis present

## 2017-09-27 DIAGNOSIS — I130 Hypertensive heart and chronic kidney disease with heart failure and stage 1 through stage 4 chronic kidney disease, or unspecified chronic kidney disease: Secondary | ICD-10-CM | POA: Diagnosis present

## 2017-09-27 DIAGNOSIS — I739 Peripheral vascular disease, unspecified: Secondary | ICD-10-CM | POA: Diagnosis not present

## 2017-09-27 DIAGNOSIS — Z7982 Long term (current) use of aspirin: Secondary | ICD-10-CM | POA: Diagnosis not present

## 2017-09-27 DIAGNOSIS — R918 Other nonspecific abnormal finding of lung field: Secondary | ICD-10-CM | POA: Diagnosis present

## 2017-09-27 DIAGNOSIS — Z79899 Other long term (current) drug therapy: Secondary | ICD-10-CM | POA: Diagnosis not present

## 2017-09-27 DIAGNOSIS — I10 Essential (primary) hypertension: Secondary | ICD-10-CM | POA: Diagnosis not present

## 2017-09-27 DIAGNOSIS — J439 Emphysema, unspecified: Secondary | ICD-10-CM | POA: Diagnosis not present

## 2017-09-27 DIAGNOSIS — N189 Chronic kidney disease, unspecified: Secondary | ICD-10-CM | POA: Diagnosis present

## 2017-09-27 DIAGNOSIS — R0789 Other chest pain: Secondary | ICD-10-CM | POA: Diagnosis not present

## 2017-09-27 DIAGNOSIS — I249 Acute ischemic heart disease, unspecified: Secondary | ICD-10-CM | POA: Diagnosis not present

## 2017-09-27 DIAGNOSIS — J449 Chronic obstructive pulmonary disease, unspecified: Secondary | ICD-10-CM | POA: Diagnosis present

## 2017-09-27 DIAGNOSIS — I509 Heart failure, unspecified: Secondary | ICD-10-CM | POA: Diagnosis not present

## 2017-09-27 DIAGNOSIS — E119 Type 2 diabetes mellitus without complications: Secondary | ICD-10-CM | POA: Diagnosis not present

## 2017-09-28 ENCOUNTER — Encounter: Payer: Self-pay | Admitting: Cardiology

## 2017-09-28 DIAGNOSIS — R079 Chest pain, unspecified: Secondary | ICD-10-CM | POA: Diagnosis not present

## 2017-09-28 DIAGNOSIS — I714 Abdominal aortic aneurysm, without rupture: Secondary | ICD-10-CM

## 2017-10-03 ENCOUNTER — Encounter: Payer: Self-pay | Admitting: Cardiology

## 2017-10-03 ENCOUNTER — Ambulatory Visit (INDEPENDENT_AMBULATORY_CARE_PROVIDER_SITE_OTHER): Payer: Medicare Other | Admitting: Cardiology

## 2017-10-03 VITALS — BP 128/78 | HR 86 | Ht 70.0 in | Wt 242.4 lb

## 2017-10-03 DIAGNOSIS — I119 Hypertensive heart disease without heart failure: Secondary | ICD-10-CM | POA: Diagnosis not present

## 2017-10-03 DIAGNOSIS — R918 Other nonspecific abnormal finding of lung field: Secondary | ICD-10-CM

## 2017-10-03 DIAGNOSIS — I493 Ventricular premature depolarization: Secondary | ICD-10-CM | POA: Diagnosis not present

## 2017-10-03 DIAGNOSIS — E785 Hyperlipidemia, unspecified: Secondary | ICD-10-CM | POA: Diagnosis not present

## 2017-10-03 DIAGNOSIS — I5042 Chronic combined systolic (congestive) and diastolic (congestive) heart failure: Secondary | ICD-10-CM

## 2017-10-03 DIAGNOSIS — C3490 Malignant neoplasm of unspecified part of unspecified bronchus or lung: Secondary | ICD-10-CM

## 2017-10-03 DIAGNOSIS — I519 Heart disease, unspecified: Secondary | ICD-10-CM

## 2017-10-03 DIAGNOSIS — J449 Chronic obstructive pulmonary disease, unspecified: Secondary | ICD-10-CM | POA: Diagnosis not present

## 2017-10-03 HISTORY — DX: Malignant neoplasm of unspecified part of unspecified bronchus or lung: C34.90

## 2017-10-03 HISTORY — DX: Chronic combined systolic (congestive) and diastolic (congestive) heart failure: I50.42

## 2017-10-03 HISTORY — DX: Heart disease, unspecified: I51.9

## 2017-10-03 MED ORDER — SACUBITRIL-VALSARTAN 24-26 MG PO TABS: 1.0000 | ORAL_TABLET | Freq: Two times a day (BID) | ORAL | 3 refills | Status: DC

## 2017-10-03 NOTE — Patient Instructions (Addendum)
Medication Instructions:  Your physician has recommended you make the following change in your medication:   STOP losartan for 36 hours  START sacubitril-valsartan (entresto) 24-26 mg: Take 1 tablet twice daily starting 36 hours after losartan has been stopped.  Labwork: None  Testing/Procedures: Your physician has recommended that you wear a ZIO patch monitor. ZIO patch monitors are medical devices that record the heart's electrical activity. Doctors most often use these monitors to diagnose arrhythmias. Arrhythmias are problems with the speed or rhythm of the heartbeat. The monitor is a small, portable device. You can wear one while you do your normal daily activities. This is usually used to diagnose what is causing palpitations/syncope (passing out). Wear for 14 days.    Follow-Up: Your physician recommends that you schedule a follow-up appointment in: 6 weeks.  If you need a refill on your cardiac medications before your next appointment, please call your pharmacy.   Thank you for choosing [facility]! Robyne Peers, RN [phone]      Heart Failure  Weigh yourself every morning when you first wake up and record on a calender or note pad, bring this to your office visits. Using a pill tender can help with taking your medications consistently.  Limit your fluid intake to 2 liters daily  Limit your sodium intake to less than 2-3 grams daily. Ask if you need dietary teaching.  If you gain more than 3 pounds (from your dry weight ), double your dose of diuretic for the day.  If you gain more than 5 pounds (from your dry weight), double your dose of lasix and call your heart failure doctor.  Please do not smoke tobacco since it is very bad for your heart.  Please do not drink alcohol since it can worsen your heart failure.Also avoid OTC nonsteroidal drugs, such as advil, aleve and motrin.  Try to exercise for at least 30 minutes every day because this will help  your heart be more efficient. You may be eligible for supervised cardiac rehab, ask your physician.

## 2017-10-03 NOTE — Progress Notes (Signed)
Cardiology Office Note:    Date:  10/03/2017   ID:  John Maddox, DOB 1940-05-27, MRN [id number]  PCP:  Renaldo Reel, PA  Cardiologist:  Shirlee More, MD   Referring MD: Renaldo Reel, [place]  ASSESSMENT:    1. Chronic combined systolic and diastolic heart failure ([place])   2. Left ventricular dysfunction   3. Hypertensive heart disease without heart failure   4. PVC's (premature ventricular contractions)   5. Chronic obstructive pulmonary disease, unspecified COPD type ([place])   6. Lung mass   7. Hyperlipidemia, unspecified hyperlipidemia type    PLAN:    In order of problems listed above:  1. He clearly has evidence of cardiomyopathy with his ejection fraction mildly to moderately reduced but not dilators and at this time decompensated heart failure without volume overload with a low proBNP level I think most of his shortness of breath is due to lung disease including COPD and a lung mass highly suggestive of malignancy.  To prevent progression I will transition him from ARB to [facility] and unfortunately with his bronchospasm and COPD at this time I will not start a beta-blocker.  I will see him back in my office and renal function stable blood pressure levels uptitrate his ARNI continue his low-dose loop diuretic and consider Spironolactone.  I cautioned him to stop adding salt to his diet he agrees 2. 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 Blood pressure stable switch from ARB to ARNI 4. Asymptomatic I asked him to wear a 14-day extended monitor to assess if he has significant arrhythmia and cautioned him that PVCs can give her erratic heart rates on pulse oximeter's 5. Stable managed by his PCP 6. Has highly suggestive of lung cancer await evaluation with PET and likely biopsy 7. Continue his statin  Next appointment 4 to 6 weeks   Medication Adjustments/Labs and Tests Ordered: Current medicines are reviewed at length with the patient today.   Concerns regarding medicines are outlined above.  Orders Placed This Encounter  Procedures  . LONG TERM MONITOR (3-14 DAYS)   Meds ordered this encounter  Medications  . sacubitril-valsartan (ENTRESTO) 24-26 MG    Sig: Take 1 tablet by mouth 2 (two) times daily.    Dispense:  60 tablet    Refill:  3    Please Honor Card patient is presenting for Carmie Kanner: [id number]; Juanna Cao: [phone]; ZSWFU: [id number]; ISSUER: [id number] ID: [id number]     Chief Complaint  Patient presents with  . Cardiomyopathy  . Hypertension  I was recently at [facility] with a slow heart rate  History of Present Illness:    Ivy Meriwether is a 77 y.o. male with a history of hypertension hyperlipidemia and peripheral arterial disease with endovascular repair abdominal aortic aneurysm 2006 and left external iliac artery stent in 2000.  His last seen by vascular surgery September 2018 and had mild bilateral carotid artery stenosis who is being seen today for the evaluation of PVCs at the request of Renaldo Reel, [place].  He was  admitted to [facility] 09/27/2017 with chest pain no evidence of acute coronary syndrome although his EKGs did show frequent PVCs echocardiogram suggested mild LV dysfunction EF 45 to 50% myocardial perfusion study showed no ischemia EF calculated 37% cardiac enzymes were normal proBNP level was normal and chest x-ray and CT scan showed the presence of a right lung mass suggestive of malignancy.  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  His renal function on admission to the hospital was normal creatinine 1.1 calculated GFR greater than 60 cc CBC was normal.  EKG showed sinus rhythm with APCs PVCs but no acute ischemic changes.  Interestingly during his myocardial perfusion study no arrhythmia was seen.  Is a [place] [employer] veteran who is disabled.  He relates he has a history of diastolic dysfunction was taking a diuretic and thinks he  has chronic heart failure.  Recently he is using his pulse oximeter and he noticed erratic heart rates down in the 30s and prompted him to go the emergency room he has had some very vague fleeting nonanginal nonexertional chest discomfort the capture people's attention he had no evidence of acute coronary syndrome and did not appear to have decompensated heart failure by exam proBNP level or echocardiogram.  He was found to have a lung mass suggestive of malignancy as a PET scan next week and likely need lung biopsy.  He has wheezing and cough and uses a bronchodilator.  He has short of breath with activities walking in the home and even at times with ADLs needing to rest before finishing.  He is not having edema although he had salt to his diet has no orthopnea chest pain palpitation or syncope.  He has no background history of coronary or rheumatic heart disease.  Past Medical History:  Diagnosis Date  . Arrhythmia   . Asthma 09/23/2017  . Congestive heart failure ([place]) 09/23/2017  . COPD (chronic obstructive pulmonary disease) ([place]) 09/23/2017  . Diabetes mellitus ([place]) 09/23/2017  . Diastolic dysfunction 08/17/9935  . Esophageal reflux 09/23/2017  . Fatigue 09/23/2017  . Gout 09/23/2017  . History of prostate cancer 09/23/2017  . Hyperlipidemia 09/23/2017  . Hypertension 09/23/2017  . PVC's (premature ventricular contractions) 09/23/2017    Past Surgical History:  Procedure Laterality Date  . ABDOMINAL AORTIC ANEURYSM REPAIR    . AORTA - ILIAC ARTERY BYPASS GRAFT    . BACK SURGERY    . CATARACT EXTRACTION, BILATERAL    . CHOLECYSTECTOMY    . HEMICOLECTOMY    . HERNIA REPAIR    . PROSTATE SURGERY    . REPLACEMENT TOTAL KNEE    . STENT PLACEMENT VASCULAR ([place] HX)    . TONSILLECTOMY      Current Medications: Current Meds  Medication Sig  . allopurinol (ZYLOPRIM) 300 MG tablet Take 300 mg by mouth daily.  [name] aspirin EC 81 MG tablet Take 81 mg by mouth daily.  . Calcium Carbonate (CALCIUM-CARB 600 PO)  Take 1 tablet by mouth daily.  . celecoxib (CELEBREX) 200 MG capsule Take 200 mg by mouth 2 (two) times daily.  . fluticasone-salmeterol (ADVAIR HFA) 115-21 MCG/ACT inhaler Inhale 1 puff into the lungs as directed.  . Multiple Vitamin (MULTI-VITAMINS) TABS Take 1 tablet by mouth daily.  [name] oxyCODONE (OXY IR/ROXICODONE) 5 MG immediate release tablet TAKE 1 TABLET BY MOUTH TWICE DAILY AS NEEDED FOR 21 DAYS  . pantoprazole (PROTONIX) 20 MG tablet Take 20 mg by mouth daily.  [name] PROAIR HFA 108 (90 Base) MCG/ACT inhaler Inhale 1 puff into the lungs as directed.  Orlie Dakin Sodium (SENOKOT S PO) Take 2 tablets by mouth daily.  . simvastatin (ZOCOR) 40 MG tablet Take 40 mg by mouth daily.  [name] tiotropium (SPIRIVA HANDIHALER) 18 MCG inhalation capsule INHALE THE CONTENTS OF 1 CAPSULE DAILY  . torsemide (DEMADEX) 10 MG tablet Take 10 mg by mouth daily.  . [  DISCONTINUED] losartan (COZAAR) 50 MG tablet Take 50 mg by mouth daily.     Allergies:   Patient has no known allergies.   Social History   Socioeconomic History  . Marital status: Married    Spouse name: Not on file  . Number of children: Not on file  . Years of education: Not on file  . Highest education level: Not on file  Occupational History  . Not on file  Social Needs  . Financial resource strain: Not on file  . Food insecurity:    Worry: Not on file    Inability: Not on file  . Transportation needs:    Medical: Not on file    Non-medical: Not on file  Tobacco Use  . Smoking status: Former Smoker    Packs/day: 2.50    Years: 40.00    Pack years: 100.00    Types: Cigarettes    Last attempt to quit: 2014    Years since quitting: 5.7  . Smokeless tobacco: Never Used  Substance and Sexual Activity  . Alcohol use: Not Currently  . Drug use: Not Currently  . Sexual activity: Not on file  Lifestyle  . Physical activity:    Days per week: Not on file    Minutes per session: Not on file  . Stress: Not on file    Relationships  . Social connections:    Talks on phone: Not on file    Gets together: Not on file    Attends religious service: Not on file    Active member of club or organization: Not on file    Attends meetings of clubs or organizations: Not on file    Relationship status: Not on file  Other Topics Concern  . Not on file  Social History Narrative  . Not on file     Family History: The patient's family history includes Alcohol abuse in his father; Arthritis in his brother, father, and mother; Heart attack in his father; Heart disease in his father; Hyperlipidemia in his father; Hypertension in his father and mother; Lung cancer in his brother and mother; Prostate cancer in his father.  ROS:   Review of Systems  Constitution: Positive for malaise/fatigue.  HENT: Negative.   Eyes: Negative.   Cardiovascular: Positive for chest pain, dyspnea on exertion, irregular heartbeat and leg swelling.  Respiratory: Positive for shortness of breath and wheezing.   Endocrine: Negative.   Hematologic/Lymphatic: Bruises/bleeds easily.  Skin: Negative.   Musculoskeletal: Positive for back pain, joint pain and joint swelling.  Gastrointestinal: Negative.   Genitourinary: Negative.   Neurological: Positive for dizziness.  Psychiatric/Behavioral: Negative.   Allergic/Immunologic: Negative.    Please see the history of present illness.     All other systems reviewed and are negative.  EKGs/Labs/Other Studies Reviewed:    The following studies were reviewed today: [facility] records labs x-rays EKG echocardiogram and myocardial perfusion study reviewed prior to the visit   Physical Exam:    VS:  BP 128/78 (BP Location: Left Arm, Patient Position: Sitting, Cuff Size: Large)   Pulse 86   Ht 5\' 10"  (1.778 m)   Wt 242 lb 6.4 oz (110 kg)   SpO2 96%   BMI 34.78 kg/m     Wt Readings from Last 3 Encounters:  10/03/17 242 lb 6.4 oz (110 kg)     GEN:  Chronically ill appearing in no  acute distress he has good cutaneous telangiectasias of the chest wall relates a history of  alcohol heavy usage as a young man in the 1960s HEENT: Normal NECK: No JVD; No carotid bruits LYMPHATICS: No lymphadenopathy CARDIAC: RRR, no murmurs, rubs, gallops RESPIRATORY:  Clear to auscultation without rales, wheezing or rhonchi  ABDOMEN: Soft, non-tender, non-distended MUSCULOSKELETAL:  No edema; No deformity  SKIN: Warm and dry NEUROLOGIC:  Alert and oriented x 3 PSYCHIATRIC:  Normal affect     Signed, Shirlee More, MD  10/03/2017 3:05 PM    [facility]

## 2017-10-04 DIAGNOSIS — I714 Abdominal aortic aneurysm, without rupture: Secondary | ICD-10-CM | POA: Diagnosis not present

## 2017-10-04 DIAGNOSIS — I509 Heart failure, unspecified: Secondary | ICD-10-CM | POA: Diagnosis not present

## 2017-10-04 DIAGNOSIS — R918 Other nonspecific abnormal finding of lung field: Secondary | ICD-10-CM | POA: Insufficient documentation

## 2017-10-04 DIAGNOSIS — Z9189 Other specified personal risk factors, not elsewhere classified: Secondary | ICD-10-CM | POA: Diagnosis not present

## 2017-10-04 DIAGNOSIS — E669 Obesity, unspecified: Secondary | ICD-10-CM | POA: Diagnosis not present

## 2017-10-04 DIAGNOSIS — J449 Chronic obstructive pulmonary disease, unspecified: Secondary | ICD-10-CM | POA: Diagnosis not present

## 2017-10-04 HISTORY — DX: Other nonspecific abnormal finding of lung field: R91.8

## 2017-10-08 DIAGNOSIS — N200 Calculus of kidney: Secondary | ICD-10-CM | POA: Diagnosis not present

## 2017-10-08 DIAGNOSIS — R918 Other nonspecific abnormal finding of lung field: Secondary | ICD-10-CM | POA: Diagnosis not present

## 2017-10-08 DIAGNOSIS — I700 Atherosclerosis of aorta: Secondary | ICD-10-CM | POA: Diagnosis not present

## 2017-10-08 DIAGNOSIS — J439 Emphysema, unspecified: Secondary | ICD-10-CM | POA: Diagnosis not present

## 2017-10-08 DIAGNOSIS — I2510 Atherosclerotic heart disease of native coronary artery without angina pectoris: Secondary | ICD-10-CM | POA: Diagnosis not present

## 2017-10-08 DIAGNOSIS — R911 Solitary pulmonary nodule: Secondary | ICD-10-CM | POA: Diagnosis not present

## 2017-10-08 DIAGNOSIS — M160 Bilateral primary osteoarthritis of hip: Secondary | ICD-10-CM | POA: Diagnosis not present

## 2017-10-08 DIAGNOSIS — K5790 Diverticulosis of intestine, part unspecified, without perforation or abscess without bleeding: Secondary | ICD-10-CM | POA: Diagnosis not present

## 2017-10-08 DIAGNOSIS — J320 Chronic maxillary sinusitis: Secondary | ICD-10-CM | POA: Diagnosis not present

## 2017-10-08 DIAGNOSIS — I712 Thoracic aortic aneurysm, without rupture: Secondary | ICD-10-CM | POA: Diagnosis not present

## 2017-10-08 DIAGNOSIS — I714 Abdominal aortic aneurysm, without rupture: Secondary | ICD-10-CM | POA: Diagnosis not present

## 2017-10-09 ENCOUNTER — Ambulatory Visit: Payer: Medicare Other | Admitting: Cardiology

## 2017-10-11 ENCOUNTER — Ambulatory Visit (INDEPENDENT_AMBULATORY_CARE_PROVIDER_SITE_OTHER): Payer: Medicare Other

## 2017-10-11 DIAGNOSIS — I493 Ventricular premature depolarization: Secondary | ICD-10-CM | POA: Diagnosis not present

## 2017-10-15 DIAGNOSIS — M961 Postlaminectomy syndrome, not elsewhere classified: Secondary | ICD-10-CM | POA: Diagnosis not present

## 2017-10-15 DIAGNOSIS — G8929 Other chronic pain: Secondary | ICD-10-CM | POA: Diagnosis not present

## 2017-10-15 DIAGNOSIS — Z79891 Long term (current) use of opiate analgesic: Secondary | ICD-10-CM | POA: Diagnosis not present

## 2017-10-18 DIAGNOSIS — R918 Other nonspecific abnormal finding of lung field: Secondary | ICD-10-CM | POA: Diagnosis not present

## 2017-10-18 DIAGNOSIS — R001 Bradycardia, unspecified: Secondary | ICD-10-CM | POA: Diagnosis not present

## 2017-10-18 DIAGNOSIS — E1122 Type 2 diabetes mellitus with diabetic chronic kidney disease: Secondary | ICD-10-CM | POA: Diagnosis not present

## 2017-10-18 DIAGNOSIS — Z87891 Personal history of nicotine dependence: Secondary | ICD-10-CM | POA: Diagnosis not present

## 2017-10-18 DIAGNOSIS — C3411 Malignant neoplasm of upper lobe, right bronchus or lung: Secondary | ICD-10-CM | POA: Diagnosis not present

## 2017-10-18 DIAGNOSIS — G4733 Obstructive sleep apnea (adult) (pediatric): Secondary | ICD-10-CM | POA: Diagnosis not present

## 2017-10-18 DIAGNOSIS — Z923 Personal history of irradiation: Secondary | ICD-10-CM | POA: Diagnosis not present

## 2017-10-18 DIAGNOSIS — N189 Chronic kidney disease, unspecified: Secondary | ICD-10-CM | POA: Diagnosis not present

## 2017-10-18 DIAGNOSIS — I130 Hypertensive heart and chronic kidney disease with heart failure and stage 1 through stage 4 chronic kidney disease, or unspecified chronic kidney disease: Secondary | ICD-10-CM | POA: Diagnosis not present

## 2017-10-18 DIAGNOSIS — Z8546 Personal history of malignant neoplasm of prostate: Secondary | ICD-10-CM | POA: Diagnosis not present

## 2017-10-18 DIAGNOSIS — Z79899 Other long term (current) drug therapy: Secondary | ICD-10-CM | POA: Diagnosis not present

## 2017-10-18 DIAGNOSIS — I509 Heart failure, unspecified: Secondary | ICD-10-CM | POA: Diagnosis not present

## 2017-10-18 DIAGNOSIS — R5383 Other fatigue: Secondary | ICD-10-CM | POA: Diagnosis not present

## 2017-10-18 DIAGNOSIS — J449 Chronic obstructive pulmonary disease, unspecified: Secondary | ICD-10-CM | POA: Diagnosis not present

## 2017-10-25 DIAGNOSIS — I119 Hypertensive heart disease without heart failure: Secondary | ICD-10-CM | POA: Diagnosis not present

## 2017-10-25 DIAGNOSIS — G4733 Obstructive sleep apnea (adult) (pediatric): Secondary | ICD-10-CM | POA: Diagnosis not present

## 2017-10-25 DIAGNOSIS — R846 Abnormal cytological findings in specimens from respiratory organs and thorax: Secondary | ICD-10-CM | POA: Diagnosis not present

## 2017-10-25 DIAGNOSIS — Z955 Presence of coronary angioplasty implant and graft: Secondary | ICD-10-CM | POA: Diagnosis not present

## 2017-10-25 DIAGNOSIS — E119 Type 2 diabetes mellitus without complications: Secondary | ICD-10-CM | POA: Diagnosis not present

## 2017-10-25 DIAGNOSIS — R918 Other nonspecific abnormal finding of lung field: Secondary | ICD-10-CM | POA: Diagnosis not present

## 2017-10-25 DIAGNOSIS — J8410 Pulmonary fibrosis, unspecified: Secondary | ICD-10-CM | POA: Diagnosis not present

## 2017-10-25 DIAGNOSIS — Z87891 Personal history of nicotine dependence: Secondary | ICD-10-CM | POA: Diagnosis not present

## 2017-10-25 DIAGNOSIS — I493 Ventricular premature depolarization: Secondary | ICD-10-CM | POA: Diagnosis not present

## 2017-10-25 DIAGNOSIS — J42 Unspecified chronic bronchitis: Secondary | ICD-10-CM | POA: Diagnosis not present

## 2017-10-25 DIAGNOSIS — R5383 Other fatigue: Secondary | ICD-10-CM | POA: Diagnosis not present

## 2017-10-25 DIAGNOSIS — C3411 Malignant neoplasm of upper lobe, right bronchus or lung: Secondary | ICD-10-CM | POA: Diagnosis not present

## 2017-10-25 DIAGNOSIS — J984 Other disorders of lung: Secondary | ICD-10-CM | POA: Diagnosis not present

## 2017-10-25 DIAGNOSIS — R9431 Abnormal electrocardiogram [ECG] [EKG]: Secondary | ICD-10-CM | POA: Diagnosis not present

## 2017-10-25 DIAGNOSIS — Z8546 Personal history of malignant neoplasm of prostate: Secondary | ICD-10-CM | POA: Diagnosis not present

## 2017-10-25 DIAGNOSIS — J449 Chronic obstructive pulmonary disease, unspecified: Secondary | ICD-10-CM | POA: Diagnosis not present

## 2017-10-28 DIAGNOSIS — R918 Other nonspecific abnormal finding of lung field: Secondary | ICD-10-CM | POA: Diagnosis not present

## 2017-10-28 DIAGNOSIS — R5383 Other fatigue: Secondary | ICD-10-CM | POA: Diagnosis not present

## 2017-10-28 DIAGNOSIS — J449 Chronic obstructive pulmonary disease, unspecified: Secondary | ICD-10-CM | POA: Diagnosis not present

## 2017-10-28 DIAGNOSIS — G4733 Obstructive sleep apnea (adult) (pediatric): Secondary | ICD-10-CM | POA: Diagnosis not present

## 2017-10-31 DIAGNOSIS — R978 Other abnormal tumor markers: Secondary | ICD-10-CM | POA: Diagnosis not present

## 2017-10-31 DIAGNOSIS — C3411 Malignant neoplasm of upper lobe, right bronchus or lung: Secondary | ICD-10-CM | POA: Diagnosis not present

## 2017-10-31 DIAGNOSIS — R002 Palpitations: Secondary | ICD-10-CM | POA: Diagnosis not present

## 2017-10-31 DIAGNOSIS — Z7901 Long term (current) use of anticoagulants: Secondary | ICD-10-CM | POA: Diagnosis not present

## 2017-11-01 ENCOUNTER — Telehealth: Payer: Self-pay

## 2017-11-01 MED ORDER — SACUBITRIL-VALSARTAN 24-26 MG PO TABS: 1.0000 | ORAL_TABLET | Freq: Two times a day (BID) | ORAL | 1 refills | Status: DC

## 2017-11-01 NOTE — Telephone Encounter (Signed)
Rx sent to pharmacy as requested.

## 2017-11-06 DIAGNOSIS — C3411 Malignant neoplasm of upper lobe, right bronchus or lung: Secondary | ICD-10-CM | POA: Diagnosis not present

## 2017-11-06 DIAGNOSIS — G4733 Obstructive sleep apnea (adult) (pediatric): Secondary | ICD-10-CM | POA: Diagnosis not present

## 2017-11-06 DIAGNOSIS — J449 Chronic obstructive pulmonary disease, unspecified: Secondary | ICD-10-CM | POA: Diagnosis not present

## 2017-11-06 DIAGNOSIS — C3490 Malignant neoplasm of unspecified part of unspecified bronchus or lung: Secondary | ICD-10-CM | POA: Diagnosis not present

## 2017-11-06 DIAGNOSIS — R5383 Other fatigue: Secondary | ICD-10-CM | POA: Diagnosis not present

## 2017-11-06 DIAGNOSIS — R918 Other nonspecific abnormal finding of lung field: Secondary | ICD-10-CM | POA: Diagnosis not present

## 2017-11-07 DIAGNOSIS — Z23 Encounter for immunization: Secondary | ICD-10-CM | POA: Diagnosis not present

## 2017-11-07 DIAGNOSIS — C3411 Malignant neoplasm of upper lobe, right bronchus or lung: Secondary | ICD-10-CM | POA: Diagnosis not present

## 2017-11-08 DIAGNOSIS — C3411 Malignant neoplasm of upper lobe, right bronchus or lung: Secondary | ICD-10-CM | POA: Diagnosis not present

## 2017-11-13 DIAGNOSIS — E119 Type 2 diabetes mellitus without complications: Secondary | ICD-10-CM | POA: Diagnosis not present

## 2017-11-14 ENCOUNTER — Encounter: Payer: Self-pay | Admitting: Oncology

## 2017-11-14 DIAGNOSIS — C3411 Malignant neoplasm of upper lobe, right bronchus or lung: Secondary | ICD-10-CM | POA: Diagnosis not present

## 2017-11-14 DIAGNOSIS — Z5111 Encounter for antineoplastic chemotherapy: Secondary | ICD-10-CM | POA: Diagnosis not present

## 2017-11-15 DIAGNOSIS — Z510 Encounter for antineoplastic radiation therapy: Secondary | ICD-10-CM | POA: Diagnosis not present

## 2017-11-15 DIAGNOSIS — C3411 Malignant neoplasm of upper lobe, right bronchus or lung: Secondary | ICD-10-CM | POA: Diagnosis not present

## 2017-11-18 DIAGNOSIS — I429 Cardiomyopathy, unspecified: Secondary | ICD-10-CM | POA: Insufficient documentation

## 2017-11-18 DIAGNOSIS — Z510 Encounter for antineoplastic radiation therapy: Secondary | ICD-10-CM | POA: Diagnosis not present

## 2017-11-18 DIAGNOSIS — C3411 Malignant neoplasm of upper lobe, right bronchus or lung: Secondary | ICD-10-CM | POA: Diagnosis not present

## 2017-11-18 HISTORY — DX: Cardiomyopathy, unspecified: I42.9

## 2017-11-18 NOTE — Progress Notes (Signed)
Cardiology Office Note:    Date:  11/19/2017   ID:  John Maddox, DOB 06/15/1940, MRN [id number]  PCP:  Renaldo Reel, PA  Cardiologist:  Shirlee More, MD    Referring MD: Renaldo Reel, [place]    ASSESSMENT:    1. Chronic combined systolic and diastolic heart failure ([place])   2. Other cardiomyopathy ([place])   3. Hypertensive heart disease with chronic combined systolic and diastolic congestive heart failure ([place])   4. PVC's (premature ventricular contractions)    PLAN:    In order of problems listed above:  1. He is objectively improved since he is transition from an ARB to [facility] and I will uptitrate.  Unfortunately I do not think his insurance is going to pay as a board spelt recognize specialist and cardiovascular disease the criteria is EF less than 40 he is objectively improved not going to finish his medical therapy he will uptitrate his Delene Loll I will asked the oncologist to check a proBNP level with his next labs and send it to me and see him back in the office in 6 months.  His heart failure is compensated he has no edema continue his loop diuretic not a candidate for beta-blocker with his lung disease.  Labs from [facility] requested if renal function potassium are normal will consider MRA at his next visit and check echocardiogram in the near future. 2. See above dilated cardiomyopathy setting of lung cancer and chemotherapy 3. Stable blood pressure uptitrate Entresto 4. Improved asymptomatic   Next appointment: 6 weeks   Medication Adjustments/Labs and Tests Ordered: Current medicines are reviewed at length with the patient today.  Concerns regarding medicines are outlined above.  No orders of the defined types were placed in this encounter.  No orders of the defined types were placed in this encounter.   No chief complaint on file.   History of Present Illness:    John Maddox is a 77 y.o. male with a hx of PVC's, hypertension hyperlipidemia and  peripheral arterial disease with endovascular repair abdominal aortic aneurysm 2006 and left external iliac artery stent in 2000. His last seen by vascular surgery September 2018 and had mild bilateral carotid artery stenosis  last seen 10/03/17.  He was  admitted to [facility] 09/27/2017 with chest pain no evidence of acute coronary syndrome although his EKGs did show frequent PVCs echocardiogram suggested mild LV dysfunction EF 45 to 50% myocardial perfusion study showed no ischemia EF calculated 37% cardiac enzymes were normal proBNP level was normal and chest x-ray and CT scan showed the presence of a right lung mass suggestive of malignancy.  He also had aneurysm descending thoracic aorta 4.5 cm extending into the suprarenal abdominal aorta.  His renal function on admission to the hospital was normal creatinine 1.1 calculated GFR greater than 60 cc CBC was normal.  EKG showed sinus rhythm with APCs PVCs but no acute ischemic changes.  Interestingly during his myocardial perfusion study no arrhythmia was seen.   He is  a [place] [employer] veteran who is disabled. He relates he has a history of diastolic dysfunction was taking a diuretic and thinks he has chronic heart failure. Recently he is using his pulse oximeter and he noticed erratic heart rates down in the 30s and prompted him to go the emergency room he has had some very vague fleeting nonanginal nonexertional chest discomfort the capture people's attention he had no evidence of acute coronary syndrome and did not appear to have decompensated  heart failure by exam proBNP level or echocardiogram. He was found to have a lung mass suggestive of malignancy as a PET scan next week and likely need lung biopsy. He has wheezing and cough and uses a bronchodilator. He has short of breath with activities walking in the home and even at times with ADLs needing to rest before finishing. He is not having edema although he had salt to his diet has no orthopnea  chest pain palpitation or syncope. He has no background history of coronary or rheumatic heart disease.   I reviewed the 2 paragraphs above concur with the findings.  Placed in the chart for completeness of information.  14 day event monitor: Conclusion, frequent supraventricular ectopy with brief runs of APCs longest 12 seconds rate 140 bpm atrial tachycardia. Rare PVCs were present in the one symptom triggered episode was frequent PVCs. No significant bradycardia is noted.  Compliance with diet, lifestyle and medications: Yes  Is clearly improved no edema less short of breath Heart Association class II when he is walking outside of his home no edema orthopnea chest pain palpitation or syncope.  He is not a candidate for beta-blocker with his lung disease EF of less than 40% Entresto was appropriate Past Medical History:  Diagnosis Date  . Arrhythmia   . Asthma 09/23/2017  . Congestive heart failure ([place]) 09/23/2017  . COPD (chronic obstructive pulmonary disease) ([place]) 09/23/2017  . Diabetes mellitus ([place]) 09/23/2017  . Diastolic dysfunction 08/15/8561  . Esophageal reflux 09/23/2017  . Fatigue 09/23/2017  . Gout 09/23/2017  . History of prostate cancer 09/23/2017  . Hyperlipidemia 09/23/2017  . Hypertension 09/23/2017  . PVC's (premature ventricular contractions) 09/23/2017    Past Surgical History:  Procedure Laterality Date  . ABDOMINAL AORTIC ANEURYSM REPAIR    . AORTA - ILIAC ARTERY BYPASS GRAFT    . BACK SURGERY    . CATARACT EXTRACTION, BILATERAL    . CHOLECYSTECTOMY    . HEMICOLECTOMY    . HERNIA REPAIR    . PROSTATE SURGERY    . REPLACEMENT TOTAL KNEE    . STENT PLACEMENT VASCULAR ([place] HX)    . TONSILLECTOMY      Current Medications: Current Meds  Medication Sig  . allopurinol (ZYLOPRIM) 300 MG tablet Take 300 mg by mouth daily.  [name] amitriptyline (ELAVIL) 50 MG tablet Take 50 mg by mouth daily.  [name] aspirin EC 81 MG tablet Take 81 mg by mouth daily.  . baclofen (LIORESAL) 10 MG tablet  TAKE 1 TABLET BY MOUTH THREE TIMES DAILY AS NEEDED FOR HICCUPS  . Calcium Carbonate (CALCIUM-CARB 600 PO) Take 1 tablet by mouth 2 (two) times daily.   . celecoxib (CELEBREX) 200 MG capsule Take 200 mg by mouth 2 (two) times daily.  [name] dexamethasone (DECADRON) 4 MG tablet TAKE 3 TABLETS BY MOUTH THE NIGHT BEFORE AND THE MORNING OF CHEMOTHERAPY WITH EACH CYCLE  . fluticasone-salmeterol (ADVAIR HFA) 115-21 MCG/ACT inhaler Inhale 2 puffs into the lungs 2 (two) times daily.   . metFORMIN (GLUCOPHAGE-XR) 500 MG 24 hr tablet Take 500 mg by mouth daily.  . Multiple Vitamin (MULTI-VITAMINS) TABS Take 1 tablet by mouth daily.  . ondansetron (ZOFRAN) 4 MG tablet TAKE 1 TABLET BY MOUTH EVERY 4 HOURS AS NEEDED FOR NAUSEA  . oxyCODONE (OXY IR/ROXICODONE) 5 MG immediate release tablet TAKE 1 TABLET BY MOUTH TWICE DAILY AS NEEDED FOR 21 DAYS  . pantoprazole (PROTONIX) 20 MG tablet Take 20 mg by mouth daily.  [name] PROAIR  HFA 108 (90 Base) MCG/ACT inhaler Inhale 1 puff into the lungs every 6 (six) hours as needed.   . prochlorperazine (COMPAZINE) 10 MG tablet TAKE 1 TABLET (10 MG) BY MOUTH EVERY 6 HOURS AS NEEDED FOR NAUSEA  . sacubitril-valsartan (ENTRESTO) 24-26 MG Take 1 tablet by mouth 2 (two) times daily.  Orlie Dakin Sodium (SENOKOT S PO) Take 2 tablets by mouth daily.  . simvastatin (ZOCOR) 40 MG tablet Take 40 mg by mouth daily.  [name] tiotropium (SPIRIVA HANDIHALER) 18 MCG inhalation capsule INHALE THE CONTENTS OF 1 CAPSULE DAILY  . torsemide (DEMADEX) 10 MG tablet Take 10 mg by mouth daily.     Allergies:   Patient has no known allergies.   Social History   Socioeconomic History  . Marital status: Married    Spouse name: Not on file  . Number of children: Not on file  . Years of education: Not on file  . Highest education level: Not on file  Occupational History  . Not on file  Social Needs  . Financial resource strain: Not on file  . Food insecurity:    Worry: Not on file    Inability:  Not on file  . Transportation needs:    Medical: Not on file    Non-medical: Not on file  Tobacco Use  . Smoking status: Former Smoker    Packs/day: 2.50    Years: 40.00    Pack years: 100.00    Types: Cigarettes    Last attempt to quit: 2014    Years since quitting: 5.8  . Smokeless tobacco: Never Used  Substance and Sexual Activity  . Alcohol use: Not Currently  . Drug use: Not Currently  . Sexual activity: Not on file  Lifestyle  . Physical activity:    Days per week: Not on file    Minutes per session: Not on file  . Stress: Not on file  Relationships  . Social connections:    Talks on phone: Not on file    Gets together: Not on file    Attends religious service: Not on file    Active member of club or organization: Not on file    Attends meetings of clubs or organizations: Not on file    Relationship status: Not on file  Other Topics Concern  . Not on file  Social History Narrative  . Not on file     Family History: The patient's family history includes Alcohol abuse in his father; Arthritis in his brother, father, and mother; Heart attack in his father; Heart disease in his father; Hyperlipidemia in his father; Hypertension in his father and mother; Lung cancer in his brother and mother; Prostate cancer in his father. ROS:   Please see the history of present illness.    All other systems reviewed and are negative.  EKGs/Labs/Other Studies Reviewed:    The following studies were reviewed today  Recent Labs: No results found for requested labs within last 8760 hours.  Recent Lipid Panel No results found for: CHOL, TRIG, HDL, CHOLHDL, VLDL, LDLCALC, LDLDIRECT  Physical Exam:    VS:  BP 132/76 (BP Location: Right Arm, Patient Position: Sitting, Cuff Size: Large)   Pulse (!) 102   Ht 5\' 10"  (1.778 m)   Wt 236 lb 6.4 oz (107.2 kg)   SpO2 95%   BMI 33.92 kg/m     Wt Readings from Last 3 Encounters:  11/19/17 236 lb 6.4 oz (107.2 kg)  10/03/17 242 lb 6.4  oz (110 kg)     GEN:  Well nourished, well developed in no acute distress HEENT: Normal NECK: No JVD; No carotid bruits LYMPHATICS: No lymphadenopathy CARDIAC: RRR, no murmurs, rubs, gallops RESPIRATORY:  Clear to auscultation without rales, wheezing or rhonchi  ABDOMEN: Soft, non-tender, non-distended MUSCULOSKELETAL:  No edema; No deformity  SKIN: Warm and dry NEUROLOGIC:  Alert and oriented x 3 PSYCHIATRIC:  Normal affect    Signed, Shirlee More, MD  11/19/2017 3:08 PM    [facility]

## 2017-11-19 ENCOUNTER — Ambulatory Visit (INDEPENDENT_AMBULATORY_CARE_PROVIDER_SITE_OTHER): Payer: Medicare Other | Admitting: Cardiology

## 2017-11-19 ENCOUNTER — Encounter: Payer: Self-pay | Admitting: Cardiology

## 2017-11-19 VITALS — BP 132/76 | HR 102 | Ht 70.0 in | Wt 236.4 lb

## 2017-11-19 DIAGNOSIS — I493 Ventricular premature depolarization: Secondary | ICD-10-CM | POA: Diagnosis not present

## 2017-11-19 DIAGNOSIS — I110 Hypertensive heart disease with heart failure: Secondary | ICD-10-CM

## 2017-11-19 DIAGNOSIS — I428 Other cardiomyopathies: Secondary | ICD-10-CM

## 2017-11-19 DIAGNOSIS — I5042 Chronic combined systolic (congestive) and diastolic (congestive) heart failure: Secondary | ICD-10-CM | POA: Diagnosis not present

## 2017-11-19 DIAGNOSIS — C3411 Malignant neoplasm of upper lobe, right bronchus or lung: Secondary | ICD-10-CM | POA: Diagnosis not present

## 2017-11-19 DIAGNOSIS — Z510 Encounter for antineoplastic radiation therapy: Secondary | ICD-10-CM | POA: Diagnosis not present

## 2017-11-19 MED ORDER — SACUBITRIL-VALSARTAN 49-51 MG PO TABS: 1.0000 | ORAL_TABLET | Freq: Two times a day (BID) | ORAL | 3 refills | Status: DC

## 2017-11-19 NOTE — Patient Instructions (Signed)
Medication Instructions:  Your physician has recommended you make the following change in your medication:  INCREASE sacubitril-valsartan (entresto) 49-51 mg: Take 1 tablet twice daily   If you need a refill on your cardiac medications before your next appointment, please call your pharmacy.   Lab work: None  If you have labs (blood work) drawn today and your tests are completely normal, you will receive your results only by: John Maddox MyChart Message (if you have MyChart) OR . A paper copy in the mail If you have any lab test that is abnormal or we need to change your treatment, we will call you to review the results.  Testing/Procedures: None  Follow-Up: At [facility], you and your health needs are our priority.  As part of our continuing mission to provide you with exceptional heart care, we have created designated Provider Care Teams.  These Care Teams include your primary Cardiologist (physician) and Advanced Practice Providers (APPs -  Physician Assistants and Nurse Practitioners) who all work together to provide you with the care you need, when you need it. You will need a follow up appointment in 6 weeks.      Echocardiogram An echocardiogram, or echocardiography, uses sound waves (ultrasound) to produce an image of your heart. The echocardiogram is simple, painless, obtained within a short period of time, and offers valuable information to your health care provider. The images from an echocardiogram can provide information such as:  Evidence of coronary artery disease (CAD).  Heart size.  Heart muscle function.  Heart valve function.  Aneurysm detection.  Evidence of a past heart attack.  Fluid buildup around the heart.  Heart muscle thickening.  Assess heart valve function.  Tell a health care provider about:  Any allergies you have.  All medicines you are taking, including vitamins, herbs, eye drops, creams, and over-the-counter medicines.  Any problems you  or family members have had with anesthetic medicines.  Any blood disorders you have.  Any surgeries you have had.  Any medical conditions you have.  Whether you are pregnant or may be pregnant. What happens before the procedure? No special preparation is needed. Eat and drink normally. What happens during the procedure?  In order to produce an image of your heart, gel will be applied to your chest and a wand-like tool (transducer) will be moved over your chest. The gel will help transmit the sound waves from the transducer. The sound waves will harmlessly bounce off your heart to allow the heart images to be captured in real-time motion. These images will then be recorded.  You may need an IV to receive a medicine that improves the quality of the pictures. What happens after the procedure? You may return to your normal schedule including diet, activities, and medicines, unless your health care provider tells you otherwise. This information is not intended to replace advice given to you by your health care provider. Make sure you discuss any questions you have with your health care provider. Document Released: 12/30/1999 Document Revised: 08/20/2015 Document Reviewed: 09/08/2012 Elsevier Interactive Patient Education  2017 [employer].

## 2017-11-19 NOTE — Addendum Note (Signed)
Addended by: Austin Miles on: 11/19/2017 03:33 PM   Modules accepted: Orders

## 2017-11-20 DIAGNOSIS — C3411 Malignant neoplasm of upper lobe, right bronchus or lung: Secondary | ICD-10-CM | POA: Diagnosis not present

## 2017-11-20 DIAGNOSIS — Z510 Encounter for antineoplastic radiation therapy: Secondary | ICD-10-CM | POA: Diagnosis not present

## 2017-11-21 DIAGNOSIS — C3411 Malignant neoplasm of upper lobe, right bronchus or lung: Secondary | ICD-10-CM | POA: Diagnosis not present

## 2017-11-21 DIAGNOSIS — G629 Polyneuropathy, unspecified: Secondary | ICD-10-CM | POA: Diagnosis not present

## 2017-11-21 DIAGNOSIS — R739 Hyperglycemia, unspecified: Secondary | ICD-10-CM | POA: Diagnosis not present

## 2017-11-22 DIAGNOSIS — Z510 Encounter for antineoplastic radiation therapy: Secondary | ICD-10-CM | POA: Diagnosis not present

## 2017-11-22 DIAGNOSIS — C3411 Malignant neoplasm of upper lobe, right bronchus or lung: Secondary | ICD-10-CM | POA: Diagnosis not present

## 2017-11-24 DIAGNOSIS — G4733 Obstructive sleep apnea (adult) (pediatric): Secondary | ICD-10-CM | POA: Diagnosis not present

## 2017-11-24 DIAGNOSIS — Z711 Person with feared health complaint in whom no diagnosis is made: Secondary | ICD-10-CM | POA: Diagnosis not present

## 2017-11-25 DIAGNOSIS — C3411 Malignant neoplasm of upper lobe, right bronchus or lung: Secondary | ICD-10-CM | POA: Diagnosis not present

## 2017-11-25 DIAGNOSIS — Z510 Encounter for antineoplastic radiation therapy: Secondary | ICD-10-CM | POA: Diagnosis not present

## 2017-11-26 DIAGNOSIS — C3411 Malignant neoplasm of upper lobe, right bronchus or lung: Secondary | ICD-10-CM | POA: Diagnosis not present

## 2017-11-26 DIAGNOSIS — Z510 Encounter for antineoplastic radiation therapy: Secondary | ICD-10-CM | POA: Diagnosis not present

## 2017-11-27 DIAGNOSIS — C3411 Malignant neoplasm of upper lobe, right bronchus or lung: Secondary | ICD-10-CM | POA: Diagnosis not present

## 2017-11-27 DIAGNOSIS — Z510 Encounter for antineoplastic radiation therapy: Secondary | ICD-10-CM | POA: Diagnosis not present

## 2017-11-28 DIAGNOSIS — Z510 Encounter for antineoplastic radiation therapy: Secondary | ICD-10-CM | POA: Diagnosis not present

## 2017-11-28 DIAGNOSIS — Z5111 Encounter for antineoplastic chemotherapy: Secondary | ICD-10-CM | POA: Diagnosis not present

## 2017-11-28 DIAGNOSIS — C3411 Malignant neoplasm of upper lobe, right bronchus or lung: Secondary | ICD-10-CM | POA: Diagnosis not present

## 2017-11-29 DIAGNOSIS — C3411 Malignant neoplasm of upper lobe, right bronchus or lung: Secondary | ICD-10-CM | POA: Diagnosis not present

## 2017-11-29 DIAGNOSIS — Z510 Encounter for antineoplastic radiation therapy: Secondary | ICD-10-CM | POA: Diagnosis not present

## 2017-12-02 ENCOUNTER — Other Ambulatory Visit: Payer: Self-pay | Admitting: Cardiology

## 2017-12-02 DIAGNOSIS — R5383 Other fatigue: Secondary | ICD-10-CM | POA: Diagnosis not present

## 2017-12-02 DIAGNOSIS — J449 Chronic obstructive pulmonary disease, unspecified: Secondary | ICD-10-CM | POA: Diagnosis not present

## 2017-12-02 DIAGNOSIS — Z510 Encounter for antineoplastic radiation therapy: Secondary | ICD-10-CM | POA: Diagnosis not present

## 2017-12-02 DIAGNOSIS — C3411 Malignant neoplasm of upper lobe, right bronchus or lung: Secondary | ICD-10-CM | POA: Diagnosis not present

## 2017-12-02 DIAGNOSIS — R918 Other nonspecific abnormal finding of lung field: Secondary | ICD-10-CM | POA: Diagnosis not present

## 2017-12-02 DIAGNOSIS — G4733 Obstructive sleep apnea (adult) (pediatric): Secondary | ICD-10-CM | POA: Diagnosis not present

## 2017-12-02 NOTE — Telephone Encounter (Signed)
Patient and patient's wife unavailable as they are out at another doctor's appointment. Asked their daughter, Darrick Penna, to have one of them call me back to discuss further.

## 2017-12-02 NOTE — Telephone Encounter (Signed)
Patients wife states doctor ordered Delene Loll but Marchelle Gearing does not want to pay for it. They supposedly sent paperwork to be signed and then will pay. Can we locate paperwork, patient states there is aphone number we have to call on it.

## 2017-12-03 DIAGNOSIS — C3411 Malignant neoplasm of upper lobe, right bronchus or lung: Secondary | ICD-10-CM | POA: Diagnosis not present

## 2017-12-03 DIAGNOSIS — Z510 Encounter for antineoplastic radiation therapy: Secondary | ICD-10-CM | POA: Diagnosis not present

## 2017-12-03 NOTE — Telephone Encounter (Signed)
Patient's spouse returned your call, please call back at [phone]

## 2017-12-04 DIAGNOSIS — C3411 Malignant neoplasm of upper lobe, right bronchus or lung: Secondary | ICD-10-CM | POA: Diagnosis not present

## 2017-12-04 DIAGNOSIS — Z510 Encounter for antineoplastic radiation therapy: Secondary | ICD-10-CM | POA: Diagnosis not present

## 2017-12-05 DIAGNOSIS — Z79899 Other long term (current) drug therapy: Secondary | ICD-10-CM | POA: Diagnosis not present

## 2017-12-05 DIAGNOSIS — E538 Deficiency of other specified B group vitamins: Secondary | ICD-10-CM | POA: Diagnosis not present

## 2017-12-05 DIAGNOSIS — C3411 Malignant neoplasm of upper lobe, right bronchus or lung: Secondary | ICD-10-CM | POA: Diagnosis not present

## 2017-12-05 MED ORDER — SACUBITRIL-VALSARTAN 49-51 MG PO TABS: 1.0000 | ORAL_TABLET | Freq: Two times a day (BID) | ORAL | 3 refills | Status: DC

## 2017-12-05 MED ORDER — SACUBITRIL-VALSARTAN 49-51 MG PO TABS: 1.0000 | ORAL_TABLET | Freq: Two times a day (BID) | ORAL | 0 refills | Status: DC

## 2017-12-05 NOTE — Addendum Note (Signed)
Addended by: Stevan Born on: 12/05/2017 04:49 PM   Modules accepted: Orders

## 2017-12-05 NOTE — Telephone Encounter (Signed)
Refill for 30 day supply of entresto 49-51 mg has been sent to the [facility]  in [place] as requested by patient's wife, Velva Harman. Velva Harman explained that a prior authorization for entresto needs to be completed. Will call the phone number provided to complete PA today, [phone]. No further questions.

## 2017-12-05 NOTE — Addendum Note (Signed)
Addended by: Austin Miles on: 12/05/2017 08:42 AM   Modules accepted: Orders

## 2017-12-05 NOTE — Telephone Encounter (Signed)
Prior authorization has been completed and approved for entresto. Patient informed and prescription sent to [facility] as requested. No further questions.

## 2017-12-06 DIAGNOSIS — C3411 Malignant neoplasm of upper lobe, right bronchus or lung: Secondary | ICD-10-CM | POA: Diagnosis not present

## 2017-12-06 DIAGNOSIS — Z510 Encounter for antineoplastic radiation therapy: Secondary | ICD-10-CM | POA: Diagnosis not present

## 2017-12-07 DIAGNOSIS — C3411 Malignant neoplasm of upper lobe, right bronchus or lung: Secondary | ICD-10-CM | POA: Diagnosis not present

## 2017-12-07 DIAGNOSIS — Z510 Encounter for antineoplastic radiation therapy: Secondary | ICD-10-CM | POA: Diagnosis not present

## 2017-12-09 DIAGNOSIS — C3411 Malignant neoplasm of upper lobe, right bronchus or lung: Secondary | ICD-10-CM | POA: Diagnosis not present

## 2017-12-09 DIAGNOSIS — Z510 Encounter for antineoplastic radiation therapy: Secondary | ICD-10-CM | POA: Diagnosis not present

## 2017-12-10 DIAGNOSIS — Z510 Encounter for antineoplastic radiation therapy: Secondary | ICD-10-CM | POA: Diagnosis not present

## 2017-12-10 DIAGNOSIS — C3411 Malignant neoplasm of upper lobe, right bronchus or lung: Secondary | ICD-10-CM | POA: Diagnosis not present

## 2017-12-11 ENCOUNTER — Telehealth: Payer: Self-pay | Admitting: *Deleted

## 2017-12-11 DIAGNOSIS — Z510 Encounter for antineoplastic radiation therapy: Secondary | ICD-10-CM | POA: Diagnosis not present

## 2017-12-11 DIAGNOSIS — C3411 Malignant neoplasm of upper lobe, right bronchus or lung: Secondary | ICD-10-CM | POA: Diagnosis not present

## 2017-12-11 DIAGNOSIS — Z5111 Encounter for antineoplastic chemotherapy: Secondary | ICD-10-CM | POA: Diagnosis not present

## 2017-12-11 NOTE — Telephone Encounter (Signed)
Patient informed that he has been approved for patient assistance for entresto and he can pick up his prescription from his pharmacy at this time. Patient verbalized understanding. No further questions.

## 2017-12-16 DIAGNOSIS — C3411 Malignant neoplasm of upper lobe, right bronchus or lung: Secondary | ICD-10-CM | POA: Diagnosis not present

## 2017-12-16 DIAGNOSIS — Z510 Encounter for antineoplastic radiation therapy: Secondary | ICD-10-CM | POA: Diagnosis not present

## 2017-12-17 DIAGNOSIS — C3411 Malignant neoplasm of upper lobe, right bronchus or lung: Secondary | ICD-10-CM | POA: Diagnosis not present

## 2017-12-17 DIAGNOSIS — Z510 Encounter for antineoplastic radiation therapy: Secondary | ICD-10-CM | POA: Diagnosis not present

## 2017-12-18 DIAGNOSIS — C3411 Malignant neoplasm of upper lobe, right bronchus or lung: Secondary | ICD-10-CM | POA: Diagnosis not present

## 2017-12-18 DIAGNOSIS — Z510 Encounter for antineoplastic radiation therapy: Secondary | ICD-10-CM | POA: Diagnosis not present

## 2017-12-19 DIAGNOSIS — R739 Hyperglycemia, unspecified: Secondary | ICD-10-CM | POA: Diagnosis not present

## 2017-12-19 DIAGNOSIS — R0609 Other forms of dyspnea: Secondary | ICD-10-CM | POA: Diagnosis not present

## 2017-12-19 DIAGNOSIS — I509 Heart failure, unspecified: Secondary | ICD-10-CM | POA: Diagnosis not present

## 2017-12-19 DIAGNOSIS — G629 Polyneuropathy, unspecified: Secondary | ICD-10-CM | POA: Diagnosis not present

## 2017-12-19 DIAGNOSIS — C3411 Malignant neoplasm of upper lobe, right bronchus or lung: Secondary | ICD-10-CM | POA: Diagnosis not present

## 2017-12-19 DIAGNOSIS — N189 Chronic kidney disease, unspecified: Secondary | ICD-10-CM

## 2017-12-19 DIAGNOSIS — D61810 Antineoplastic chemotherapy induced pancytopenia: Secondary | ICD-10-CM | POA: Diagnosis not present

## 2017-12-20 DIAGNOSIS — Z510 Encounter for antineoplastic radiation therapy: Secondary | ICD-10-CM | POA: Diagnosis not present

## 2017-12-20 DIAGNOSIS — C3411 Malignant neoplasm of upper lobe, right bronchus or lung: Secondary | ICD-10-CM | POA: Diagnosis not present

## 2017-12-23 DIAGNOSIS — Z510 Encounter for antineoplastic radiation therapy: Secondary | ICD-10-CM | POA: Diagnosis not present

## 2017-12-23 DIAGNOSIS — C3411 Malignant neoplasm of upper lobe, right bronchus or lung: Secondary | ICD-10-CM | POA: Diagnosis not present

## 2017-12-24 DIAGNOSIS — C3411 Malignant neoplasm of upper lobe, right bronchus or lung: Secondary | ICD-10-CM | POA: Diagnosis not present

## 2017-12-24 DIAGNOSIS — Z510 Encounter for antineoplastic radiation therapy: Secondary | ICD-10-CM | POA: Diagnosis not present

## 2017-12-25 DIAGNOSIS — Z510 Encounter for antineoplastic radiation therapy: Secondary | ICD-10-CM | POA: Diagnosis not present

## 2017-12-25 DIAGNOSIS — C3411 Malignant neoplasm of upper lobe, right bronchus or lung: Secondary | ICD-10-CM | POA: Diagnosis not present

## 2017-12-26 DIAGNOSIS — C61 Malignant neoplasm of prostate: Secondary | ICD-10-CM | POA: Diagnosis not present

## 2017-12-26 DIAGNOSIS — C3411 Malignant neoplasm of upper lobe, right bronchus or lung: Secondary | ICD-10-CM | POA: Diagnosis not present

## 2017-12-26 DIAGNOSIS — Z5111 Encounter for antineoplastic chemotherapy: Secondary | ICD-10-CM | POA: Diagnosis not present

## 2017-12-27 DIAGNOSIS — Z510 Encounter for antineoplastic radiation therapy: Secondary | ICD-10-CM | POA: Diagnosis not present

## 2017-12-27 DIAGNOSIS — C3411 Malignant neoplasm of upper lobe, right bronchus or lung: Secondary | ICD-10-CM | POA: Diagnosis not present

## 2017-12-30 DIAGNOSIS — C3411 Malignant neoplasm of upper lobe, right bronchus or lung: Secondary | ICD-10-CM | POA: Diagnosis not present

## 2017-12-30 NOTE — Progress Notes (Deleted)
Cardiology Office Note:    Date:  12/30/2017   ID:  John Maddox, DOB 1940/12/29, MRN [id number]  PCP:  Renaldo Reel, PA  Cardiologist:  Shirlee More, MD    Referring MD: Renaldo Reel, PA    ASSESSMENT:    No diagnosis found. PLAN:    In order of problems listed above:  1. ***   Next appointment: ***   Medication Adjustments/Labs and Tests Ordered: Current medicines are reviewed at length with the patient today.  Concerns regarding medicines are outlined above.  No orders of the defined types were placed in this encounter.  No orders of the defined types were placed in this encounter.   No chief complaint on file.   History of Present Illness:    John Maddox is a 77 y.o. male with a hx of PVC's, hypertension hyperlipidemia and peripheral arterial disease with endovascular repair abdominal aortic aneurysm 2006 and left external iliac artery stent in 2000. His last seen by vascular surgery September 2018 and had mild bilateral carotid artery stenosis  last seen 10/03/17.   He was  admitted to [facility] 09/27/2017 with chest pain no evidence of acute coronary syndrome although his EKGs did show frequent PVCs echocardiogram suggested mild LV dysfunction EF 45 to 50% myocardial perfusion study showed no ischemia EF calculated 37% cardiac enzymes were normal proBNP level was normal and chest x-ray and CT scan showed the presence of a right lung mass suggestive of malignancy.  He also had aneurysm descending thoracic aorta 4.5 cm extending into the suprarenal abdominal aorta.  His renal function on admission to the hospital was normal creatinine 1.1 calculated GFR greater than 60 cc CBC was normal.  EKG showed sinus rhythm with APCs PVCs but no acute ischemic changes.  Interestingly during his myocardial perfusion study no arrhythmia was seen.   He was last seen 11/19/2017.  He is ambulatory heart rhythm monitor showed frequent supraventricular ectopy with brief  runs of atrial tachycardia and he had been found to have a lung mass and lung cancer.  He was objectively improved after transition from [facility] to [facility] and his dose was up titrated.  Compliance with diet, lifestyle and medications: *** Past Medical History:  Diagnosis Date  . Arrhythmia   . Asthma 09/23/2017  . Congestive heart failure ([place]) 09/23/2017  . COPD (chronic obstructive pulmonary disease) ([place]) 09/23/2017  . Diabetes mellitus ([place]) 09/23/2017  . Diastolic dysfunction 0/08/6576  . Esophageal reflux 09/23/2017  . Fatigue 09/23/2017  . Gout 09/23/2017  . History of prostate cancer 09/23/2017  . Hyperlipidemia 09/23/2017  . Hypertension 09/23/2017  . PVC's (premature ventricular contractions) 09/23/2017    Past Surgical History:  Procedure Laterality Date  . ABDOMINAL AORTIC ANEURYSM REPAIR    . AORTA - ILIAC ARTERY BYPASS GRAFT    . BACK SURGERY    . CATARACT EXTRACTION, BILATERAL    . CHOLECYSTECTOMY    . HEMICOLECTOMY    . HERNIA REPAIR    . PROSTATE SURGERY    . REPLACEMENT TOTAL KNEE    . STENT PLACEMENT VASCULAR ([place] [facility])    . TONSILLECTOMY      Current Medications: No outpatient medications have been marked as taking for the 01/01/18 encounter (Appointment) with Richardo Priest, MD.     Allergies:   Patient has no known allergies.   Social History   Socioeconomic History  . Marital status: Married    Spouse name: Not on file  . Number of children:  Not on file  . Years of education: Not on file  . Highest education level: Not on file  Occupational History  . Not on file  Social Needs  . Financial resource strain: Not on file  . Food insecurity:    Worry: Not on file    Inability: Not on file  . Transportation needs:    Medical: Not on file    Non-medical: Not on file  Tobacco Use  . Smoking status: Former Smoker    Packs/day: 2.50    Years: 40.00    Pack years: 100.00    Types: Cigarettes    Last attempt to quit: 2014    Years since quitting: 5.9  . Smokeless  tobacco: Never Used  Substance and Sexual Activity  . Alcohol use: Not Currently  . Drug use: Not Currently  . Sexual activity: Not on file  Lifestyle  . Physical activity:    Days per week: Not on file    Minutes per session: Not on file  . Stress: Not on file  Relationships  . Social connections:    Talks on phone: Not on file    Gets together: Not on file    Attends religious service: Not on file    Active member of club or organization: Not on file    Attends meetings of clubs or organizations: Not on file    Relationship status: Not on file  Other Topics Concern  . Not on file  Social History Narrative  . Not on file     Family History: The patient's ***family history includes Alcohol abuse in his father; Arthritis in his brother, father, and mother; Heart attack in his father; Heart disease in his father; Hyperlipidemia in his father; Hypertension in his father and mother; Lung cancer in his brother and mother; Prostate cancer in his father. ROS:   Please see the history of present illness.    All other systems reviewed and are negative.  EKGs/Labs/Other Studies Reviewed:    The following studies were reviewed today:  EKG:  EKG ordered today.  The ekg ordered today demonstrates ***  Recent Labs: No results found for requested labs within last 8760 hours.  Recent Lipid Panel No results found for: CHOL, TRIG, HDL, CHOLHDL, VLDL, LDLCALC, LDLDIRECT  Physical Exam:    VS:  There were no vitals taken for this visit.    Wt Readings from Last 3 Encounters:  11/19/17 236 lb 6.4 oz (107.2 kg)  10/03/17 242 lb 6.4 oz (110 kg)     GEN: *** Well nourished, well developed in no acute distress HEENT: Normal NECK: No JVD; No carotid bruits LYMPHATICS: No lymphadenopathy CARDIAC: ***RRR, no murmurs, rubs, gallops RESPIRATORY:  Clear to auscultation without rales, wheezing or rhonchi  ABDOMEN: Soft, non-tender, non-distended MUSCULOSKELETAL:  No edema; No deformity    SKIN: Warm and dry NEUROLOGIC:  Alert and oriented x 3 PSYCHIATRIC:  Normal affect    Signed, Shirlee More, MD  12/30/2017 12:58 PM    [facility]

## 2017-12-31 DIAGNOSIS — C3411 Malignant neoplasm of upper lobe, right bronchus or lung: Secondary | ICD-10-CM | POA: Diagnosis not present

## 2018-01-01 ENCOUNTER — Ambulatory Visit: Payer: Medicare Other | Admitting: Cardiology

## 2018-01-01 DIAGNOSIS — R0602 Shortness of breath: Secondary | ICD-10-CM | POA: Diagnosis not present

## 2018-01-01 DIAGNOSIS — R531 Weakness: Secondary | ICD-10-CM | POA: Diagnosis not present

## 2018-01-01 DIAGNOSIS — R0902 Hypoxemia: Secondary | ICD-10-CM | POA: Diagnosis not present

## 2018-01-01 DIAGNOSIS — R000 Tachycardia, unspecified: Secondary | ICD-10-CM | POA: Diagnosis not present

## 2018-01-02 DIAGNOSIS — I509 Heart failure, unspecified: Secondary | ICD-10-CM | POA: Diagnosis not present

## 2018-01-02 DIAGNOSIS — D649 Anemia, unspecified: Secondary | ICD-10-CM | POA: Diagnosis not present

## 2018-01-02 DIAGNOSIS — E46 Unspecified protein-calorie malnutrition: Secondary | ICD-10-CM | POA: Diagnosis not present

## 2018-01-02 DIAGNOSIS — Z923 Personal history of irradiation: Secondary | ICD-10-CM | POA: Diagnosis not present

## 2018-01-02 DIAGNOSIS — N189 Chronic kidney disease, unspecified: Secondary | ICD-10-CM | POA: Diagnosis not present

## 2018-01-02 DIAGNOSIS — E119 Type 2 diabetes mellitus without complications: Secondary | ICD-10-CM | POA: Diagnosis not present

## 2018-01-02 DIAGNOSIS — R918 Other nonspecific abnormal finding of lung field: Secondary | ICD-10-CM | POA: Diagnosis not present

## 2018-01-02 DIAGNOSIS — C3411 Malignant neoplasm of upper lobe, right bronchus or lung: Secondary | ICD-10-CM | POA: Diagnosis not present

## 2018-01-02 DIAGNOSIS — I714 Abdominal aortic aneurysm, without rupture: Secondary | ICD-10-CM | POA: Diagnosis not present

## 2018-01-02 DIAGNOSIS — J449 Chronic obstructive pulmonary disease, unspecified: Secondary | ICD-10-CM | POA: Diagnosis not present

## 2018-01-02 DIAGNOSIS — D6481 Anemia due to antineoplastic chemotherapy: Secondary | ICD-10-CM | POA: Diagnosis not present

## 2018-01-02 DIAGNOSIS — I10 Essential (primary) hypertension: Secondary | ICD-10-CM | POA: Diagnosis not present

## 2018-01-02 DIAGNOSIS — Z9221 Personal history of antineoplastic chemotherapy: Secondary | ICD-10-CM | POA: Diagnosis not present

## 2018-01-02 DIAGNOSIS — R509 Fever, unspecified: Secondary | ICD-10-CM

## 2018-01-02 DIAGNOSIS — Z8546 Personal history of malignant neoplasm of prostate: Secondary | ICD-10-CM

## 2018-01-02 DIAGNOSIS — E871 Hypo-osmolality and hyponatremia: Secondary | ICD-10-CM | POA: Diagnosis not present

## 2018-01-02 DIAGNOSIS — E785 Hyperlipidemia, unspecified: Secondary | ICD-10-CM | POA: Diagnosis not present

## 2018-01-03 DIAGNOSIS — Z95828 Presence of other vascular implants and grafts: Secondary | ICD-10-CM | POA: Diagnosis not present

## 2018-01-03 DIAGNOSIS — K219 Gastro-esophageal reflux disease without esophagitis: Secondary | ICD-10-CM

## 2018-01-03 DIAGNOSIS — J9621 Acute and chronic respiratory failure with hypoxia: Secondary | ICD-10-CM | POA: Diagnosis not present

## 2018-01-03 DIAGNOSIS — E871 Hypo-osmolality and hyponatremia: Secondary | ICD-10-CM | POA: Diagnosis not present

## 2018-01-03 DIAGNOSIS — M25562 Pain in left knee: Secondary | ICD-10-CM | POA: Diagnosis not present

## 2018-01-03 DIAGNOSIS — C3490 Malignant neoplasm of unspecified part of unspecified bronchus or lung: Secondary | ICD-10-CM | POA: Diagnosis not present

## 2018-01-03 DIAGNOSIS — I5030 Unspecified diastolic (congestive) heart failure: Secondary | ICD-10-CM | POA: Diagnosis not present

## 2018-01-03 DIAGNOSIS — D649 Anemia, unspecified: Secondary | ICD-10-CM | POA: Diagnosis not present

## 2018-01-04 ENCOUNTER — Inpatient Hospital Stay (HOSPITAL_COMMUNITY)
Admission: AD | Admit: 2018-01-04 | Payer: Self-pay | Source: Other Acute Inpatient Hospital | Admitting: Family Medicine

## 2018-01-04 DIAGNOSIS — Z8546 Personal history of malignant neoplasm of prostate: Secondary | ICD-10-CM | POA: Diagnosis not present

## 2018-01-04 DIAGNOSIS — Z955 Presence of coronary angioplasty implant and graft: Secondary | ICD-10-CM | POA: Diagnosis not present

## 2018-01-04 DIAGNOSIS — I5032 Chronic diastolic (congestive) heart failure: Secondary | ICD-10-CM | POA: Diagnosis not present

## 2018-01-04 DIAGNOSIS — E1169 Type 2 diabetes mellitus with other specified complication: Secondary | ICD-10-CM | POA: Diagnosis not present

## 2018-01-04 DIAGNOSIS — D696 Thrombocytopenia, unspecified: Secondary | ICD-10-CM | POA: Diagnosis not present

## 2018-01-04 DIAGNOSIS — M009 Pyogenic arthritis, unspecified: Secondary | ICD-10-CM | POA: Diagnosis not present

## 2018-01-04 DIAGNOSIS — E669 Obesity, unspecified: Secondary | ICD-10-CM | POA: Diagnosis present

## 2018-01-04 DIAGNOSIS — I6523 Occlusion and stenosis of bilateral carotid arteries: Secondary | ICD-10-CM | POA: Diagnosis not present

## 2018-01-04 DIAGNOSIS — G9340 Encephalopathy, unspecified: Secondary | ICD-10-CM | POA: Diagnosis not present

## 2018-01-04 DIAGNOSIS — I509 Heart failure, unspecified: Secondary | ICD-10-CM | POA: Diagnosis not present

## 2018-01-04 DIAGNOSIS — E1140 Type 2 diabetes mellitus with diabetic neuropathy, unspecified: Secondary | ICD-10-CM | POA: Diagnosis not present

## 2018-01-04 DIAGNOSIS — Z7951 Long term (current) use of inhaled steroids: Secondary | ICD-10-CM | POA: Diagnosis not present

## 2018-01-04 DIAGNOSIS — E876 Hypokalemia: Secondary | ICD-10-CM | POA: Diagnosis not present

## 2018-01-04 DIAGNOSIS — E871 Hypo-osmolality and hyponatremia: Secondary | ICD-10-CM | POA: Diagnosis not present

## 2018-01-04 DIAGNOSIS — N179 Acute kidney failure, unspecified: Secondary | ICD-10-CM | POA: Diagnosis not present

## 2018-01-04 DIAGNOSIS — T508X5A Adverse effect of diagnostic agents, initial encounter: Secondary | ICD-10-CM | POA: Diagnosis not present

## 2018-01-04 DIAGNOSIS — Z452 Encounter for adjustment and management of vascular access device: Secondary | ICD-10-CM | POA: Diagnosis not present

## 2018-01-04 DIAGNOSIS — Z7401 Bed confinement status: Secondary | ICD-10-CM | POA: Diagnosis not present

## 2018-01-04 DIAGNOSIS — I5033 Acute on chronic diastolic (congestive) heart failure: Secondary | ICD-10-CM | POA: Diagnosis not present

## 2018-01-04 DIAGNOSIS — J449 Chronic obstructive pulmonary disease, unspecified: Secondary | ICD-10-CM | POA: Diagnosis present

## 2018-01-04 DIAGNOSIS — T8454XA Infection and inflammatory reaction due to internal left knee prosthesis, initial encounter: Secondary | ICD-10-CM | POA: Diagnosis present

## 2018-01-04 DIAGNOSIS — Z792 Long term (current) use of antibiotics: Secondary | ICD-10-CM | POA: Diagnosis not present

## 2018-01-04 DIAGNOSIS — T39395A Adverse effect of other nonsteroidal anti-inflammatory drugs [NSAID], initial encounter: Secondary | ICD-10-CM | POA: Diagnosis not present

## 2018-01-04 DIAGNOSIS — I110 Hypertensive heart disease with heart failure: Secondary | ICD-10-CM | POA: Diagnosis present

## 2018-01-04 DIAGNOSIS — Z992 Dependence on renal dialysis: Secondary | ICD-10-CM | POA: Diagnosis not present

## 2018-01-04 DIAGNOSIS — M1712 Unilateral primary osteoarthritis, left knee: Secondary | ICD-10-CM | POA: Diagnosis not present

## 2018-01-04 DIAGNOSIS — Z794 Long term (current) use of insulin: Secondary | ICD-10-CM | POA: Diagnosis not present

## 2018-01-04 DIAGNOSIS — Z7982 Long term (current) use of aspirin: Secondary | ICD-10-CM | POA: Diagnosis not present

## 2018-01-04 DIAGNOSIS — J9621 Acute and chronic respiratory failure with hypoxia: Secondary | ICD-10-CM | POA: Diagnosis not present

## 2018-01-04 DIAGNOSIS — M2550 Pain in unspecified joint: Secondary | ICD-10-CM | POA: Diagnosis not present

## 2018-01-04 DIAGNOSIS — Z9981 Dependence on supplemental oxygen: Secondary | ICD-10-CM | POA: Diagnosis not present

## 2018-01-04 DIAGNOSIS — R3129 Other microscopic hematuria: Secondary | ICD-10-CM | POA: Diagnosis not present

## 2018-01-04 DIAGNOSIS — E8770 Fluid overload, unspecified: Secondary | ICD-10-CM | POA: Diagnosis not present

## 2018-01-04 DIAGNOSIS — K219 Gastro-esophageal reflux disease without esophagitis: Secondary | ICD-10-CM | POA: Diagnosis not present

## 2018-01-04 DIAGNOSIS — M25462 Effusion, left knee: Secondary | ICD-10-CM | POA: Diagnosis not present

## 2018-01-04 DIAGNOSIS — Z6832 Body mass index (BMI) 32.0-32.9, adult: Secondary | ICD-10-CM | POA: Diagnosis not present

## 2018-01-04 DIAGNOSIS — D6959 Other secondary thrombocytopenia: Secondary | ICD-10-CM | POA: Diagnosis not present

## 2018-01-04 DIAGNOSIS — C3490 Malignant neoplasm of unspecified part of unspecified bronchus or lung: Secondary | ICD-10-CM | POA: Diagnosis not present

## 2018-01-04 DIAGNOSIS — D638 Anemia in other chronic diseases classified elsewhere: Secondary | ICD-10-CM | POA: Diagnosis present

## 2018-01-04 DIAGNOSIS — M00862 Arthritis due to other bacteria, left knee: Secondary | ICD-10-CM | POA: Diagnosis not present

## 2018-01-04 DIAGNOSIS — Z87891 Personal history of nicotine dependence: Secondary | ICD-10-CM | POA: Diagnosis not present

## 2018-01-04 DIAGNOSIS — A419 Sepsis, unspecified organism: Secondary | ICD-10-CM | POA: Diagnosis not present

## 2018-01-04 DIAGNOSIS — J9601 Acute respiratory failure with hypoxia: Secondary | ICD-10-CM | POA: Diagnosis not present

## 2018-01-04 DIAGNOSIS — I5030 Unspecified diastolic (congestive) heart failure: Secondary | ICD-10-CM | POA: Diagnosis not present

## 2018-01-04 DIAGNOSIS — D6481 Anemia due to antineoplastic chemotherapy: Secondary | ICD-10-CM | POA: Diagnosis present

## 2018-01-04 DIAGNOSIS — C3430 Malignant neoplasm of lower lobe, unspecified bronchus or lung: Secondary | ICD-10-CM | POA: Diagnosis not present

## 2018-01-04 DIAGNOSIS — T451X5A Adverse effect of antineoplastic and immunosuppressive drugs, initial encounter: Secondary | ICD-10-CM | POA: Diagnosis not present

## 2018-01-04 DIAGNOSIS — E785 Hyperlipidemia, unspecified: Secondary | ICD-10-CM | POA: Diagnosis present

## 2018-01-04 DIAGNOSIS — I5042 Chronic combined systolic (congestive) and diastolic (congestive) heart failure: Secondary | ICD-10-CM | POA: Diagnosis present

## 2018-01-04 DIAGNOSIS — I714 Abdominal aortic aneurysm, without rupture: Secondary | ICD-10-CM | POA: Diagnosis not present

## 2018-01-04 DIAGNOSIS — Z95828 Presence of other vascular implants and grafts: Secondary | ICD-10-CM | POA: Diagnosis not present

## 2018-01-04 DIAGNOSIS — I5040 Unspecified combined systolic (congestive) and diastolic (congestive) heart failure: Secondary | ICD-10-CM | POA: Diagnosis not present

## 2018-01-04 DIAGNOSIS — E1151 Type 2 diabetes mellitus with diabetic peripheral angiopathy without gangrene: Secondary | ICD-10-CM | POA: Diagnosis not present

## 2018-01-04 DIAGNOSIS — M109 Gout, unspecified: Secondary | ICD-10-CM | POA: Diagnosis present

## 2018-01-04 DIAGNOSIS — R5381 Other malaise: Secondary | ICD-10-CM | POA: Diagnosis present

## 2018-01-04 DIAGNOSIS — D649 Anemia, unspecified: Secondary | ICD-10-CM | POA: Diagnosis not present

## 2018-01-04 DIAGNOSIS — Z96659 Presence of unspecified artificial knee joint: Secondary | ICD-10-CM | POA: Diagnosis not present

## 2018-01-04 DIAGNOSIS — Z9181 History of falling: Secondary | ICD-10-CM | POA: Diagnosis not present

## 2018-01-04 DIAGNOSIS — Z96652 Presence of left artificial knee joint: Secondary | ICD-10-CM | POA: Diagnosis not present

## 2018-01-04 DIAGNOSIS — E7800 Pure hypercholesterolemia, unspecified: Secondary | ICD-10-CM | POA: Diagnosis not present

## 2018-01-04 DIAGNOSIS — G4730 Sleep apnea, unspecified: Secondary | ICD-10-CM | POA: Diagnosis present

## 2018-01-04 DIAGNOSIS — M25562 Pain in left knee: Secondary | ICD-10-CM | POA: Diagnosis not present

## 2018-01-04 DIAGNOSIS — R197 Diarrhea, unspecified: Secondary | ICD-10-CM | POA: Diagnosis not present

## 2018-01-04 DIAGNOSIS — R319 Hematuria, unspecified: Secondary | ICD-10-CM | POA: Diagnosis not present

## 2018-01-04 HISTORY — DX: Pyogenic arthritis, unspecified: M00.9

## 2018-01-08 DIAGNOSIS — N179 Acute kidney failure, unspecified: Secondary | ICD-10-CM

## 2018-01-08 HISTORY — DX: Acute kidney failure, unspecified: N17.9

## 2018-01-14 ENCOUNTER — Telehealth: Payer: Self-pay | Admitting: Cardiology

## 2018-01-14 DIAGNOSIS — R197 Diarrhea, unspecified: Secondary | ICD-10-CM

## 2018-01-14 DIAGNOSIS — R319 Hematuria, unspecified: Secondary | ICD-10-CM

## 2018-01-14 DIAGNOSIS — E876 Hypokalemia: Secondary | ICD-10-CM

## 2018-01-14 HISTORY — DX: Hematuria, unspecified: R31.9

## 2018-01-14 HISTORY — DX: Hypomagnesemia: E83.42

## 2018-01-14 HISTORY — DX: Hypokalemia: E87.6

## 2018-01-14 HISTORY — DX: Diarrhea, unspecified: R19.7

## 2018-01-14 NOTE — Telephone Encounter (Signed)
John Maddox called to office explaining that her husband had a chemo treatment and then ended up in [place] with infection in his knee. He is still currently admitted to [facility] and they have stopped Entresto due to elevated creatinine level. John Maddox states that she is very concerned with his care that he is receiving and would like your recommendation as to what needs to be done.  She states his BP is running in 150's/90's today and he is having increased swelling in both legs and ankles.  Please advise.

## 2018-01-14 NOTE — Telephone Encounter (Signed)
Left message for patient's wife, Velva Harman, per DPR to return call.

## 2018-01-14 NOTE — Telephone Encounter (Signed)
Patient's wife, Velva Harman called EXTREMELY upset.  Patient has been going through chemo and and got an infection in his knee. Patient has been admitted to [facility] has since suffered kidney failure.  The docs at [facility] have taken patient off Delene Loll insisting that is what what caused the kidney failure. Patient's wife states states the Delene Loll was the only thing that helped the patient's heart issues and now he is having heart issues.  Patient's wife is wanting a return call to discuss these issues. She is very upset and says they are going to kill him if Dr Bettina Gavia does not step in....  She can be

## 2018-01-15 DIAGNOSIS — D696 Thrombocytopenia, unspecified: Secondary | ICD-10-CM

## 2018-01-15 HISTORY — DX: Other disorders of phosphorus metabolism: E83.39

## 2018-01-15 HISTORY — DX: Thrombocytopenia, unspecified: D69.6

## 2018-01-16 NOTE — Telephone Encounter (Signed)
Patient's wife Velva Harman notified that Dr Bettina Gavia has reviewed patients chart, and agrees with physicians plan while patient is in hospital.  Velva Harman advised that once patient has been discharged, Dr Bettina Gavia will see patient in office to adjust medications.   Velva Harman agreed to plan and verbalized understanding.

## 2018-01-16 NOTE — Telephone Encounter (Signed)
I reviewed the note I think the physicians are doing the right things while in hospital I would follow their direction and I can see them back in the office after discharge to readjust medications

## 2018-01-17 DIAGNOSIS — M009 Pyogenic arthritis, unspecified: Secondary | ICD-10-CM | POA: Diagnosis not present

## 2018-01-17 DIAGNOSIS — T8454XA Infection and inflammatory reaction due to internal left knee prosthesis, initial encounter: Secondary | ICD-10-CM | POA: Diagnosis not present

## 2018-01-17 DIAGNOSIS — I110 Hypertensive heart disease with heart failure: Secondary | ICD-10-CM | POA: Diagnosis not present

## 2018-01-17 DIAGNOSIS — E1151 Type 2 diabetes mellitus with diabetic peripheral angiopathy without gangrene: Secondary | ICD-10-CM | POA: Diagnosis not present

## 2018-01-17 DIAGNOSIS — I5042 Chronic combined systolic (congestive) and diastolic (congestive) heart failure: Secondary | ICD-10-CM | POA: Diagnosis not present

## 2018-01-17 DIAGNOSIS — J449 Chronic obstructive pulmonary disease, unspecified: Secondary | ICD-10-CM | POA: Diagnosis not present

## 2018-01-17 DIAGNOSIS — D649 Anemia, unspecified: Secondary | ICD-10-CM

## 2018-01-17 HISTORY — DX: Anemia, unspecified: D64.9

## 2018-01-19 DIAGNOSIS — J449 Chronic obstructive pulmonary disease, unspecified: Secondary | ICD-10-CM | POA: Diagnosis not present

## 2018-01-19 DIAGNOSIS — M009 Pyogenic arthritis, unspecified: Secondary | ICD-10-CM | POA: Diagnosis not present

## 2018-01-19 DIAGNOSIS — I110 Hypertensive heart disease with heart failure: Secondary | ICD-10-CM | POA: Diagnosis not present

## 2018-01-19 DIAGNOSIS — I5042 Chronic combined systolic (congestive) and diastolic (congestive) heart failure: Secondary | ICD-10-CM | POA: Diagnosis not present

## 2018-01-19 DIAGNOSIS — T8454XA Infection and inflammatory reaction due to internal left knee prosthesis, initial encounter: Secondary | ICD-10-CM | POA: Diagnosis not present

## 2018-01-19 DIAGNOSIS — E1151 Type 2 diabetes mellitus with diabetic peripheral angiopathy without gangrene: Secondary | ICD-10-CM | POA: Diagnosis not present

## 2018-01-20 DIAGNOSIS — I5042 Chronic combined systolic (congestive) and diastolic (congestive) heart failure: Secondary | ICD-10-CM | POA: Diagnosis not present

## 2018-01-20 DIAGNOSIS — E1151 Type 2 diabetes mellitus with diabetic peripheral angiopathy without gangrene: Secondary | ICD-10-CM | POA: Diagnosis not present

## 2018-01-20 DIAGNOSIS — T8454XA Infection and inflammatory reaction due to internal left knee prosthesis, initial encounter: Secondary | ICD-10-CM | POA: Diagnosis not present

## 2018-01-20 DIAGNOSIS — Z792 Long term (current) use of antibiotics: Secondary | ICD-10-CM | POA: Diagnosis not present

## 2018-01-20 DIAGNOSIS — I110 Hypertensive heart disease with heart failure: Secondary | ICD-10-CM | POA: Diagnosis not present

## 2018-01-20 DIAGNOSIS — T8454XS Infection and inflammatory reaction due to internal left knee prosthesis, sequela: Secondary | ICD-10-CM | POA: Diagnosis not present

## 2018-01-20 DIAGNOSIS — M009 Pyogenic arthritis, unspecified: Secondary | ICD-10-CM | POA: Diagnosis not present

## 2018-01-20 DIAGNOSIS — M109 Gout, unspecified: Secondary | ICD-10-CM | POA: Diagnosis not present

## 2018-01-20 DIAGNOSIS — J449 Chronic obstructive pulmonary disease, unspecified: Secondary | ICD-10-CM | POA: Diagnosis not present

## 2018-01-23 DIAGNOSIS — J449 Chronic obstructive pulmonary disease, unspecified: Secondary | ICD-10-CM | POA: Diagnosis not present

## 2018-01-23 DIAGNOSIS — M009 Pyogenic arthritis, unspecified: Secondary | ICD-10-CM | POA: Diagnosis not present

## 2018-01-23 DIAGNOSIS — I5042 Chronic combined systolic (congestive) and diastolic (congestive) heart failure: Secondary | ICD-10-CM | POA: Diagnosis not present

## 2018-01-23 DIAGNOSIS — E1151 Type 2 diabetes mellitus with diabetic peripheral angiopathy without gangrene: Secondary | ICD-10-CM | POA: Diagnosis not present

## 2018-01-23 DIAGNOSIS — I110 Hypertensive heart disease with heart failure: Secondary | ICD-10-CM | POA: Diagnosis not present

## 2018-01-23 DIAGNOSIS — T8454XA Infection and inflammatory reaction due to internal left knee prosthesis, initial encounter: Secondary | ICD-10-CM | POA: Diagnosis not present

## 2018-01-24 DIAGNOSIS — M009 Pyogenic arthritis, unspecified: Secondary | ICD-10-CM | POA: Diagnosis not present

## 2018-01-24 DIAGNOSIS — I5042 Chronic combined systolic (congestive) and diastolic (congestive) heart failure: Secondary | ICD-10-CM | POA: Diagnosis not present

## 2018-01-24 DIAGNOSIS — T8454XA Infection and inflammatory reaction due to internal left knee prosthesis, initial encounter: Secondary | ICD-10-CM | POA: Diagnosis not present

## 2018-01-24 DIAGNOSIS — I110 Hypertensive heart disease with heart failure: Secondary | ICD-10-CM | POA: Diagnosis not present

## 2018-01-24 DIAGNOSIS — E1151 Type 2 diabetes mellitus with diabetic peripheral angiopathy without gangrene: Secondary | ICD-10-CM | POA: Diagnosis not present

## 2018-01-24 DIAGNOSIS — J449 Chronic obstructive pulmonary disease, unspecified: Secondary | ICD-10-CM | POA: Diagnosis not present

## 2018-01-27 DIAGNOSIS — I110 Hypertensive heart disease with heart failure: Secondary | ICD-10-CM | POA: Diagnosis not present

## 2018-01-27 DIAGNOSIS — N182 Chronic kidney disease, stage 2 (mild): Secondary | ICD-10-CM | POA: Diagnosis not present

## 2018-01-27 DIAGNOSIS — E1122 Type 2 diabetes mellitus with diabetic chronic kidney disease: Secondary | ICD-10-CM | POA: Diagnosis not present

## 2018-01-27 DIAGNOSIS — M009 Pyogenic arthritis, unspecified: Secondary | ICD-10-CM | POA: Diagnosis not present

## 2018-01-27 DIAGNOSIS — E1151 Type 2 diabetes mellitus with diabetic peripheral angiopathy without gangrene: Secondary | ICD-10-CM | POA: Diagnosis not present

## 2018-01-27 DIAGNOSIS — I5042 Chronic combined systolic (congestive) and diastolic (congestive) heart failure: Secondary | ICD-10-CM | POA: Diagnosis not present

## 2018-01-27 DIAGNOSIS — C3411 Malignant neoplasm of upper lobe, right bronchus or lung: Secondary | ICD-10-CM | POA: Diagnosis not present

## 2018-01-27 DIAGNOSIS — M109 Gout, unspecified: Secondary | ICD-10-CM | POA: Diagnosis not present

## 2018-01-27 DIAGNOSIS — J449 Chronic obstructive pulmonary disease, unspecified: Secondary | ICD-10-CM | POA: Diagnosis not present

## 2018-01-27 DIAGNOSIS — C61 Malignant neoplasm of prostate: Secondary | ICD-10-CM | POA: Diagnosis not present

## 2018-01-27 DIAGNOSIS — C3490 Malignant neoplasm of unspecified part of unspecified bronchus or lung: Secondary | ICD-10-CM | POA: Diagnosis not present

## 2018-01-27 DIAGNOSIS — N189 Chronic kidney disease, unspecified: Secondary | ICD-10-CM | POA: Diagnosis not present

## 2018-01-27 DIAGNOSIS — T8454XA Infection and inflammatory reaction due to internal left knee prosthesis, initial encounter: Secondary | ICD-10-CM | POA: Diagnosis not present

## 2018-01-27 DIAGNOSIS — E119 Type 2 diabetes mellitus without complications: Secondary | ICD-10-CM | POA: Diagnosis not present

## 2018-01-27 DIAGNOSIS — D649 Anemia, unspecified: Secondary | ICD-10-CM | POA: Diagnosis not present

## 2018-01-28 DIAGNOSIS — I5042 Chronic combined systolic (congestive) and diastolic (congestive) heart failure: Secondary | ICD-10-CM | POA: Diagnosis not present

## 2018-01-28 DIAGNOSIS — J449 Chronic obstructive pulmonary disease, unspecified: Secondary | ICD-10-CM | POA: Diagnosis not present

## 2018-01-28 DIAGNOSIS — T8454XA Infection and inflammatory reaction due to internal left knee prosthesis, initial encounter: Secondary | ICD-10-CM | POA: Diagnosis not present

## 2018-01-28 DIAGNOSIS — M009 Pyogenic arthritis, unspecified: Secondary | ICD-10-CM | POA: Diagnosis not present

## 2018-01-28 DIAGNOSIS — I110 Hypertensive heart disease with heart failure: Secondary | ICD-10-CM | POA: Diagnosis not present

## 2018-01-28 DIAGNOSIS — E1151 Type 2 diabetes mellitus with diabetic peripheral angiopathy without gangrene: Secondary | ICD-10-CM | POA: Diagnosis not present

## 2018-01-30 DIAGNOSIS — D649 Anemia, unspecified: Secondary | ICD-10-CM | POA: Diagnosis not present

## 2018-01-30 DIAGNOSIS — J9621 Acute and chronic respiratory failure with hypoxia: Secondary | ICD-10-CM | POA: Diagnosis not present

## 2018-01-30 DIAGNOSIS — J449 Chronic obstructive pulmonary disease, unspecified: Secondary | ICD-10-CM | POA: Diagnosis not present

## 2018-01-30 DIAGNOSIS — I10 Essential (primary) hypertension: Secondary | ICD-10-CM | POA: Diagnosis not present

## 2018-01-30 DIAGNOSIS — K219 Gastro-esophageal reflux disease without esophagitis: Secondary | ICD-10-CM

## 2018-01-30 DIAGNOSIS — E119 Type 2 diabetes mellitus without complications: Secondary | ICD-10-CM | POA: Diagnosis not present

## 2018-01-30 DIAGNOSIS — N189 Chronic kidney disease, unspecified: Secondary | ICD-10-CM | POA: Diagnosis not present

## 2018-01-30 DIAGNOSIS — I5030 Unspecified diastolic (congestive) heart failure: Secondary | ICD-10-CM

## 2018-01-30 DIAGNOSIS — E785 Hyperlipidemia, unspecified: Secondary | ICD-10-CM | POA: Diagnosis not present

## 2018-01-31 DIAGNOSIS — E8770 Fluid overload, unspecified: Secondary | ICD-10-CM | POA: Diagnosis not present

## 2018-01-31 DIAGNOSIS — Z8546 Personal history of malignant neoplasm of prostate: Secondary | ICD-10-CM | POA: Diagnosis not present

## 2018-01-31 DIAGNOSIS — N189 Chronic kidney disease, unspecified: Secondary | ICD-10-CM | POA: Diagnosis not present

## 2018-01-31 DIAGNOSIS — I5030 Unspecified diastolic (congestive) heart failure: Secondary | ICD-10-CM | POA: Diagnosis not present

## 2018-01-31 DIAGNOSIS — R000 Tachycardia, unspecified: Secondary | ICD-10-CM | POA: Diagnosis not present

## 2018-01-31 DIAGNOSIS — I509 Heart failure, unspecified: Secondary | ICD-10-CM | POA: Diagnosis not present

## 2018-01-31 DIAGNOSIS — D649 Anemia, unspecified: Secondary | ICD-10-CM | POA: Diagnosis not present

## 2018-01-31 DIAGNOSIS — Z9981 Dependence on supplemental oxygen: Secondary | ICD-10-CM | POA: Diagnosis not present

## 2018-01-31 DIAGNOSIS — J181 Lobar pneumonia, unspecified organism: Secondary | ICD-10-CM | POA: Diagnosis not present

## 2018-01-31 DIAGNOSIS — I5043 Acute on chronic combined systolic (congestive) and diastolic (congestive) heart failure: Secondary | ICD-10-CM | POA: Diagnosis present

## 2018-01-31 DIAGNOSIS — D72819 Decreased white blood cell count, unspecified: Secondary | ICD-10-CM | POA: Diagnosis not present

## 2018-01-31 DIAGNOSIS — J9621 Acute and chronic respiratory failure with hypoxia: Secondary | ICD-10-CM | POA: Diagnosis not present

## 2018-01-31 DIAGNOSIS — C3492 Malignant neoplasm of unspecified part of left bronchus or lung: Secondary | ICD-10-CM | POA: Diagnosis present

## 2018-01-31 DIAGNOSIS — J449 Chronic obstructive pulmonary disease, unspecified: Secondary | ICD-10-CM | POA: Diagnosis present

## 2018-01-31 DIAGNOSIS — I130 Hypertensive heart and chronic kidney disease with heart failure and stage 1 through stage 4 chronic kidney disease, or unspecified chronic kidney disease: Secondary | ICD-10-CM | POA: Diagnosis present

## 2018-01-31 DIAGNOSIS — D500 Iron deficiency anemia secondary to blood loss (chronic): Secondary | ICD-10-CM | POA: Diagnosis not present

## 2018-01-31 DIAGNOSIS — D638 Anemia in other chronic diseases classified elsewhere: Secondary | ICD-10-CM | POA: Diagnosis present

## 2018-01-31 DIAGNOSIS — I714 Abdominal aortic aneurysm, without rupture: Secondary | ICD-10-CM | POA: Diagnosis present

## 2018-01-31 DIAGNOSIS — K279 Peptic ulcer, site unspecified, unspecified as acute or chronic, without hemorrhage or perforation: Secondary | ICD-10-CM | POA: Diagnosis present

## 2018-01-31 DIAGNOSIS — K219 Gastro-esophageal reflux disease without esophagitis: Secondary | ICD-10-CM | POA: Diagnosis not present

## 2018-01-31 DIAGNOSIS — Z7982 Long term (current) use of aspirin: Secondary | ICD-10-CM | POA: Diagnosis not present

## 2018-01-31 DIAGNOSIS — I739 Peripheral vascular disease, unspecified: Secondary | ICD-10-CM | POA: Diagnosis present

## 2018-01-31 DIAGNOSIS — D6481 Anemia due to antineoplastic chemotherapy: Secondary | ICD-10-CM | POA: Diagnosis present

## 2018-01-31 DIAGNOSIS — Z79891 Long term (current) use of opiate analgesic: Secondary | ICD-10-CM | POA: Diagnosis not present

## 2018-01-31 DIAGNOSIS — H9190 Unspecified hearing loss, unspecified ear: Secondary | ICD-10-CM | POA: Diagnosis present

## 2018-01-31 DIAGNOSIS — E876 Hypokalemia: Secondary | ICD-10-CM | POA: Diagnosis present

## 2018-01-31 DIAGNOSIS — Z792 Long term (current) use of antibiotics: Secondary | ICD-10-CM | POA: Diagnosis not present

## 2018-01-31 DIAGNOSIS — E119 Type 2 diabetes mellitus without complications: Secondary | ICD-10-CM | POA: Diagnosis not present

## 2018-01-31 DIAGNOSIS — I2510 Atherosclerotic heart disease of native coronary artery without angina pectoris: Secondary | ICD-10-CM | POA: Diagnosis present

## 2018-01-31 DIAGNOSIS — E785 Hyperlipidemia, unspecified: Secondary | ICD-10-CM | POA: Diagnosis not present

## 2018-01-31 DIAGNOSIS — M009 Pyogenic arthritis, unspecified: Secondary | ICD-10-CM | POA: Diagnosis not present

## 2018-01-31 DIAGNOSIS — N182 Chronic kidney disease, stage 2 (mild): Secondary | ICD-10-CM | POA: Diagnosis present

## 2018-01-31 DIAGNOSIS — Z9221 Personal history of antineoplastic chemotherapy: Secondary | ICD-10-CM | POA: Diagnosis not present

## 2018-01-31 DIAGNOSIS — E1122 Type 2 diabetes mellitus with diabetic chronic kidney disease: Secondary | ICD-10-CM | POA: Diagnosis present

## 2018-01-31 DIAGNOSIS — C3411 Malignant neoplasm of upper lobe, right bronchus or lung: Secondary | ICD-10-CM | POA: Diagnosis not present

## 2018-01-31 DIAGNOSIS — I10 Essential (primary) hypertension: Secondary | ICD-10-CM | POA: Diagnosis not present

## 2018-01-31 DIAGNOSIS — Z923 Personal history of irradiation: Secondary | ICD-10-CM | POA: Diagnosis not present

## 2018-01-31 DIAGNOSIS — R531 Weakness: Secondary | ICD-10-CM | POA: Diagnosis not present

## 2018-02-01 DIAGNOSIS — C3411 Malignant neoplasm of upper lobe, right bronchus or lung: Secondary | ICD-10-CM

## 2018-02-01 DIAGNOSIS — I509 Heart failure, unspecified: Secondary | ICD-10-CM

## 2018-02-01 DIAGNOSIS — M009 Pyogenic arthritis, unspecified: Secondary | ICD-10-CM

## 2018-02-01 DIAGNOSIS — N189 Chronic kidney disease, unspecified: Secondary | ICD-10-CM

## 2018-02-01 DIAGNOSIS — J449 Chronic obstructive pulmonary disease, unspecified: Secondary | ICD-10-CM

## 2018-02-01 DIAGNOSIS — D72819 Decreased white blood cell count, unspecified: Secondary | ICD-10-CM

## 2018-02-01 DIAGNOSIS — D649 Anemia, unspecified: Secondary | ICD-10-CM

## 2018-02-01 DIAGNOSIS — Z8546 Personal history of malignant neoplasm of prostate: Secondary | ICD-10-CM

## 2018-02-02 DIAGNOSIS — C3411 Malignant neoplasm of upper lobe, right bronchus or lung: Secondary | ICD-10-CM

## 2018-02-02 DIAGNOSIS — D649 Anemia, unspecified: Secondary | ICD-10-CM

## 2018-02-02 DIAGNOSIS — M009 Pyogenic arthritis, unspecified: Secondary | ICD-10-CM

## 2018-02-02 DIAGNOSIS — E8770 Fluid overload, unspecified: Secondary | ICD-10-CM

## 2018-02-03 DIAGNOSIS — D72819 Decreased white blood cell count, unspecified: Secondary | ICD-10-CM

## 2018-02-03 DIAGNOSIS — I509 Heart failure, unspecified: Secondary | ICD-10-CM

## 2018-02-03 DIAGNOSIS — E876 Hypokalemia: Secondary | ICD-10-CM

## 2018-02-03 DIAGNOSIS — C3411 Malignant neoplasm of upper lobe, right bronchus or lung: Secondary | ICD-10-CM

## 2018-02-03 DIAGNOSIS — Z8546 Personal history of malignant neoplasm of prostate: Secondary | ICD-10-CM

## 2018-02-03 DIAGNOSIS — M009 Pyogenic arthritis, unspecified: Secondary | ICD-10-CM

## 2018-02-03 DIAGNOSIS — N189 Chronic kidney disease, unspecified: Secondary | ICD-10-CM

## 2018-02-03 DIAGNOSIS — J449 Chronic obstructive pulmonary disease, unspecified: Secondary | ICD-10-CM

## 2018-02-04 ENCOUNTER — Encounter: Payer: Self-pay | Admitting: Internal Medicine

## 2018-02-06 ENCOUNTER — Telehealth: Payer: Self-pay | Admitting: *Deleted

## 2018-02-06 DIAGNOSIS — I5042 Chronic combined systolic (congestive) and diastolic (congestive) heart failure: Secondary | ICD-10-CM | POA: Diagnosis not present

## 2018-02-06 DIAGNOSIS — I110 Hypertensive heart disease with heart failure: Secondary | ICD-10-CM | POA: Diagnosis not present

## 2018-02-06 DIAGNOSIS — J449 Chronic obstructive pulmonary disease, unspecified: Secondary | ICD-10-CM | POA: Diagnosis not present

## 2018-02-06 DIAGNOSIS — T8454XA Infection and inflammatory reaction due to internal left knee prosthesis, initial encounter: Secondary | ICD-10-CM | POA: Diagnosis not present

## 2018-02-06 DIAGNOSIS — M009 Pyogenic arthritis, unspecified: Secondary | ICD-10-CM | POA: Diagnosis not present

## 2018-02-06 DIAGNOSIS — E1151 Type 2 diabetes mellitus with diabetic peripheral angiopathy without gangrene: Secondary | ICD-10-CM | POA: Diagnosis not present

## 2018-02-06 MED ORDER — SACUBITRIL-VALSARTAN 49-51 MG PO TABS: 1.0000 | ORAL_TABLET | Freq: Two times a day (BID) | ORAL | 0 refills | Status: DC

## 2018-02-06 NOTE — Telephone Encounter (Signed)
30 day supply of entresto sent to the [facility] in [place] with no refills. Patient is overdue for follow up. Note sent to pharmacy to make patient aware to call and schedule an appointment.

## 2018-02-06 NOTE — Telephone Encounter (Signed)
*  STAT* If patient is at the pharmacy, call can be transferred to refill team.   1. Which medications need to be refilled? (please list name of each medication and dose if known) Entresto 24-26 bid  2. Which pharmacy/location (including street and city if local pharmacy) is medication to be sent to?[facility] in [place]  3. Do they need a 30 day or 90 day supply? [place]

## 2018-02-06 NOTE — Addendum Note (Signed)
Addended by: Austin Miles on: 02/06/2018 04:38 PM   Modules accepted: Orders

## 2018-02-07 DIAGNOSIS — T8454XA Infection and inflammatory reaction due to internal left knee prosthesis, initial encounter: Secondary | ICD-10-CM | POA: Diagnosis not present

## 2018-02-07 DIAGNOSIS — E1151 Type 2 diabetes mellitus with diabetic peripheral angiopathy without gangrene: Secondary | ICD-10-CM | POA: Diagnosis not present

## 2018-02-07 DIAGNOSIS — I5042 Chronic combined systolic (congestive) and diastolic (congestive) heart failure: Secondary | ICD-10-CM | POA: Diagnosis not present

## 2018-02-07 DIAGNOSIS — I110 Hypertensive heart disease with heart failure: Secondary | ICD-10-CM | POA: Diagnosis not present

## 2018-02-07 DIAGNOSIS — J449 Chronic obstructive pulmonary disease, unspecified: Secondary | ICD-10-CM | POA: Diagnosis not present

## 2018-02-07 DIAGNOSIS — M009 Pyogenic arthritis, unspecified: Secondary | ICD-10-CM | POA: Diagnosis not present

## 2018-02-10 DIAGNOSIS — E1151 Type 2 diabetes mellitus with diabetic peripheral angiopathy without gangrene: Secondary | ICD-10-CM | POA: Diagnosis not present

## 2018-02-10 DIAGNOSIS — T8454XA Infection and inflammatory reaction due to internal left knee prosthesis, initial encounter: Secondary | ICD-10-CM | POA: Diagnosis not present

## 2018-02-10 DIAGNOSIS — I5042 Chronic combined systolic (congestive) and diastolic (congestive) heart failure: Secondary | ICD-10-CM | POA: Diagnosis not present

## 2018-02-10 DIAGNOSIS — J449 Chronic obstructive pulmonary disease, unspecified: Secondary | ICD-10-CM | POA: Diagnosis not present

## 2018-02-10 DIAGNOSIS — M009 Pyogenic arthritis, unspecified: Secondary | ICD-10-CM | POA: Diagnosis not present

## 2018-02-10 DIAGNOSIS — I110 Hypertensive heart disease with heart failure: Secondary | ICD-10-CM | POA: Diagnosis not present

## 2018-02-11 DIAGNOSIS — I110 Hypertensive heart disease with heart failure: Secondary | ICD-10-CM | POA: Diagnosis not present

## 2018-02-11 DIAGNOSIS — J449 Chronic obstructive pulmonary disease, unspecified: Secondary | ICD-10-CM | POA: Diagnosis not present

## 2018-02-11 DIAGNOSIS — I5042 Chronic combined systolic (congestive) and diastolic (congestive) heart failure: Secondary | ICD-10-CM | POA: Diagnosis not present

## 2018-02-11 DIAGNOSIS — M009 Pyogenic arthritis, unspecified: Secondary | ICD-10-CM | POA: Diagnosis not present

## 2018-02-11 DIAGNOSIS — T8454XA Infection and inflammatory reaction due to internal left knee prosthesis, initial encounter: Secondary | ICD-10-CM | POA: Diagnosis not present

## 2018-02-11 DIAGNOSIS — E1151 Type 2 diabetes mellitus with diabetic peripheral angiopathy without gangrene: Secondary | ICD-10-CM | POA: Diagnosis not present

## 2018-02-13 DIAGNOSIS — E1151 Type 2 diabetes mellitus with diabetic peripheral angiopathy without gangrene: Secondary | ICD-10-CM | POA: Diagnosis not present

## 2018-02-13 DIAGNOSIS — J449 Chronic obstructive pulmonary disease, unspecified: Secondary | ICD-10-CM | POA: Diagnosis not present

## 2018-02-13 DIAGNOSIS — I5042 Chronic combined systolic (congestive) and diastolic (congestive) heart failure: Secondary | ICD-10-CM | POA: Diagnosis not present

## 2018-02-13 DIAGNOSIS — M009 Pyogenic arthritis, unspecified: Secondary | ICD-10-CM | POA: Diagnosis not present

## 2018-02-13 DIAGNOSIS — I110 Hypertensive heart disease with heart failure: Secondary | ICD-10-CM | POA: Diagnosis not present

## 2018-02-13 DIAGNOSIS — T8454XA Infection and inflammatory reaction due to internal left knee prosthesis, initial encounter: Secondary | ICD-10-CM | POA: Diagnosis not present

## 2018-02-14 DIAGNOSIS — J449 Chronic obstructive pulmonary disease, unspecified: Secondary | ICD-10-CM | POA: Diagnosis not present

## 2018-02-14 DIAGNOSIS — M009 Pyogenic arthritis, unspecified: Secondary | ICD-10-CM | POA: Diagnosis not present

## 2018-02-14 DIAGNOSIS — I5042 Chronic combined systolic (congestive) and diastolic (congestive) heart failure: Secondary | ICD-10-CM | POA: Diagnosis not present

## 2018-02-14 DIAGNOSIS — T8454XA Infection and inflammatory reaction due to internal left knee prosthesis, initial encounter: Secondary | ICD-10-CM | POA: Diagnosis not present

## 2018-02-14 DIAGNOSIS — I110 Hypertensive heart disease with heart failure: Secondary | ICD-10-CM | POA: Diagnosis not present

## 2018-02-14 DIAGNOSIS — E1151 Type 2 diabetes mellitus with diabetic peripheral angiopathy without gangrene: Secondary | ICD-10-CM | POA: Diagnosis not present

## 2018-02-16 DIAGNOSIS — E1122 Type 2 diabetes mellitus with diabetic chronic kidney disease: Secondary | ICD-10-CM | POA: Diagnosis not present

## 2018-02-16 DIAGNOSIS — C3490 Malignant neoplasm of unspecified part of unspecified bronchus or lung: Secondary | ICD-10-CM | POA: Diagnosis not present

## 2018-02-16 DIAGNOSIS — Z7951 Long term (current) use of inhaled steroids: Secondary | ICD-10-CM | POA: Diagnosis not present

## 2018-02-16 DIAGNOSIS — J9611 Chronic respiratory failure with hypoxia: Secondary | ICD-10-CM | POA: Diagnosis not present

## 2018-02-16 DIAGNOSIS — Z87891 Personal history of nicotine dependence: Secondary | ICD-10-CM | POA: Diagnosis not present

## 2018-02-16 DIAGNOSIS — Z7982 Long term (current) use of aspirin: Secondary | ICD-10-CM | POA: Diagnosis not present

## 2018-02-16 DIAGNOSIS — I714 Abdominal aortic aneurysm, without rupture: Secondary | ICD-10-CM | POA: Diagnosis not present

## 2018-02-16 DIAGNOSIS — E1151 Type 2 diabetes mellitus with diabetic peripheral angiopathy without gangrene: Secondary | ICD-10-CM | POA: Diagnosis not present

## 2018-02-16 DIAGNOSIS — E7800 Pure hypercholesterolemia, unspecified: Secondary | ICD-10-CM | POA: Diagnosis not present

## 2018-02-16 DIAGNOSIS — Z8546 Personal history of malignant neoplasm of prostate: Secondary | ICD-10-CM | POA: Diagnosis not present

## 2018-02-16 DIAGNOSIS — N182 Chronic kidney disease, stage 2 (mild): Secondary | ICD-10-CM | POA: Diagnosis not present

## 2018-02-16 DIAGNOSIS — I6523 Occlusion and stenosis of bilateral carotid arteries: Secondary | ICD-10-CM | POA: Diagnosis not present

## 2018-02-16 DIAGNOSIS — J449 Chronic obstructive pulmonary disease, unspecified: Secondary | ICD-10-CM | POA: Diagnosis not present

## 2018-02-16 DIAGNOSIS — I130 Hypertensive heart and chronic kidney disease with heart failure and stage 1 through stage 4 chronic kidney disease, or unspecified chronic kidney disease: Secondary | ICD-10-CM | POA: Diagnosis not present

## 2018-02-16 DIAGNOSIS — Z9981 Dependence on supplemental oxygen: Secondary | ICD-10-CM | POA: Diagnosis not present

## 2018-02-16 DIAGNOSIS — K219 Gastro-esophageal reflux disease without esophagitis: Secondary | ICD-10-CM | POA: Diagnosis not present

## 2018-02-16 DIAGNOSIS — I5042 Chronic combined systolic (congestive) and diastolic (congestive) heart failure: Secondary | ICD-10-CM | POA: Diagnosis not present

## 2018-02-16 DIAGNOSIS — M109 Gout, unspecified: Secondary | ICD-10-CM | POA: Diagnosis not present

## 2018-02-16 DIAGNOSIS — Z9181 History of falling: Secondary | ICD-10-CM | POA: Diagnosis not present

## 2018-02-17 DIAGNOSIS — N182 Chronic kidney disease, stage 2 (mild): Secondary | ICD-10-CM | POA: Diagnosis not present

## 2018-02-17 DIAGNOSIS — J9611 Chronic respiratory failure with hypoxia: Secondary | ICD-10-CM | POA: Diagnosis not present

## 2018-02-17 DIAGNOSIS — J449 Chronic obstructive pulmonary disease, unspecified: Secondary | ICD-10-CM | POA: Diagnosis not present

## 2018-02-17 DIAGNOSIS — E1122 Type 2 diabetes mellitus with diabetic chronic kidney disease: Secondary | ICD-10-CM | POA: Diagnosis not present

## 2018-02-17 DIAGNOSIS — I5042 Chronic combined systolic (congestive) and diastolic (congestive) heart failure: Secondary | ICD-10-CM | POA: Diagnosis not present

## 2018-02-17 DIAGNOSIS — I130 Hypertensive heart and chronic kidney disease with heart failure and stage 1 through stage 4 chronic kidney disease, or unspecified chronic kidney disease: Secondary | ICD-10-CM | POA: Diagnosis not present

## 2018-02-17 DIAGNOSIS — T8454XA Infection and inflammatory reaction due to internal left knee prosthesis, initial encounter: Secondary | ICD-10-CM | POA: Diagnosis not present

## 2018-02-19 DIAGNOSIS — I130 Hypertensive heart and chronic kidney disease with heart failure and stage 1 through stage 4 chronic kidney disease, or unspecified chronic kidney disease: Secondary | ICD-10-CM | POA: Diagnosis not present

## 2018-02-19 DIAGNOSIS — I5042 Chronic combined systolic (congestive) and diastolic (congestive) heart failure: Secondary | ICD-10-CM | POA: Diagnosis not present

## 2018-02-19 DIAGNOSIS — J9611 Chronic respiratory failure with hypoxia: Secondary | ICD-10-CM | POA: Diagnosis not present

## 2018-02-19 DIAGNOSIS — N182 Chronic kidney disease, stage 2 (mild): Secondary | ICD-10-CM | POA: Diagnosis not present

## 2018-02-19 DIAGNOSIS — J449 Chronic obstructive pulmonary disease, unspecified: Secondary | ICD-10-CM | POA: Diagnosis not present

## 2018-02-19 DIAGNOSIS — E1122 Type 2 diabetes mellitus with diabetic chronic kidney disease: Secondary | ICD-10-CM | POA: Diagnosis not present

## 2018-02-21 DIAGNOSIS — J449 Chronic obstructive pulmonary disease, unspecified: Secondary | ICD-10-CM | POA: Diagnosis not present

## 2018-02-21 DIAGNOSIS — N182 Chronic kidney disease, stage 2 (mild): Secondary | ICD-10-CM | POA: Diagnosis not present

## 2018-02-21 DIAGNOSIS — I5042 Chronic combined systolic (congestive) and diastolic (congestive) heart failure: Secondary | ICD-10-CM | POA: Diagnosis not present

## 2018-02-21 DIAGNOSIS — I130 Hypertensive heart and chronic kidney disease with heart failure and stage 1 through stage 4 chronic kidney disease, or unspecified chronic kidney disease: Secondary | ICD-10-CM | POA: Diagnosis not present

## 2018-02-21 DIAGNOSIS — E1122 Type 2 diabetes mellitus with diabetic chronic kidney disease: Secondary | ICD-10-CM | POA: Diagnosis not present

## 2018-02-21 DIAGNOSIS — J9611 Chronic respiratory failure with hypoxia: Secondary | ICD-10-CM | POA: Diagnosis not present

## 2018-02-24 DIAGNOSIS — N182 Chronic kidney disease, stage 2 (mild): Secondary | ICD-10-CM | POA: Diagnosis not present

## 2018-02-24 DIAGNOSIS — I130 Hypertensive heart and chronic kidney disease with heart failure and stage 1 through stage 4 chronic kidney disease, or unspecified chronic kidney disease: Secondary | ICD-10-CM | POA: Diagnosis not present

## 2018-02-24 DIAGNOSIS — J449 Chronic obstructive pulmonary disease, unspecified: Secondary | ICD-10-CM | POA: Diagnosis not present

## 2018-02-24 DIAGNOSIS — J9611 Chronic respiratory failure with hypoxia: Secondary | ICD-10-CM | POA: Diagnosis not present

## 2018-02-24 DIAGNOSIS — I5042 Chronic combined systolic (congestive) and diastolic (congestive) heart failure: Secondary | ICD-10-CM | POA: Diagnosis not present

## 2018-02-24 DIAGNOSIS — E1122 Type 2 diabetes mellitus with diabetic chronic kidney disease: Secondary | ICD-10-CM | POA: Diagnosis not present

## 2018-02-25 ENCOUNTER — Ambulatory Visit: Payer: Medicare Other | Admitting: Internal Medicine

## 2018-02-26 DIAGNOSIS — E1122 Type 2 diabetes mellitus with diabetic chronic kidney disease: Secondary | ICD-10-CM | POA: Diagnosis not present

## 2018-02-26 DIAGNOSIS — J449 Chronic obstructive pulmonary disease, unspecified: Secondary | ICD-10-CM | POA: Diagnosis not present

## 2018-02-26 DIAGNOSIS — N182 Chronic kidney disease, stage 2 (mild): Secondary | ICD-10-CM | POA: Diagnosis not present

## 2018-02-26 DIAGNOSIS — J9611 Chronic respiratory failure with hypoxia: Secondary | ICD-10-CM | POA: Diagnosis not present

## 2018-02-26 DIAGNOSIS — I130 Hypertensive heart and chronic kidney disease with heart failure and stage 1 through stage 4 chronic kidney disease, or unspecified chronic kidney disease: Secondary | ICD-10-CM | POA: Diagnosis not present

## 2018-02-26 DIAGNOSIS — I5042 Chronic combined systolic (congestive) and diastolic (congestive) heart failure: Secondary | ICD-10-CM | POA: Diagnosis not present

## 2018-02-27 DIAGNOSIS — J449 Chronic obstructive pulmonary disease, unspecified: Secondary | ICD-10-CM | POA: Diagnosis not present

## 2018-02-27 DIAGNOSIS — J441 Chronic obstructive pulmonary disease with (acute) exacerbation: Secondary | ICD-10-CM | POA: Diagnosis not present

## 2018-02-27 DIAGNOSIS — I5042 Chronic combined systolic (congestive) and diastolic (congestive) heart failure: Secondary | ICD-10-CM | POA: Diagnosis not present

## 2018-02-27 DIAGNOSIS — I509 Heart failure, unspecified: Secondary | ICD-10-CM | POA: Diagnosis not present

## 2018-02-27 DIAGNOSIS — G8929 Other chronic pain: Secondary | ICD-10-CM | POA: Diagnosis not present

## 2018-02-27 DIAGNOSIS — C3411 Malignant neoplasm of upper lobe, right bronchus or lung: Secondary | ICD-10-CM | POA: Diagnosis not present

## 2018-02-27 DIAGNOSIS — I130 Hypertensive heart and chronic kidney disease with heart failure and stage 1 through stage 4 chronic kidney disease, or unspecified chronic kidney disease: Secondary | ICD-10-CM | POA: Diagnosis not present

## 2018-02-27 DIAGNOSIS — J9611 Chronic respiratory failure with hypoxia: Secondary | ICD-10-CM | POA: Diagnosis not present

## 2018-02-27 DIAGNOSIS — E1122 Type 2 diabetes mellitus with diabetic chronic kidney disease: Secondary | ICD-10-CM | POA: Diagnosis not present

## 2018-02-27 DIAGNOSIS — N189 Chronic kidney disease, unspecified: Secondary | ICD-10-CM | POA: Diagnosis not present

## 2018-02-27 DIAGNOSIS — R627 Adult failure to thrive: Secondary | ICD-10-CM | POA: Diagnosis not present

## 2018-02-27 DIAGNOSIS — N182 Chronic kidney disease, stage 2 (mild): Secondary | ICD-10-CM | POA: Diagnosis not present

## 2018-03-03 DIAGNOSIS — J9611 Chronic respiratory failure with hypoxia: Secondary | ICD-10-CM | POA: Diagnosis not present

## 2018-03-03 DIAGNOSIS — E1122 Type 2 diabetes mellitus with diabetic chronic kidney disease: Secondary | ICD-10-CM | POA: Diagnosis not present

## 2018-03-03 DIAGNOSIS — N182 Chronic kidney disease, stage 2 (mild): Secondary | ICD-10-CM | POA: Diagnosis not present

## 2018-03-03 DIAGNOSIS — I130 Hypertensive heart and chronic kidney disease with heart failure and stage 1 through stage 4 chronic kidney disease, or unspecified chronic kidney disease: Secondary | ICD-10-CM | POA: Diagnosis not present

## 2018-03-03 DIAGNOSIS — I5042 Chronic combined systolic (congestive) and diastolic (congestive) heart failure: Secondary | ICD-10-CM | POA: Diagnosis not present

## 2018-03-03 DIAGNOSIS — J449 Chronic obstructive pulmonary disease, unspecified: Secondary | ICD-10-CM | POA: Diagnosis not present

## 2018-03-06 ENCOUNTER — Observation Stay (HOSPITAL_BASED_OUTPATIENT_CLINIC_OR_DEPARTMENT_OTHER): Payer: Medicare Other

## 2018-03-06 ENCOUNTER — Observation Stay (HOSPITAL_COMMUNITY)
Admission: EM | Admit: 2018-03-06 | Discharge: 2018-03-07 | Disposition: A | Discharge status: Home / Self Care | Payer: Medicare Other | Attending: Internal Medicine | Admitting: Internal Medicine

## 2018-03-06 ENCOUNTER — Encounter (HOSPITAL_COMMUNITY): Payer: Self-pay

## 2018-03-06 ENCOUNTER — Ambulatory Visit (INDEPENDENT_AMBULATORY_CARE_PROVIDER_SITE_OTHER): Payer: Medicare Other | Admitting: Internal Medicine

## 2018-03-06 ENCOUNTER — Emergency Department (HOSPITAL_COMMUNITY): Payer: Medicare Other

## 2018-03-06 ENCOUNTER — Encounter: Payer: Self-pay | Admitting: Internal Medicine

## 2018-03-06 ENCOUNTER — Other Ambulatory Visit: Payer: Self-pay

## 2018-03-06 DIAGNOSIS — Z8546 Personal history of malignant neoplasm of prostate: Secondary | ICD-10-CM | POA: Insufficient documentation

## 2018-03-06 DIAGNOSIS — T8459XA Infection and inflammatory reaction due to other internal joint prosthesis, initial encounter: Secondary | ICD-10-CM

## 2018-03-06 DIAGNOSIS — R9431 Abnormal electrocardiogram [ECG] [EKG]: Secondary | ICD-10-CM | POA: Insufficient documentation

## 2018-03-06 DIAGNOSIS — R609 Edema, unspecified: Secondary | ICD-10-CM

## 2018-03-06 DIAGNOSIS — K219 Gastro-esophageal reflux disease without esophagitis: Secondary | ICD-10-CM | POA: Diagnosis not present

## 2018-03-06 DIAGNOSIS — Z96652 Presence of left artificial knee joint: Secondary | ICD-10-CM | POA: Insufficient documentation

## 2018-03-06 DIAGNOSIS — Z96659 Presence of unspecified artificial knee joint: Secondary | ICD-10-CM

## 2018-03-06 DIAGNOSIS — T8454XD Infection and inflammatory reaction due to internal left knee prosthesis, subsequent encounter: Secondary | ICD-10-CM | POA: Diagnosis not present

## 2018-03-06 DIAGNOSIS — R0602 Shortness of breath: Secondary | ICD-10-CM | POA: Diagnosis not present

## 2018-03-06 DIAGNOSIS — E119 Type 2 diabetes mellitus without complications: Secondary | ICD-10-CM | POA: Diagnosis not present

## 2018-03-06 DIAGNOSIS — Z888 Allergy status to other drugs, medicaments and biological substances status: Secondary | ICD-10-CM | POA: Diagnosis not present

## 2018-03-06 DIAGNOSIS — Z993 Dependence on wheelchair: Secondary | ICD-10-CM | POA: Diagnosis not present

## 2018-03-06 DIAGNOSIS — E876 Hypokalemia: Secondary | ICD-10-CM | POA: Diagnosis not present

## 2018-03-06 DIAGNOSIS — R2689 Other abnormalities of gait and mobility: Secondary | ICD-10-CM | POA: Insufficient documentation

## 2018-03-06 DIAGNOSIS — Z87891 Personal history of nicotine dependence: Secondary | ICD-10-CM | POA: Diagnosis not present

## 2018-03-06 DIAGNOSIS — Z792 Long term (current) use of antibiotics: Secondary | ICD-10-CM

## 2018-03-06 DIAGNOSIS — C3411 Malignant neoplasm of upper lobe, right bronchus or lung: Secondary | ICD-10-CM | POA: Diagnosis not present

## 2018-03-06 DIAGNOSIS — J439 Emphysema, unspecified: Secondary | ICD-10-CM | POA: Diagnosis not present

## 2018-03-06 DIAGNOSIS — M5489 Other dorsalgia: Secondary | ICD-10-CM | POA: Diagnosis not present

## 2018-03-06 DIAGNOSIS — Z7982 Long term (current) use of aspirin: Secondary | ICD-10-CM | POA: Insufficient documentation

## 2018-03-06 DIAGNOSIS — J449 Chronic obstructive pulmonary disease, unspecified: Secondary | ICD-10-CM | POA: Diagnosis not present

## 2018-03-06 DIAGNOSIS — I4510 Unspecified right bundle-branch block: Secondary | ICD-10-CM | POA: Diagnosis not present

## 2018-03-06 DIAGNOSIS — J690 Pneumonitis due to inhalation of food and vomit: Secondary | ICD-10-CM | POA: Insufficient documentation

## 2018-03-06 DIAGNOSIS — Z9049 Acquired absence of other specified parts of digestive tract: Secondary | ICD-10-CM | POA: Diagnosis not present

## 2018-03-06 DIAGNOSIS — R5381 Other malaise: Secondary | ICD-10-CM | POA: Insufficient documentation

## 2018-03-06 DIAGNOSIS — Z801 Family history of malignant neoplasm of trachea, bronchus and lung: Secondary | ICD-10-CM | POA: Insufficient documentation

## 2018-03-06 DIAGNOSIS — Z79899 Other long term (current) drug therapy: Secondary | ICD-10-CM | POA: Diagnosis not present

## 2018-03-06 DIAGNOSIS — D638 Anemia in other chronic diseases classified elsewhere: Secondary | ICD-10-CM | POA: Diagnosis not present

## 2018-03-06 DIAGNOSIS — R55 Syncope and collapse: Secondary | ICD-10-CM | POA: Diagnosis not present

## 2018-03-06 DIAGNOSIS — R079 Chest pain, unspecified: Secondary | ICD-10-CM | POA: Diagnosis not present

## 2018-03-06 DIAGNOSIS — R4020 Unspecified coma: Secondary | ICD-10-CM | POA: Diagnosis not present

## 2018-03-06 DIAGNOSIS — M6281 Muscle weakness (generalized): Secondary | ICD-10-CM | POA: Insufficient documentation

## 2018-03-06 DIAGNOSIS — I2699 Other pulmonary embolism without acute cor pulmonale: Secondary | ICD-10-CM

## 2018-03-06 DIAGNOSIS — E785 Hyperlipidemia, unspecified: Secondary | ICD-10-CM | POA: Diagnosis not present

## 2018-03-06 DIAGNOSIS — M109 Gout, unspecified: Secondary | ICD-10-CM | POA: Insufficient documentation

## 2018-03-06 DIAGNOSIS — Z79891 Long term (current) use of opiate analgesic: Secondary | ICD-10-CM | POA: Insufficient documentation

## 2018-03-06 DIAGNOSIS — Z8042 Family history of malignant neoplasm of prostate: Secondary | ICD-10-CM | POA: Insufficient documentation

## 2018-03-06 DIAGNOSIS — I5042 Chronic combined systolic (congestive) and diastolic (congestive) heart failure: Secondary | ICD-10-CM | POA: Diagnosis not present

## 2018-03-06 DIAGNOSIS — I110 Hypertensive heart disease with heart failure: Secondary | ICD-10-CM | POA: Insufficient documentation

## 2018-03-06 DIAGNOSIS — R918 Other nonspecific abnormal finding of lung field: Secondary | ICD-10-CM

## 2018-03-06 DIAGNOSIS — C3490 Malignant neoplasm of unspecified part of unspecified bronchus or lung: Secondary | ICD-10-CM | POA: Diagnosis present

## 2018-03-06 DIAGNOSIS — Z8679 Personal history of other diseases of the circulatory system: Secondary | ICD-10-CM | POA: Insufficient documentation

## 2018-03-06 DIAGNOSIS — Z8249 Family history of ischemic heart disease and other diseases of the circulatory system: Secondary | ICD-10-CM | POA: Diagnosis not present

## 2018-03-06 DIAGNOSIS — R42 Dizziness and giddiness: Secondary | ICD-10-CM | POA: Diagnosis not present

## 2018-03-06 DIAGNOSIS — Z791 Long term (current) use of non-steroidal anti-inflammatories (NSAID): Secondary | ICD-10-CM | POA: Insufficient documentation

## 2018-03-06 HISTORY — DX: Other pulmonary embolism without acute cor pulmonale: I26.99

## 2018-03-06 HISTORY — DX: Infection and inflammatory reaction due to other internal joint prosthesis, initial encounter: T84.59XA

## 2018-03-06 HISTORY — DX: Presence of unspecified artificial knee joint: Z96.659

## 2018-03-06 LAB — TROPONIN I: Troponin I: <0.03 ng/mL (ref <0.03)

## 2018-03-06 LAB — CBC
HCT: 33.5 % — ABNORMAL LOW (ref 39.0–52.0)
Hemoglobin: 9.9 g/dL — ABNORMAL LOW (ref 13.0–17.0)
MCH: 29.1 pg (ref 26.0–34.0)
MCHC: 29.6 g/dL — ABNORMAL LOW (ref 30.0–36.0)
MCV: 98.5 fL (ref 80.0–100.0)
Platelets: 215 10*3/uL (ref 150–400)
RBC: 3.4 MIL/uL — ABNORMAL LOW (ref 4.22–5.81)
RDW: 17.4 % — ABNORMAL HIGH (ref 11.5–15.5)
WBC: 5.3 10*3/uL (ref 4.0–10.5)
nRBC: 0 % (ref 0.0–0.2)

## 2018-03-06 LAB — BASIC METABOLIC PANEL
Anion gap: 11 (ref 5–15)
BUN: 8 mg/dL (ref 8–23)
CO2: 21 mmol/L — ABNORMAL LOW (ref 22–32)
CREATININE: 0.77 mg/dL (ref 0.61–1.24)
Calcium: 8.9 mg/dL (ref 8.9–10.3)
Chloride: 108 mmol/L (ref 98–111)
GFR calc Af Amer: >60 mL/min (ref >60)
GFR calc non Af Amer: >60 mL/min (ref >60)
Glucose, Bld: 138 mg/dL — ABNORMAL HIGH (ref 70–99)
Potassium: 3.6 mmol/L (ref 3.5–5.1)
SODIUM: 140 mmol/L (ref 135–145)

## 2018-03-06 LAB — URINALYSIS, ROUTINE W REFLEX MICROSCOPIC
Bilirubin Urine: NEGATIVE
Glucose, UA: NEGATIVE mg/dL
Hgb urine dipstick: NEGATIVE
Ketones, ur: NEGATIVE mg/dL
LEUKOCYTE UA: NEGATIVE
Nitrite: NEGATIVE
Protein, ur: NEGATIVE mg/dL
Specific Gravity, Urine: 1.015 (ref 1.005–1.030)
pH: 7 (ref 5.0–8.0)

## 2018-03-06 LAB — LACTIC ACID, PLASMA: Lactic Acid, Venous: 1.5 mmol/L (ref 0.5–1.9)

## 2018-03-06 LAB — I-STAT TROPONIN, ED: Troponin i, poc: 0.01 ng/mL (ref 0.00–0.08)

## 2018-03-06 MED ORDER — MOMETASONE FURO-FORMOTEROL FUM 200-5 MCG/ACT IN AERO
2.0000 | INHALATION_SPRAY | Freq: Two times a day (BID) | RESPIRATORY_TRACT | Status: DC
  Administered 2018-03-06 – 2018-03-07 (×2): 2 via RESPIRATORY_TRACT
  Filled 2018-03-06: qty 8.8

## 2018-03-06 MED ORDER — DOXYCYCLINE HYCLATE 100 MG PO TABS
100.0000 mg | ORAL_TABLET | Freq: Two times a day (BID) | ORAL | Status: DC
  Administered 2018-03-06 – 2018-03-07 (×2): 100 mg via ORAL
  Filled 2018-03-06 (×2): qty 1

## 2018-03-06 MED ORDER — HEPARIN (PORCINE) 25000 UT/250ML-% IV SOLN
1200.0000 [IU]/h | INTRAVENOUS | Status: AC
  Administered 2018-03-06: 1500 [IU]/h via INTRAVENOUS
  Administered 2018-03-07: 1200 [IU]/h via INTRAVENOUS
  Filled 2018-03-06 (×2): qty 250

## 2018-03-06 MED ORDER — GUAIFENESIN ER 600 MG PO TB12
600.0000 mg | ORAL_TABLET | Freq: Two times a day (BID) | ORAL | Status: DC
  Administered 2018-03-06 – 2018-03-07 (×2): 600 mg via ORAL
  Filled 2018-03-06 (×2): qty 1

## 2018-03-06 MED ORDER — DOXYCYCLINE HYCLATE 100 MG PO TABS: 100.0000 mg | ORAL_TABLET | Freq: Two times a day (BID) | ORAL | 4 refills | Status: DC

## 2018-03-06 MED ORDER — SODIUM CHLORIDE 0.9 % IV SOLN
3.0000 g | Freq: Four times a day (QID) | INTRAVENOUS | Status: DC
  Administered 2018-03-06 – 2018-03-07 (×4): 3 g via INTRAVENOUS
  Filled 2018-03-06 (×5): qty 3

## 2018-03-06 MED ORDER — PANTOPRAZOLE SODIUM 40 MG PO TBEC
40.0000 mg | DELAYED_RELEASE_TABLET | Freq: Every day | ORAL | Status: DC
  Administered 2018-03-07: 40 mg via ORAL
  Filled 2018-03-06: qty 1

## 2018-03-06 MED ORDER — CEPHALEXIN 250 MG PO CAPS
500.0000 mg | ORAL_CAPSULE | Freq: Four times a day (QID) | ORAL | Status: DC
  Administered 2018-03-06 – 2018-03-07 (×5): 500 mg via ORAL
  Filled 2018-03-06 (×5): qty 2

## 2018-03-06 MED ORDER — HEPARIN BOLUS VIA INFUSION
5500.0000 [IU] | Freq: Once | INTRAVENOUS | Status: AC
  Administered 2018-03-06: 5500 [IU] via INTRAVENOUS
  Filled 2018-03-06: qty 5500

## 2018-03-06 MED ORDER — IOPAMIDOL (ISOVUE-370) INJECTION 76%
INTRAVENOUS | Status: AC
  Administered 2018-03-06: 14:00:00
  Filled 2018-03-06: qty 100

## 2018-03-06 MED ORDER — OXYCODONE HCL 5 MG PO TABS
5.0000 mg | ORAL_TABLET | Freq: Three times a day (TID) | ORAL | Status: DC
  Administered 2018-03-06 – 2018-03-07 (×4): 5 mg via ORAL
  Filled 2018-03-06 (×4): qty 1

## 2018-03-06 MED ORDER — ASPIRIN EC 81 MG PO TBEC
81.0000 mg | DELAYED_RELEASE_TABLET | Freq: Every day | ORAL | Status: DC
  Administered 2018-03-06 – 2018-03-07 (×2): 81 mg via ORAL
  Filled 2018-03-06 (×2): qty 1

## 2018-03-06 MED ORDER — CARVEDILOL 3.125 MG PO TABS
3.1250 mg | ORAL_TABLET | Freq: Two times a day (BID) | ORAL | Status: DC
  Administered 2018-03-06 – 2018-03-07 (×2): 3.125 mg via ORAL
  Filled 2018-03-06 (×2): qty 1

## 2018-03-06 MED ORDER — ALBUTEROL SULFATE (2.5 MG/3ML) 0.083% IN NEBU: 2.5000 mg | INHALATION_SOLUTION | RESPIRATORY_TRACT | Status: DC | PRN

## 2018-03-06 MED ORDER — SODIUM CHLORIDE 0.9 % IV SOLN
INTRAVENOUS | Status: AC
  Administered 2018-03-06: 17:00:00 via INTRAVENOUS

## 2018-03-06 MED ORDER — ADULT MULTIVITAMIN W/MINERALS CH
1.0000 | ORAL_TABLET | Freq: Every day | ORAL | Status: DC
  Administered 2018-03-07: 1 via ORAL
  Filled 2018-03-06: qty 1

## 2018-03-06 MED ORDER — ALLOPURINOL 300 MG PO TABS
300.0000 mg | ORAL_TABLET | Freq: Every day | ORAL | Status: DC
  Administered 2018-03-06 – 2018-03-07 (×2): 300 mg via ORAL
  Filled 2018-03-06 (×2): qty 1

## 2018-03-06 MED ORDER — AMITRIPTYLINE HCL 25 MG PO TABS: 50.0000 mg | ORAL_TABLET | Freq: Every evening | ORAL | Status: DC | PRN

## 2018-03-06 MED ORDER — UMECLIDINIUM BROMIDE 62.5 MCG/INH IN AEPB
1.0000 | INHALATION_SPRAY | Freq: Every day | RESPIRATORY_TRACT | Status: DC
  Filled 2018-03-06: qty 7

## 2018-03-06 MED ORDER — SODIUM CHLORIDE 0.9% FLUSH
10.0000 mL | Freq: Two times a day (BID) | INTRAVENOUS | Status: DC
  Administered 2018-03-07: 10 mL

## 2018-03-06 MED ORDER — ACETAMINOPHEN 325 MG PO TABS
650.0000 mg | ORAL_TABLET | Freq: Four times a day (QID) | ORAL | Status: DC | PRN
  Administered 2018-03-06: 650 mg via ORAL
  Filled 2018-03-06: qty 2

## 2018-03-06 MED ORDER — SACUBITRIL-VALSARTAN 49-51 MG PO TABS
1.0000 | ORAL_TABLET | Freq: Two times a day (BID) | ORAL | Status: DC
  Administered 2018-03-06 – 2018-03-07 (×2): 1 via ORAL
  Filled 2018-03-06 (×3): qty 1

## 2018-03-06 MED ORDER — TIOTROPIUM BROMIDE MONOHYDRATE 18 MCG IN CAPS: 18.0000 ug | ORAL_CAPSULE | Freq: Every day | RESPIRATORY_TRACT | Status: DC

## 2018-03-06 MED ORDER — SODIUM CHLORIDE 0.9 % IV BOLUS
500.0000 mL | Freq: Once | INTRAVENOUS | Status: AC
  Administered 2018-03-06: 500 mL via INTRAVENOUS

## 2018-03-06 MED ORDER — ACETAMINOPHEN 650 MG RE SUPP: 650.0000 mg | Freq: Four times a day (QID) | RECTAL | Status: DC | PRN

## 2018-03-06 MED ORDER — SODIUM CHLORIDE 0.9% FLUSH
10.0000 mL | INTRAVENOUS | Status: DC | PRN
  Administered 2018-03-06: 10 mL
  Filled 2018-03-06: qty 40

## 2018-03-06 MED ORDER — CEPHALEXIN 500 MG PO CAPS: 500.0000 mg | ORAL_CAPSULE | Freq: Four times a day (QID) | ORAL | 4 refills | Status: DC

## 2018-03-06 NOTE — Progress Notes (Signed)
[facility] for Infectious Disease      Reason for Consult: Prosthetic Joint Infection    Referring Physician: Dr. Hinton Rao    Patient ID: John Maddox, male    DOB: 1940/04/23, 78 y.o.   MRN: [id number]  HPI:   Here for above. He has a history of a prosthetic joint in left side and in December 2019 presented to [facility] with fever and knee pain and noted a PJI.  Sent to [facility] and underwent debridement with retention of hardware.  Had received previous antibiotics and culture remained negative.  He completed 6 weeks of IV daptomycin, ceftriaxone and rifampin via his port and is here two weeks after treatment completion.  He is still taking his rifampin though.  Knee feeling well.  He has had significant diarrhea but improved some. He has had about a 50 lb weight loss.    History of lung cancer and has port in place.  Waiting for infection to clear prior to restarting immunomodulators.  Previous record reviewed from [facility] and summarized above.   Past Medical History:  Diagnosis Date  . Arrhythmia   . Asthma 09/23/2017  . Congestive heart failure ([place]) 09/23/2017  . COPD (chronic obstructive pulmonary disease) ([place]) 09/23/2017  . Diabetes mellitus ([place]) 09/23/2017  . Diastolic dysfunction 04/21/4257  . Esophageal reflux 09/23/2017  . Fatigue 09/23/2017  . Gout 09/23/2017  . History of prostate cancer 09/23/2017  . Hyperlipidemia 09/23/2017  . Hypertension 09/23/2017  . PVC's (premature ventricular contractions) 09/23/2017    Prior to Admission medications   Medication Sig Start Date End Date Taking? Authorizing Provider  allopurinol (ZYLOPRIM) 300 MG tablet Take 300 mg by mouth daily.   Yes [provider]  amitriptyline (ELAVIL) 50 MG tablet Take 50 mg by mouth daily. 10/21/17  Yes [provider]  aspirin EC 81 MG tablet Take 81 mg by mouth daily.   Yes [provider]  baclofen (LIORESAL) 10 MG tablet TAKE 1 TABLET BY MOUTH THREE TIMES DAILY AS  NEEDED FOR HICCUPS 11/15/17  Yes [provider]  Calcium Carbonate (CALCIUM-CARB 600 PO) Take 1 tablet by mouth 2 (two) times daily.    Yes [provider]  celecoxib (CELEBREX) 200 MG capsule Take 200 mg by mouth 2 (two) times daily.   Yes [provider]  fluticasone-salmeterol (ADVAIR HFA) 115-21 MCG/ACT inhaler Inhale 2 puffs into the lungs 2 (two) times daily.  05/22/17  Yes [provider]  Multiple Vitamin (MULTI-VITAMINS) TABS Take 1 tablet by mouth daily.   Yes [provider]  ondansetron (ZOFRAN) 4 MG tablet TAKE 1 TABLET BY MOUTH EVERY 4 HOURS AS NEEDED FOR NAUSEA 11/11/17  Yes [provider]  oxyCODONE (OXY IR/ROXICODONE) 5 MG immediate release tablet TAKE 1 TABLET BY MOUTH TWICE DAILY AS NEEDED FOR 21 DAYS 09/26/17  Yes [provider]  pantoprazole (PROTONIX) 20 MG tablet Take 20 mg by mouth daily. 09/23/17  Yes [provider]  PROAIR HFA 108 (90 Base) MCG/ACT inhaler Inhale 1 puff into the lungs every 6 (six) hours as needed.  08/09/17  Yes [provider]  prochlorperazine (COMPAZINE) 10 MG tablet TAKE 1 TABLET (10 MG) BY MOUTH EVERY 6 HOURS AS NEEDED FOR NAUSEA 11/11/17  Yes [provider]  sacubitril-valsartan (ENTRESTO) 49-51 MG Take 1 tablet by mouth 2 (two) times daily. 02/06/18  Yes Richardo Priest, MD  Sennosides-Docusate Sodium (SENOKOT S PO) Take 2 tablets by mouth daily.   Yes  [provider]  simvastatin (ZOCOR) 40 MG tablet Take 40 mg by mouth daily. 07/24/17  Yes [provider]  tiotropium (SPIRIVA HANDIHALER) 18 MCG inhalation capsule INHALE THE CONTENTS OF 1 CAPSULE DAILY 09/19/17  Yes [provider]  torsemide (DEMADEX) 10 MG tablet Take 10 mg by mouth daily.   Yes [provider]  carvedilol (COREG) 3.125 MG tablet Take 3.125 mg by mouth 2 (two) times daily. 02/04/18   [provider]  cephALEXin (KEFLEX) 500 MG capsule Take 1 capsule (500  mg total) by mouth 4 (four) times daily. Needs 4.5 months total 03/06/18   Mataya Kilduff, Okey Regal, MD  dexamethasone (DECADRON) 4 MG tablet TAKE 3 TABLETS BY MOUTH THE NIGHT BEFORE AND THE MORNING OF CHEMOTHERAPY WITH [facility] CYCLE 11/10/17   [provider]  doxycycline (VIBRA-TABS) 100 MG tablet Take 1 tablet (100 mg total) by mouth 2 (two) times daily. 03/06/18   Thayer Headings, MD  metFORMIN (GLUCOPHAGE-XR) 500 MG 24 hr tablet Take 500 mg by mouth daily. 11/12/17   [provider]    Allergies  Allergen Reactions  . Lantus [Insulin Glargine]     Stomach pain  . Quinolones Other (See Comments)    History of aortic aneurysm status post repair.  Use with caution    Social History   Tobacco Use  . Smoking status: Former Smoker    Packs/day: 2.50    Years: 40.00    Pack years: 100.00    Types: Cigarettes    Last attempt to quit: 2014    Years since quitting: 6.1  . Smokeless tobacco: Never Used  Substance Use Topics  . Alcohol use: Not Currently  . Drug use: Not Currently    Family History  Problem Relation Age of Onset  . Arthritis Mother   . Lung cancer Mother   . Hypertension Mother   . Arthritis Father   . Heart attack Father   . Prostate cancer Father   . Hypertension Father   . Hyperlipidemia Father   . Heart disease Father   . Alcohol abuse Father   . Arthritis Brother   . Lung cancer Brother      Review of Systems  Constitutional: negative for fevers, chills and malaise Gastrointestinal: positive for diarrhea, negative for nausea Integument/breast: negative for rash All other systems reviewed and are negative    Constitutional: in no apparent distress  Vitals:   03/06/18 1058  BP: (!) 93/58  Pulse: 99  Temp: 97.7 F (36.5 C)   EYES: anicteric ENMT:no thrush Cardiovascular: Cor RRR Respiratory: CTA B; normal respiratory effort GI: soft Musculoskeletal: left knee with well-healed scar, no erythema, no edema Skin: no rash Neuro:  non-focal  Labs: No results found for: WBC, HGB, HCT, MCV, PLT No results found for: CREATININE, BUN, NA, K, CL, CO2 No results found for: ALT, AST, GGT, ALKPHOS, BILITOT, INR   Assessment: 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  I agree with assessment of avoiding fluoroquinolone and will use doxycycline and keflex for 4.5 months.  Plan: 1) Keflex and doxycycline 2) rtc 3 months - if patient unable to return to see me, I would just check ESR in about 2 months and if ok (less than 50 or so), would stop after 4.5 months.   Patient was to get labs but prior to getting labs, the patient experienced a witnessed collapse, pulseless and required resuscitation with 4 rounds of CPR.  Patient responded and vitals returned to  normal.  EMS called and transported patient to the ED.

## 2018-03-06 NOTE — Progress Notes (Signed)
Pharmacy Antibiotic Note  John Maddox is a 78 y.o. male admitted on 03/06/2018 with pneumonia.  Pharmacy has been consulted for unasyn dosing. Pt is afebrile and WBC is WNL. Scr is WNL.   Plan: Unasyn 3gm IV Q6H F/u renal fxn, C&S, clinical status   Height: 5\' 10"  (177.8 cm) Weight: 228 lb 2.8 oz (103.5 kg) IBW/kg (Calculated) : 73  Temp (24hrs), Avg:97.7 F (36.5 C), Min:97.7 F (36.5 C), Max:97.7 F (36.5 C)  Recent Labs  Lab 03/06/18 1242  WBC 5.3  CREATININE 0.77  LATICACIDVEN 1.5    Estimated Creatinine Clearance: 94.7 mL/min (by C-G formula based on SCr of 0.77 mg/dL).    Allergies  Allergen Reactions  . Lantus [Insulin Glargine]     Stomach pain  . Quinolones Other (See Comments)    History of aortic aneurysm status post repair.  Use with caution    Antimicrobials this admission: Unasyn 2/20>>  Dose adjustments this admission: N/A  Microbiology results: Pending  Thank you for allowing pharmacy to be a part of this patient's care.  Jahid Weida, Rande Lawman 03/06/2018 3:15 PM

## 2018-03-06 NOTE — ED Notes (Signed)
Pt using urinal in bed.

## 2018-03-06 NOTE — Progress Notes (Signed)
Admission RN states she will see pt.

## 2018-03-06 NOTE — ED Provider Notes (Signed)
[facility] Emergency Department Provider Note MRN:  [id number]  Arrival date & time: 03/06/18     Chief Complaint   post cpr   History of Present Illness   John Maddox is a 78 y.o. year-old male with a history of COPD, CHF, presenting to the ED with chief complaint of unresponsiveness.  Patient was waiting in the waiting room to obtain labs that his PCPs office, was found to be unresponsive, was brought to the floor, healthcare providers there were unable to feel a carotid pulse, CPR was started.  Estimated 3 to 5 minutes of CPR until EMS arrived, during which patient was found to have a pulse, became responsive, quick return to baseline, currently alert and oriented.  Patient complaining of his chronic knee pain related to a chronic infection but otherwise with no complaints.  No recent fever, no cough, no chest pain, shortness of breath, no abdominal pain.  Has been taking oxycodone for pain.  Has had no prior issues with fainting spells.  Review of Systems  A complete 10 system review of systems was obtained and all systems are negative except as noted in the HPI and PMH.   Patient's Health History    Past Medical History:  Diagnosis Date  . Arrhythmia   . Asthma 09/23/2017  . Congestive heart failure ([place]) 09/23/2017  . COPD (chronic obstructive pulmonary disease) ([place]) 09/23/2017  . Diabetes mellitus ([place]) 09/23/2017  . Diastolic dysfunction 05/18/6642  . Esophageal reflux 09/23/2017  . Fatigue 09/23/2017  . Gout 09/23/2017  . History of prostate cancer 09/23/2017  . Hyperlipidemia 09/23/2017  . Hypertension 09/23/2017  . PVC's (premature ventricular contractions) 09/23/2017    Past Surgical History:  Procedure Laterality Date  . ABDOMINAL AORTIC ANEURYSM REPAIR    . AORTA - ILIAC ARTERY BYPASS GRAFT    . BACK SURGERY    . CATARACT EXTRACTION, BILATERAL    . CHOLECYSTECTOMY    . HEMICOLECTOMY    . HERNIA REPAIR    . PROSTATE SURGERY    . REPLACEMENT TOTAL KNEE      . STENT PLACEMENT VASCULAR ([place] HX)    . TONSILLECTOMY      Family History  Problem Relation Age of Onset  . Arthritis Mother   . Lung cancer Mother   . Hypertension Mother   . Arthritis Father   . Heart attack Father   . Prostate cancer Father   . Hypertension Father   . Hyperlipidemia Father   . Heart disease Father   . Alcohol abuse Father   . Arthritis Brother   . Lung cancer Brother     Social History   Socioeconomic History  . Marital status: Married    Spouse name: Not on file  . Number of children: Not on file  . Years of education: Not on file  . Highest education level: Not on file  Occupational History  . Not on file  Social Needs  . Financial resource strain: Not on file  . Food insecurity:    Worry: Not on file    Inability: Not on file  . Transportation needs:    Medical: Not on file    Non-medical: Not on file  Tobacco Use  . Smoking status: Former Smoker    Packs/day: 2.50    Years: 40.00    Pack years: 100.00    Types: Cigarettes    Last attempt to quit: 2014    Years since quitting: 6.1  . Smokeless tobacco: Never  Used  Substance and Sexual Activity  . Alcohol use: Not Currently  . Drug use: Not Currently  . Sexual activity: Not on file  Lifestyle  . Physical activity:    Days per week: Not on file    Minutes per session: Not on file  . Stress: Not on file  Relationships  . Social connections:    Talks on phone: Not on file    Gets together: Not on file    Attends religious service: Not on file    Active member of club or organization: Not on file    Attends meetings of clubs or organizations: Not on file    Relationship status: Not on file  . Intimate partner violence:    Fear of current or ex partner: Not on file    Emotionally abused: Not on file    Physically abused: Not on file    Forced sexual activity: Not on file  Other Topics Concern  . Not on file  Social History Narrative  . Not on file     Physical Exam  Vital  Signs and Nursing Notes reviewed Vitals:   03/06/18 1500 03/06/18 1555  BP: 125/82 126/88  Pulse: 94 (!) 103  Resp: 11 20  Temp:  97.9 F (36.6 C)  SpO2: 98% 97%    CONSTITUTIONAL: Chronically ill-appearing, NAD NEURO:  Alert and oriented x 3, no focal deficits EYES:  eyes equal and reactive ENT/NECK:  no LAD, no JVD CARDIO: Regular rate, well-perfused, normal S1 and S2 PULM:  CTAB no wheezing or rhonchi GI/GU:  normal bowel sounds, non-distended, non-tender MSK/SPINE:  No gross deformities, no edema SKIN:  no rash, atraumatic PSYCH:  Appropriate speech and behavior  Diagnostic and Interventional Summary    EKG Interpretation  Date/Time:  Thursday March 06 2018 12:20:07 EST Ventricular Rate:  94 PR Interval:    QRS Duration: 86 QT Interval:  398 QTC Calculation: 498 R Axis:   -26 Text Interpretation:  Sinus rhythm Borderline left axis deviation Borderline T wave abnormalities Borderline prolonged QT interval Confirmed by Gerlene Fee [phone]) on 03/06/2018 12:25:23 PM      Labs Reviewed  CBC - Abnormal; Notable for the following components:      Result Value   RBC 3.40 (*)    Hemoglobin 9.9 (*)    HCT 33.5 (*)    MCHC 29.6 (*)    RDW 17.4 (*)    All other components within normal limits  BASIC METABOLIC PANEL - Abnormal; Notable for the following components:   CO2 21 (*)    Glucose, Bld 138 (*)    All other components within normal limits  LACTIC ACID, PLASMA  URINALYSIS, ROUTINE W REFLEX MICROSCOPIC  HEPARIN LEVEL (UNFRACTIONATED)  TROPONIN I  TROPONIN I  I-STAT TROPONIN, ED    CT ANGIO CHEST PE W OR WO CONTRAST  Final Result    DG Chest Port 1 View  Final Result    VAS [place] LOWER EXTREMITY VENOUS (DVT)    (Results Pending)    Medications  heparin ADULT infusion 100 units/mL (25000 units/267mL sodium chloride 0.45%) (1,500 Units/hr Intravenous New Bag/Given 03/06/18 1528)  guaiFENesin (MUCINEX) 12 hr tablet 600 mg (has no administration in time range)   mometasone-formoterol (DULERA) 200-5 MCG/ACT inhaler 2 puff (has no administration in time range)  multivitamin with minerals tablet 1 tablet (has no administration in time range)  pantoprazole (PROTONIX) EC tablet 40 mg (has no administration in time range)  amitriptyline (ELAVIL) tablet 50  mg (has no administration in time range)  sacubitril-valsartan (ENTRESTO) 49-51 mg per tablet (has no administration in time range)  carvedilol (COREG) tablet 3.125 mg (has no administration in time range)  0.9 %  sodium chloride infusion (has no administration in time range)  acetaminophen (TYLENOL) tablet 650 mg (has no administration in time range)    Or  acetaminophen (TYLENOL) suppository 650 mg (has no administration in time range)  albuterol (PROVENTIL) (2.5 MG/3ML) 0.083% nebulizer solution 2.5 mg (has no administration in time range)  Ampicillin-Sulbactam (UNASYN) 3 g in sodium chloride 0.9 % 100 mL IVPB (has no administration in time range)  allopurinol (ZYLOPRIM) tablet 300 mg (has no administration in time range)  aspirin EC tablet 81 mg (has no administration in time range)  cephALEXin (KEFLEX) capsule 500 mg (has no administration in time range)  doxycycline (VIBRA-TABS) tablet 100 mg (has no administration in time range)  oxyCODONE (Oxy IR/ROXICODONE) immediate release tablet 5 mg (has no administration in time range)  umeclidinium bromide (INCRUSE ELLIPTA) 62.5 MCG/INH 1 puff (has no administration in time range)  sodium chloride 0.9 % bolus 500 mL (0 mLs Intravenous Stopped 03/06/18 1543)  iopamidol (ISOVUE-370) 76 % injection (  Contrast Given 03/06/18 1403)  heparin bolus via infusion 5,500 Units (5,500 Units Intravenous Bolus from Bag 03/06/18 1527)     Procedures Critical Care Critical Care Documentation Critical care time provided by me (excluding procedures): 38 minutes  Condition necessitating critical care: Acute pulmonary embolism, postarrest care  Components of critical  care management: reviewing of prior records, laboratory and imaging interpretation, frequent re-examination and reassessment of vital signs, administration of IV fluids, IV heparin, discussion with consulting services    ED Course and Medical Decision Making  I have reviewed the triage vital signs and the nursing notes.  Pertinent labs & imaging results that were available during my care of the patient were reviewed by me and considered in my medical decision making (see below for details).  Favoring syncopal episode rather than cardiac arrest in this 78 year old male.  EKG on arrival is reassuring.  Given history of CHF, will admit for high risk syncope.  Clinical Course as of Mar 06 1637  Thu Mar 06, 2018  1402 Given patient's lack of history of syncopal events, immobility due to chronic lower extremity pain and infection, history of malignancy, question of cardiac arrest, will evaluate with CTA of the chest to exclude pulmonary embolism.   [MB]    Clinical Course User Index [MB] Maudie Flakes, MD    CT reveals acute right pulmonary embolism.  Given IV heparin, admitted to hospital service for further care.  No evidence of right heart strain, patient is with normal vital signs, appears comfortable.  John Maddox. John Maddox, [place] [email or web address]  Final Clinical Impressions(s) / ED Diagnoses     ICD-10-CM   1. Acute pulmonary embolism without acute cor pulmonale, unspecified pulmonary embolism type (HCC) I26.99   2. Syncope R55 DG Chest [facility] 1 View    DG Chest [facility]    ED Discharge Orders    None         John Maddox, [place], MD 03/06/18 [phone]

## 2018-03-06 NOTE — ED Triage Notes (Addendum)
To ED from [place] office with EMS stating that they were called for cardiac arrest-- on ems arrival pt was alert/oriented x 4- vomited, staff stated that he had agonal resp, and completed 4 cycles of CPR-    116/72-106/74 P-93

## 2018-03-06 NOTE — Progress Notes (Signed)
ANTICOAGULATION CONSULT NOTE - Initial Consult  Pharmacy Consult for heparin Indication: pulmonary embolus  Allergies  Allergen Reactions  . Lantus [Insulin Glargine]     Stomach pain  . Quinolones Other (See Comments)    History of aortic aneurysm status post repair.  Use with caution    Patient Measurements: Height: 5\' 10"  (177.8 cm) Weight: 228 lb 2.8 oz (103.5 kg) IBW/kg (Calculated) : 73 Heparin Dosing Weight: 94.9kg  Vital Signs: Temp: 97.7 F (36.5 C) (02/20 1058) Temp Source: Oral (02/20 1058) BP: 125/75 (02/20 1400) Pulse Rate: 92 (02/20 1400)  Labs: Recent Labs    03/06/18 1242  HGB 9.9*  HCT 33.5*  PLT 215  CREATININE 0.77    Estimated Creatinine Clearance: 94.7 mL/min (by C-G formula based on SCr of 0.77 mg/dL).   Medical History: Past Medical History:  Diagnosis Date  . Arrhythmia   . Asthma 09/23/2017  . Congestive heart failure ([place]) 09/23/2017  . COPD (chronic obstructive pulmonary disease) ([place]) 09/23/2017  . Diabetes mellitus ([place]) 09/23/2017  . Diastolic dysfunction 03/22/1769  . Esophageal reflux 09/23/2017  . Fatigue 09/23/2017  . Gout 09/23/2017  . History of prostate cancer 09/23/2017  . Hyperlipidemia 09/23/2017  . Hypertension 09/23/2017  . PVC's (premature ventricular contractions) 09/23/2017    Medications:  Infusions:  . ampicillin-sulbactam (UNASYN) IV    . heparin    . sodium chloride      Assessment: 18 yom presented to the ED from MD office s/p CPR. Found to have a PE and now starting IV heparin. Baseline Hgb is slightly low and platelets are WNL. He is not on anticoagulation PTA. Plan to transition to apixaban tomorrow pending further work-up and patient status.   Goal of Therapy:  Heparin level 0.3-0.7 units/ml Monitor platelets by anticoagulation protocol: Yes   Plan:  Heparin bolus 5500 units IV x 1 Heparin gtt 1500 units/hr Check an 8 hr heparin level Daily heparin level and CBC F/u ability to transition to apixaban  tomorrow  Zaineb Nowaczyk, Rande Lawman 03/06/2018,2:54 PM

## 2018-03-06 NOTE — H&P (Signed)
TRH H&P   Patient Demographics:    Payson Evrard, is a 78 y.o. male  MRN: [id number]   DOB - 03-Jun-1940  Admit Date - 03/06/2018  Outpatient Primary MD for the patient is Renaldo Reel, PA  Referring MD/NP/PA: Dr Sedonia Small  Outpatient Specialists: ID  [email or web address], oncology Dr. Anabel Bene at [facility]  Patient coming from: HOME>>ID clinic>ED  Chief Complaint  Patient presents with  . post cpr      HPI:    Nazario Russom  is a 78 y.o. male, with past medical history of total knee replacement around 2 years ago, status post infection, tension, hyperlipidemia, [place] CHF, COPD, tobacco abuse, abdominal aortic aneurysm with a stent placement, stage III non-small cell lung cancer followed at [facility], recent hospitalization at [facility] where he went washout by Ortho for his left knee, prolonged IV antibiotic therapy, currently following with ID about antibiotic regimen. -Patient was at ID office, been given recommendation for his prolonged antibiotic therapy, waiting for his labs to be done, he was found to be unresponsive, brought down to the floor, healthcare providers there were unable to feel his pulse, so they initiated see CPR, lasted 3 to 5 minutes, until EMS arrived, for which they found he has a pulse, became responsive, return back to baseline, alert oriented appropriate, he denies any complaints side his chronic left knee pain related to chronic infection, he denies any fever, cough, chest pain, shortness of breath, or abdominal pain. - in ED patient's vital signs were stable, CTA chest was obtained to evaluate for his syncope versus PEA arrest, which was significant for all PE, pneumonia, and known aortic aneurysm, he was started on heparin drip and I was called to admit.    Review of systems:    In addition to the HPI above,  No Fever-chills,  had syncope No Headache, No changes with Vision or hearing, No problems swallowing food or Liquids, No Chest pain, Cough or Shortness of Breath, No Abdominal pain, No Nausea or Vommitting, Bowel movements are regular, No Blood in stool or Urine, No dysuria, No new skin rashes or bruises, Planes of chronic left knee pain No new weakness, tingling, numbness in any extremity, No recent weight gain or loss, No polyuria, polydypsia or polyphagia, No significant Mental Stressors.  A full 10 point Review of Systems was done, except as stated above, all other Review of Systems were negative.   With Past History of the following :    Past Medical History:  Diagnosis Date  . Arrhythmia   . Asthma 09/23/2017  . Congestive heart failure ([place]) 09/23/2017  . COPD (chronic obstructive pulmonary disease) ([place]) 09/23/2017  . Diabetes mellitus ([place]) 09/23/2017  . Diastolic dysfunction 0/09/3816  . Esophageal reflux 09/23/2017  . Fatigue 09/23/2017  . Gout 09/23/2017  . History of prostate cancer 09/23/2017  . Hyperlipidemia 09/23/2017  .  Hypertension 09/23/2017  . PVC's (premature ventricular contractions) 09/23/2017      Past Surgical History:  Procedure Laterality Date  . ABDOMINAL AORTIC ANEURYSM REPAIR    . AORTA - ILIAC ARTERY BYPASS GRAFT    . BACK SURGERY    . CATARACT EXTRACTION, BILATERAL    . CHOLECYSTECTOMY    . HEMICOLECTOMY    . HERNIA REPAIR    . PROSTATE SURGERY    . REPLACEMENT TOTAL KNEE    . STENT PLACEMENT VASCULAR ([place] HX)    . TONSILLECTOMY        Social History:     Social History   Tobacco Use  . Smoking status: Former Smoker    Packs/day: 2.50    Years: 40.00    Pack years: 100.00    Types: Cigarettes    Last attempt to quit: 2014    Years since quitting: 6.1  . Smokeless tobacco: Never Used  Substance Use Topics  . Alcohol use: Not Currently     Lives -at home, reports wheelchair dependent for last 48-month      Family History :     Family History    Problem Relation Age of Onset  . Arthritis Mother   . Lung cancer Mother   . Hypertension Mother   . Arthritis Father   . Heart attack Father   . Prostate cancer Father   . Hypertension Father   . Hyperlipidemia Father   . Heart disease Father   . Alcohol abuse Father   . Arthritis Brother   . Lung cancer Brother      Home Medications:   Prior to Admission medications   Medication Sig Start Date End Date Taking? Authorizing Provider  allopurinol (ZYLOPRIM) 300 MG tablet Take 300 mg by mouth daily.   Yes [provider]  amitriptyline (ELAVIL) 50 MG tablet Take 50 mg by mouth as needed for sleep.  10/21/17  Yes [provider]  aspirin EC 81 MG tablet Take 81 mg by mouth daily.   Yes [provider]  celecoxib (CELEBREX) 200 MG capsule Take 200 mg by mouth 2 (two) times daily.   Yes [provider]  fluticasone-salmeterol (ADVAIR HFA) 115-21 MCG/ACT inhaler Inhale 2 puffs into the lungs 2 (two) times daily.  05/22/17  Yes [provider]  guaiFENesin (MUCINEX) 600 MG 12 hr tablet Take 600 mg by mouth 2 (two) times daily.   Yes [provider]  Multiple Vitamin (MULTI-VITAMINS) TABS Take 1 tablet by mouth daily.   Yes [provider]  omeprazole (PRILOSEC) 20 MG capsule Take 20 mg by mouth daily. 12/24/17  Yes [provider]  oxyCODONE (OXY IR/ROXICODONE) 5 MG immediate release tablet Take 5 mg by mouth 3 (three) times daily.  09/26/17  Yes [provider]  PROAIR HFA 108 (90 Base) MCG/ACT inhaler Inhale 1 puff into the lungs every 6 (six) hours as needed.  08/09/17  Yes [provider]  sacubitril-valsartan (ENTRESTO) 49-51 MG Take 1 tablet by mouth 2 (two) times daily. 02/06/18  Yes Richardo Priest, MD  tiotropium (SPIRIVA HANDIHALER) 18 MCG inhalation capsule Place 18 mcg into inhaler and inhale daily.  09/19/17  Yes [provider]  carvedilol (COREG) 3.125 MG tablet Take 3.125 mg by mouth 2  (two) times daily. 02/04/18   [provider]  cefTRIAXone (ROCEPHIN) IVPB Inject 2 g into the vein daily. Family IV push    [provider]  cephALEXin (KEFLEX) 500 MG capsule Take  1 capsule (500 mg total) by mouth 4 (four) times daily. Needs 4.5 months total 03/06/18   Comer, Okey Regal, MD  daptomycin (CUBICIN) IVPB Inject 500 mg into the vein daily. Iv push (family)    [provider]  doxycycline (VIBRA-TABS) 100 MG tablet Take 1 tablet (100 mg total) by mouth 2 (two) times daily. 03/06/18   Thayer Headings, MD     Allergies:     Allergies  Allergen Reactions  . Lantus [Insulin Glargine]     Stomach pain  . Quinolones Other (See Comments)    History of aortic aneurysm status post repair.  Use with caution     Physical Exam:   Vitals  Blood pressure 125/82, pulse 94, resp. rate 11, height 5\' 10"  (1.778 m), weight 103.5 kg, SpO2 98 %.   1. General elderly male, laying in bed in no apparent distress .  2. Normal affect and insight, Not Suicidal or Homicidal, Awake Alert, Oriented X 3.  3. No F.N deficits, ALL C.Nerves Intact, Strength 5/5 all 4 extremities, Sensation intact all 4 extremities, Plantars down going.  4. Ears and Eyes appear Normal, Conjunctivae clear, PERRLA. Moist Oral Mucosa.  5. Supple Neck, No JVD, No cervical lymphadenopathy appriciated, No Carotid Bruits.  6. Symmetrical Chest wall movement, Good air movement bilaterally, CTAB.  7. RRR, No Gallops, Rubs or Murmurs, No Parasternal Heave.  8. Positive Bowel Sounds, Abdomen Soft, No tenderness, No organomegaly appriciated,No rebound -guarding or rigidity.  9.  No Cyanosis, Normal Skin Turgor, No Skin Rash or Bruise.  10. Good muscle tone,  joints appear normal , no effusions, Normal ROM.  11. No Palpable Lymph Nodes in Neck or Axillae     Data Review:    CBC Recent Labs  Lab 03/06/18 1242  WBC 5.3  HGB 9.9*  HCT 33.5*  PLT 215  MCV 98.5  MCH 29.1  MCHC 29.6*  RDW  17.4*   ------------------------------------------------------------------------------------------------------------------  Chemistries  Recent Labs  Lab 03/06/18 1242  NA 140  K 3.6  CL 108  CO2 21*  GLUCOSE 138*  BUN 8  CREATININE 0.77  CALCIUM 8.9   ------------------------------------------------------------------------------------------------------------------ estimated creatinine clearance is 94.7 mL/min (by C-G formula based on SCr of 0.77 mg/dL). ------------------------------------------------------------------------------------------------------------------ No results for input(s): TSH, T4TOTAL, T3FREE, THYROIDAB in the last 72 hours.  Invalid input(s): FREET3  Coagulation profile No results for input(s): INR, PROTIME in the last 168 hours. ------------------------------------------------------------------------------------------------------------------- No results for input(s): DDIMER in the last 72 hours. -------------------------------------------------------------------------------------------------------------------  Cardiac Enzymes No results for input(s): CKMB, TROPONINI, MYOGLOBIN in the last 168 hours.  Invalid input(s): CK ------------------------------------------------------------------------------------------------------------------ No results found for: BNP   ---------------------------------------------------------------------------------------------------------------  Urinalysis No results found for: COLORURINE, APPEARANCEUR, LABSPEC, PHURINE, GLUCOSEU, HGBUR, BILIRUBINUR, KETONESUR, PROTEINUR, UROBILINOGEN, NITRITE, LEUKOCYTESUR  ----------------------------------------------------------------------------------------------------------------   Imaging Results:    Ct Angio Chest Pe W Or Wo Contrast  Result Date: 03/06/2018 CLINICAL DATA:  Status post CPR for cardiac arrest. Status post chemotherapy and radiation therapy for lung cancer. EXAM:  CT ANGIOGRAPHY CHEST WITH CONTRAST TECHNIQUE: Multidetector CT imaging of the chest was performed using the standard protocol during bolus administration of intravenous contrast. Multiplanar CT image reconstructions and MIPs were obtained to evaluate the vascular anatomy. CONTRAST:  75 cc Isovue 370 COMPARISON:  Portable chest obtained earlier today. Chest CTA dated 01/01/2018. FINDINGS: Cardiovascular: Interval small to moderate-sized non occluding thrombus bridging two right lower lobe pulmonary arteries and extending into both arteries with opacified blood around the clot in both  arteries. No other pulmonary arterial filling defects are seen. The aortic arch remains dilated with a maximum transverse diameter of 3.9 cm on image number 31 series 5, without significant change. The distal descending thoracic aorta also remains dilated with a maximum transverse diameter of 4.3 cm on image number 74 series 5, unchanged. On sagittal image number 76 of series 9, this measures 4.9 cm in maximum diameter. The proximal abdominal aorta also remains dilated with a maximum diameter 4.6 cm on image number 105 series 5, unchanged. A prominent epicardial fat pad is again demonstrated with no pericardial fluid. The heart remains borderline enlarged. Atheromatous calcifications, including the coronary arteries and aorta. Mediastinum/Nodes: The previously demonstrated 1.3 cm short axis right hilar node currently has a short axis diameter of 1.4 cm on image number 45 series 5. Additional smaller enlarged right hilar nodes have not changed significantly. Unremarkable thyroid gland and esophagus. Lungs/Pleura: Interval small right pleural effusion and interval patchy opacity in the right lower lobe as well as interval patchy and confluent opacity in the right upper lobe. These changes are partially obscuring the previously demonstrated 4.0 x 2.8 cm spiculated mass in the posterior right upper lobe. This currently measures approximately  3.8 x 2.9 cm in corresponding dimensions on image number 43 series 7. Previously demonstrated mild changes of centrilobular emphysema. Upper Abdomen: Cholecystectomy clips. Musculoskeletal: Thoracic and lower cervical spine degenerative changes. Review of the MIP images confirms the above findings. IMPRESSION: 1. Small to moderate-sized non occluding right lower lobe pulmonary embolism. 2. Extensive right lung probable pneumonia with an associated small right pleural effusion. 3. No gross change in the previously demonstrated right upper lobe mass compatible with the patient's known lung cancer. 4. Stable probable metastatic right hilar adenopathy. 5. Stable aneurysmal dilatation of the aortic arch, distal descending thoracic aorta and proximal abdominal aorta. Recommend followup by abdomen and pelvis CTA in 6 months, and vascular surgery referral/consultation if not already obtained. This recommendation follows ACR consensus guidelines: White Paper of the ACR Incidental Findings Committee II on Vascular Findings. J Am Coll Radiol 2013; 10:789-794. Aortic aneurysm NOS (ICD10-I71.9) 6. Stable mild changes of centrilobular emphysema. 7.  Calcific coronary artery and aortic atherosclerosis. Critical Value/emergent results were called by telephone at the time of interpretation on 03/06/2018 at 2:18 pm to Dr. Gerlene Fee , who verbally acknowledged these results. Aortic Atherosclerosis (ICD10-I70.0) and Emphysema (ICD10-J43.9). Electronically Signed   By: Claudie Revering M.D.   On: 03/06/2018 14:31   Dg Chest Port 1 View  Result Date: 03/06/2018 CLINICAL DATA:  Syncopal episode today.  History of lung cancer. EXAM: PORTABLE CHEST 1 VIEW COMPARISON:  Single-view of the chest 01/31/2018. CT chest 01/01/2018. FINDINGS: Port-A-Cath remains in place. The left lung is clear. There is airspace disease throughout the right lung, worst in the mid and upper lung zones. Aeration is improved compared to the most recent examination.  There is volume loss in the right chest. No pneumothorax or pleural effusion. Right upper lobe mass lesion seen on prior CT scan is identified. Heart size is normal. No acute bony abnormality. IMPRESSION: Airspace disease in the right chest is most notable in the mid and upper lung zones and appears improved compared to the most recent examination compatible with resolving pneumonia. Right upper lobe mass lesion is noted but better seen on prior CT. Emphysema. Electronically Signed   By: Inge Rise M.D.   On: 03/06/2018 12:49    My personal review of EKG: Rhythm NSR, Rate  98 /min, QTc 498 , no Acute ST changes   Assessment & Plan:    Active Problems:   Diabetes mellitus (HCC)   COPD (chronic obstructive pulmonary disease) (HCC)   Esophageal reflux   Hyperlipidemia   Lung mass   Chronic combined systolic and diastolic heart failure (HCC)   Infected prosthetic knee joint, initial encounter ([place])   Pulmonary emboli (HCC)   Pulmonary embolism ([place])   Acute pulmonary embolism -Patient presents with syncope and collapse, his CTA chest significant for right lower lobe pulmonary embolism, he is with known lung malignancy, poor ambulatory status, will need long-term anticoagulation, but I will defer him length of treatment to his primary hematologist/oncologist at end of hospital Dr. Anabel Bene. -We will start on heparin GTT, monitor on telemetry, transition to Eliquis in a.m., will obtain venous Doppler to rule out DVT, will obtain 2D echo to rule out right heart strain.  Syncope versus PEA arrest -Description per EMS, apparently this was syncope, patient denies any chest pain or shortness of breath, this is syncope most likely related to his pulmonary embolism, will monitor on telemetry, obtain 2D echo, and cycle troponins.  Aspiration pneumonia -Afebrile, with no hypoxia or dyspnea, CT chest significant for right lung pneumonia, likely aspiration pneumonia sustained during his syncope  event, I will start on Unasyn, transitioned to Augmentin before discharge tomorrow.  History of left knee infection status post incision and debridement and retention of hardware, culture negative -Per ID recommendation to use Keflex and doxycycline for 4-1/62-month I will continue during hospital stay.  Lung Cancer/right upper lung mass -Is been followed by Dr. Anabel Bene oncology at [facility], lip port last chemo was last December, and plan for immunotherapy.  Anemia of chronic disease -Is most likely anemia due to malignancy, and chronic illness, hemoglobin at baseline, continue to monitor  Debility -Reports he is wheelchair dependent for last 23-month, will consult PT, likely will need home health on discharge  Gout -Resume home meds  Chronic systolic and diastolic systolic CHF -Reviewing records from care everywhere, apparently he had low EF in the past, but this has been normalized with medication, continue with Coreg and Entresto, appears to be euvolemic  COPD -No active wheezing, continue with home meds  Hypertension -Blood pressure acceptable, continue with home meds  GERD -Continue  with PPI  History  of aortic aneurysm -Need repeat CTA in 67-month  DVT Prophylaxis Heparin GTT  AM Labs Ordered, also please review Full Orders  Family Communication: Admission, patients condition and plan of care including tests being ordered have been discussed with the patient  who indicate understanding and agree with the plan and Code Status.  Code Status Full  Likely DC to  : Home will need PT consult  Condition GUARDED    Consults called: None  Admission status: Observation  Time spent in minutes : 65 minutes   Phillips Climes M.D on 03/06/2018 at 3:26 PM  Between 7am to 7pm - Pager - [phone]. After 7pm go to [email or web address] - password [facility]  [facility] - Office  [phone]

## 2018-03-06 NOTE — Progress Notes (Signed)
Bilateral lower extremity venous duplex completed.  Preliminary results in Chart review CV Proc. [place] Kirstie Larsen,RVS 03/06/2018 4:47 PM

## 2018-03-07 ENCOUNTER — Ambulatory Visit (HOSPITAL_BASED_OUTPATIENT_CLINIC_OR_DEPARTMENT_OTHER): Payer: Medicare Other

## 2018-03-07 ENCOUNTER — Telehealth: Payer: Self-pay | Admitting: Cardiology

## 2018-03-07 DIAGNOSIS — I2699 Other pulmonary embolism without acute cor pulmonale: Secondary | ICD-10-CM | POA: Diagnosis not present

## 2018-03-07 DIAGNOSIS — K219 Gastro-esophageal reflux disease without esophagitis: Secondary | ICD-10-CM | POA: Diagnosis not present

## 2018-03-07 DIAGNOSIS — E876 Hypokalemia: Secondary | ICD-10-CM | POA: Diagnosis not present

## 2018-03-07 DIAGNOSIS — I5042 Chronic combined systolic (congestive) and diastolic (congestive) heart failure: Secondary | ICD-10-CM | POA: Diagnosis not present

## 2018-03-07 DIAGNOSIS — T8459XA Infection and inflammatory reaction due to other internal joint prosthesis, initial encounter: Secondary | ICD-10-CM | POA: Diagnosis not present

## 2018-03-07 DIAGNOSIS — R55 Syncope and collapse: Secondary | ICD-10-CM | POA: Diagnosis not present

## 2018-03-07 DIAGNOSIS — Z96659 Presence of unspecified artificial knee joint: Secondary | ICD-10-CM

## 2018-03-07 DIAGNOSIS — R918 Other nonspecific abnormal finding of lung field: Secondary | ICD-10-CM

## 2018-03-07 DIAGNOSIS — J449 Chronic obstructive pulmonary disease, unspecified: Secondary | ICD-10-CM

## 2018-03-07 LAB — HEPARIN LEVEL (UNFRACTIONATED)
Heparin Unfractionated: 1.03 IU/mL — ABNORMAL HIGH (ref 0.30–0.70)
Heparin Unfractionated: 0.77 IU/mL — ABNORMAL HIGH (ref 0.30–0.70)

## 2018-03-07 LAB — CBC
HEMATOCRIT: 30.6 % — AB (ref 39.0–52.0)
Hemoglobin: 9.2 g/dL — ABNORMAL LOW (ref 13.0–17.0)
MCH: 29.3 pg (ref 26.0–34.0)
MCHC: 30.1 g/dL (ref 30.0–36.0)
MCV: 97.5 fL (ref 80.0–100.0)
Platelets: 232 10*3/uL (ref 150–400)
RBC: 3.14 MIL/uL — ABNORMAL LOW (ref 4.22–5.81)
RDW: 17.3 % — ABNORMAL HIGH (ref 11.5–15.5)
WBC: 5.3 10*3/uL (ref 4.0–10.5)
nRBC: 0 % (ref 0.0–0.2)

## 2018-03-07 LAB — BASIC METABOLIC PANEL
Anion gap: 8 (ref 5–15)
BUN: 6 mg/dL — ABNORMAL LOW (ref 8–23)
CO2: 24 mmol/L (ref 22–32)
Calcium: 8.7 mg/dL — ABNORMAL LOW (ref 8.9–10.3)
Chloride: 108 mmol/L (ref 98–111)
Creatinine, Ser: 0.78 mg/dL (ref 0.61–1.24)
GFR calc Af Amer: >60 mL/min (ref >60)
GFR calc non Af Amer: >60 mL/min (ref >60)
GLUCOSE: 113 mg/dL — AB (ref 70–99)
Potassium: 3.4 mmol/L — ABNORMAL LOW (ref 3.5–5.1)
Sodium: 140 mmol/L (ref 135–145)

## 2018-03-07 LAB — TROPONIN I: Troponin I: <0.03 ng/mL (ref <0.03)

## 2018-03-07 MED ORDER — APIXABAN 5 MG PO TABS: ORAL_TABLET | ORAL | 0 refills | Status: DC

## 2018-03-07 MED ORDER — APIXABAN 5 MG PO TABS
10.0000 mg | ORAL_TABLET | Freq: Two times a day (BID) | ORAL | Status: DC
  Administered 2018-03-07 (×2): 10 mg via ORAL
  Filled 2018-03-07 (×2): qty 2

## 2018-03-07 MED ORDER — HEPARIN SOD (PORK) LOCK FLUSH 100 UNIT/ML IV SOLN
500.0000 [IU] | INTRAVENOUS | Status: AC | PRN
  Administered 2018-03-07: 500 [IU]
  Filled 2018-03-07: qty 5

## 2018-03-07 MED ORDER — PERFLUTREN LIPID MICROSPHERE
1.0000 mL | INTRAVENOUS | Status: AC | PRN
  Administered 2018-03-07: 2 mL via INTRAVENOUS
  Filled 2018-03-07: qty 10

## 2018-03-07 NOTE — Care Management (Signed)
#   3     S/W    [facility]  @ [place] # [phone]   1. ELIQUIS  2.5 MG BID COVER- YES CO-PAY- $ 33.00 TIER- NO PRIOR APPROVAL- NO  2. ELIQUIS  5MG  BID COVER- YES CO-PAY- $ 33.00 TIER- NO PRIOR APPROVAL-  NO  3. XARELTO  15 MG BID COVER- YES CO-PAY- $ 33.00 TIER- NO PRIOR APPROVAL- NO  4. XARELTO  20 MG DAILY COVER- YES CO-PAY- $ 33.00 TIER- NO PRIOR APPROVAL- NO  PREFERRED PHARMACY : YES [facility] M/O 90 DAY SUPPLY FOR M/O $ 29.00 FIRST TWO REFILL AT RETAIL NEXT REFILL MUST BE M/O

## 2018-03-07 NOTE — Progress Notes (Signed)
ANTICOAGULATION CONSULT NOTE - Follow-up Consult  Pharmacy Consult for heparin Indication: pulmonary embolus  Allergies  Allergen Reactions  . Lantus [Insulin Glargine]     Stomach pain  . Quinolones Other (See Comments)    History of aortic aneurysm status post repair.  Use with caution    Patient Measurements: Height: 5\' 10"  (177.8 cm) Weight: 228 lb 2.8 oz (103.5 kg) IBW/kg (Calculated) : 73 Heparin Dosing Weight: 94.9kg  Vital Signs: Temp: 97.5 F (36.4 C) (02/20 2325) Temp Source: Oral (02/20 2325) BP: 101/65 (02/20 2325) Pulse Rate: 95 (02/20 2325)  Labs: Recent Labs    03/06/18 1242 03/06/18 2159 03/07/18 0215  HGB 9.9*  --   --   HCT 33.5*  --   --   PLT 215  --   --   HEPARINUNFRC  --   --  1.03*  CREATININE 0.77  --   --   TROPONINI  --  <0.03  --     Estimated Creatinine Clearance: 94.7 mL/min (by C-G formula based on SCr of 0.77 mg/dL).  Assessment: 19 yom presented to the ED from MD office s/p CPR. Found to have a PE and started on IV heparin. Baseline Hgb is slightly low and platelets are WNL. He is not on anticoagulation PTA. Plan to transition to apixaban later today pending further work-up and patient status - consult already in place for this.  Heparin level supratherapeutic(1.03) on gtt at 1500 units/hr. Lab drawn from port, heparin running in hand, no bleeding noted.   Goal of Therapy:  Heparin level 0.3-0.7 units/ml Monitor platelets by anticoagulation protocol: Yes   Plan:  Decrease heparin gtt down to 1200 units/hr Will not order further heparin levels as appears pt to transition to apixaban today  Sherlon Handing, PharmD, BCPS Clinical pharmacist  **Pharmacist phone directory can now be found on [email or web address] ([email or web address]).  Listed under [place]. 03/07/2018,2:45 AM

## 2018-03-07 NOTE — Telephone Encounter (Signed)
  Wife of patient calling to let Dr Bettina Gavia know that John Maddox is in the hospital

## 2018-03-07 NOTE — Evaluation (Signed)
Physical Therapy Evaluation and Discharge Patient Details Name: John Maddox MRN: [id number] DOB: 1940/05/20 Today's Date: 03/07/2018   History of Present Illness  Pt is a 78 y/o male who presents to PT s/p 3-5 CPR after becoming unresponsive at MD office while waiting for labs. CT revealed PE, PNA, and known aortic aneurysm. PMH also significant for asthma, CHF, COPD, DM, prostate CA, PVC's, AAA repair, aorta-iliac artery bypass graft, back surgery, bilateral cataracts extracted, L TKR.   Clinical Impression  Patient evaluated by Physical Therapy with no further acute PT needs identified. All education has been completed and the patient has no further questions. Pt reports being significantly impaired since December and has not walked since then. It appears that pt is at a transfers only level and has been utilizing a wheelchair for mobility. Pt states he has had "4 rounds" of PT and adamantly refuses any further PT services here, at home, or at outpatient because it "didn't help" him. Feel with appropriate PT follow up pt could actually improve overall functional independence. See below for any follow-up Physical Therapy or equipment needs. PT is signing off per pt request. Thank you for this referral.     Follow Up Recommendations No PT follow up;Supervision for mobility/OOB(Pt refusing HHPT)    Equipment Recommendations  None recommended by PT    Recommendations for Other Services       Precautions / Restrictions Precautions Precautions: Fall Restrictions Weight Bearing Restrictions: No      Mobility  Bed Mobility Overal bed mobility: Modified Independent             General bed mobility comments: Increased time and effort but able to complete without assistance.   Transfers Overall transfer level: Needs assistance Equipment used: None Transfers: Lateral/Scoot Transfers          Lateral/Scoot Transfers: Supervision General transfer comment: Pt refused attempt to  stand or walk stating he hasn't been able to walk since December and wont be able to now. He did laterally scoot from bed>chair without arm rests with supervision for safety and therapist to stabilize the chair.   Ambulation/Gait             General Gait Details: Not attempted per pt refusal  Stairs            Wheelchair Mobility    Modified Rankin (Stroke Patients Only)       Balance                                             Pertinent Vitals/Pain Pain Assessment: No/denies pain    Home Living Family/patient expects to be discharged to:: Private residence Living Arrangements: Spouse/significant other Available Help at Discharge: Family;Available 24 hours/day Type of Home: House         Home Equipment: Walker - 2 wheels;Bedside commode;Shower seat;Grab bars - tub/shower;Wheelchair - power;Transport chair      Prior Function Level of Independence: Needs assistance   Gait / Transfers Assistance Needed: Pt reports he has not walked since December and is at a transfers only level.   ADL's / Homemaking Assistance Needed: Has not been able to shower since he stopped walking - has been using wipes to cleanse himself. When asked about dressing, pt only reports he wears a brief and a t-shirt at home "so he doesn't have to worry about dressing."  Hand Dominance        Extremity/Trunk Assessment   Upper Extremity Assessment Upper Extremity Assessment: Defer to OT evaluation    Lower Extremity Assessment Lower Extremity Assessment: Generalized weakness(4-/5 quads/hamstrings/hip flexors)    Cervical / Trunk Assessment Cervical / Trunk Assessment: Other exceptions Cervical / Trunk Exceptions: Forward head posture with rounded shoulders  Communication      Cognition Arousal/Alertness: Awake/alert Behavior During Therapy: (Verbally aggressive at times) Overall Cognitive Status: Within Functional Limits for tasks assessed                                         General Comments      Exercises     Assessment/Plan    PT Assessment Patent does not need any further PT services(Pt refusing)  PT Problem List Decreased strength;Decreased activity tolerance;Decreased balance;Decreased mobility;Decreased knowledge of use of DME;Decreased safety awareness       PT Treatment Interventions      PT Goals (Current goals can be found in the Care Plan section)  Acute Rehab PT Goals Patient Stated Goal: Be able to walk again and get back to PLOF (prior to December) PT Goal Formulation: All assessment and education complete, DC therapy    Frequency     Barriers to discharge        Co-evaluation               AM-PAC PT "6 Clicks" Mobility  Outcome Measure Help needed turning from your back to your side while in a flat bed without using bedrails?: None Help needed moving from lying on your back to sitting on the side of a flat bed without using bedrails?: None Help needed moving to and from a bed to a chair (including a wheelchair)?: None Help needed standing up from a chair using your arms (e.g., wheelchair or bedside chair)?: A Lot Help needed to walk in hospital room?: Total Help needed climbing 3-5 steps with a railing? : Total 6 Click Score: 16    End of Session   Activity Tolerance: Patient tolerated treatment well Patient left: in bed;with call bell/phone within reach;Other (comment)(MD in room)   PT Visit Diagnosis: Muscle weakness (generalized) (M62.81);Difficulty in walking, not elsewhere classified (R26.2);Other abnormalities of gait and mobility (R26.89)    Time: 0932-6712 PT Time Calculation (min) (ACUTE ONLY): 23 min   Charges:   PT Evaluation $PT Eval High Complexity: 1 High PT Treatments $Therapeutic Activity: 8-22 mins        Rolinda Roan, PT, DPT Acute Rehabilitation Services Pager: [phone] Office: [phone]   Thelma Comp 03/07/2018, 1:05 PM

## 2018-03-07 NOTE — Care Management Note (Signed)
Case Management Note  Patient Details  Name: Jonmarc Radilla MRN: [id number] Date of Birth: 07/21/1940  Subjective/Objective:  77 yo male presented after becoming unresponsive at MD office; CT revealed PE, PNA and known aortic aneurysm; PMH:  asthma, CHF, COPD, DM, prostate CA, PVC's, AAA repair, aorta-iliac artery bypass graft, back surgery, bilateral cataracts extracted, L TKR.                 Action/Plan: CM met with patient/spouse to discuss transitional needs. Patient lived at home with spouse who assists with patients ADLs; patient has a RW, wheelchair (power), BSC, shower seat and transport chair. PCP: Kate Yates, PA; pharmacy of choice: [facility], [place]. PT eval completed with HHPT recommended, with patient refusing. Eliquis benefits check complete with est monthly copay cost $33.00 with patient informed. CM provided patient with an Eliquis 30-day free card. No further needs from CM.   Expected Discharge Date:  03/07/18               Expected Discharge Plan:  Home/Self Care  In-House Referral:  NA  Discharge planning Services  CM Consult, Medication Assistance(Eliquis benefits check/30-day free card provided)  Post Acute Care Choice:  NA Choice offered to:  NA  DME Arranged:  N/A DME Agency:  NA  HH Arranged:  NA HH Agency:  NA  Status of Service:  Completed, signed off  If discussed at Long Length of Stay Meetings, dates discussed:    Additional Comments:    RN, BSN, NCM-BC, ACM-RN [phone] 03/07/2018, 4:39 PM  

## 2018-03-07 NOTE — Care Management Obs Status (Signed)
[place] NOTIFICATION   Patient Details  Name: John Maddox MRN: [id number] Date of Birth: 15-Jul-1940   Medicare Observation Status Notification Given:  Yes    Midge Minium RN, BSN, NCM-BC, ACM-RN [phone] 03/07/2018, 4:37 PM

## 2018-03-07 NOTE — Progress Notes (Signed)
ANTICOAGULATION CONSULT NOTE - Follow-up Consult  Pharmacy Consult for apixaban Indication: pulmonary embolus  Allergies  Allergen Reactions  . Lantus [Insulin Glargine]     Stomach pain  . Quinolones Other (See Comments)    History of aortic aneurysm status post repair.  Use with caution    Patient Measurements: Height: 5\' 10"  (177.8 cm) Weight: 228 lb 2.8 oz (103.5 kg) IBW/kg (Calculated) : 73 Heparin Dosing Weight: 94.9kg  Vital Signs:    Labs: Recent Labs    03/06/18 1242 03/06/18 2159 03/07/18 0215 03/07/18 0325  HGB 9.9*  --   --  9.2*  HCT 33.5*  --   --  30.6*  PLT 215  --   --  232  HEPARINUNFRC  --   --  1.03* 0.77*  CREATININE 0.77  --   --  0.78  TROPONINI  --  <0.03 <0.03  --     Estimated Creatinine Clearance: 94.7 mL/min (by C-G formula based on SCr of 0.78 mg/dL).  Assessment: 32 yom presented to the ED from MD office s/p CPR. Found to have a PE and started on IV heparin. Baseline Hgb is slightly low and platelets are WNL. He is not on anticoagulation PTA. Plan to transition to apixaban now.    Goal of Therapy:  Monitor platelets by anticoagulation protocol: Yes   Plan:  Apixaban 10mg  PO BID x 7 days, then 5mg  PO BID D/C heparin once initial dose of apixaban given.  Monitor for bleeding.   Dellar Traber A. Levada Dy, PharmD, [place] Please utilize Amion for appropriate phone number to reach the unit pharmacist ([place])   03/07/2018,12:00 PM

## 2018-03-07 NOTE — Progress Notes (Signed)
  Echocardiogram 2D Echocardiogram has been performed.  John Maddox 03/07/2018, 9:35 AM

## 2018-03-07 NOTE — Care Management (Addendum)
Eliquis and Xarelto benefits check sent and pending.  Midge Minium RN, BSN, NCM-BC, ACM-RN [phone]

## 2018-03-07 NOTE — Progress Notes (Signed)
Pt and wife verbalized understanding of d/c instructions. Medication changes explained in detail, along with instructions for follow. Pt and family updated on results and explained they will receive a call back with updated results. Pt uses home oxygen at home. Pt discharged with his own portable oxygen equipment. Pt stable. Nurse technician escorted patient out via wheelchair.

## 2018-03-10 ENCOUNTER — Telehealth: Payer: Self-pay | Admitting: *Deleted

## 2018-03-10 NOTE — Discharge Summary (Signed)
John Maddox, is a 78 y.o. male  DOB 12/19/1940  MRN [id number].  Admission date:  03/06/2018  Admitting Physician  Albertine Patricia, MD  Discharge Date:  03/07/2018   Primary MD  Renaldo Reel, PA  Recommendations for primary care physician for things to follow:   -Echo report -Check CBC and BMP  Discharge Diagnosis   Principal Problem:   Pulmonary emboli [facility]) Active Problems:   Diabetes mellitus ([place])   COPD (chronic obstructive pulmonary disease) ([place])   Esophageal reflux   Hyperlipidemia   Lung mass   Chronic combined systolic and diastolic heart failure ([place])   Infected prosthetic knee joint, initial encounter ([place])   Pulmonary embolism ([place])      Past Medical History:  Diagnosis Date  . Arrhythmia   . Asthma 09/23/2017  . Congestive heart failure ([place]) 09/23/2017  . COPD (chronic obstructive pulmonary disease) ([place]) 09/23/2017  . Diabetes mellitus ([place]) 09/23/2017  . Diastolic dysfunction 09/19/1738  . Esophageal reflux 09/23/2017  . Fatigue 09/23/2017  . Gout 09/23/2017  . History of prostate cancer 09/23/2017  . Hyperlipidemia 09/23/2017  . Hypertension 09/23/2017  . PVC's (premature ventricular contractions) 09/23/2017    Past Surgical History:  Procedure Laterality Date  . ABDOMINAL AORTIC ANEURYSM REPAIR    . AORTA - ILIAC ARTERY BYPASS GRAFT    . BACK SURGERY    . CATARACT EXTRACTION, BILATERAL    . CHOLECYSTECTOMY    . HEMICOLECTOMY    . HERNIA REPAIR    . PROSTATE SURGERY    . REPLACEMENT TOTAL KNEE    . STENT PLACEMENT VASCULAR ([facility])    . TONSILLECTOMY         HPI  from the history and physical done on the day of admission:    John Maddox  is a 78 y.o. male, with past medical history of total knee replacement around 2 years ago, status post infection, tension, hyperlipidemia, [place] CHF,  COPD, tobacco abuse, abdominal aortic aneurysm with a stent placement, stage III non-small cell lung cancer followed at [facility], recent hospitalization at [facility] where he went washout by Ortho for his left knee, prolonged IV antibiotic therapy, currently following with ID about antibiotic regimen. -Patient was at ID office, been given recommendation for his prolonged antibiotic therapy, waiting for his labs to be done, he was found to be unresponsive, brought down to the floor, healthcare providers there were unable to feel his pulse, so they initiated see CPR, lasted 3 to 5 minutes, until EMS arrived, for which they found he has a pulse, became responsive, return back to baseline, alert oriented appropriate, he denies any complaints side his chronic left knee pain related to chronic infection, he denies any fever, cough, chest pain, shortness of breath, or abdominal pain. - in ED patient's vital signs were stable, CTA chest was obtained to evaluate for his syncope versus PEA arrest, which was significant for all PE, pneumonia, and known aortic aneurysm, he was started on heparin drip and  I was called to admit.    Hospital Course:   1.  Acute pulmonary embolism: Patient presents with syncope and collapse, his CTA chest significant for right lower lobe pulmonary embolism with known lung malignancy and poor ambulatory status. He will need long-term anticoagulation, but I will defer him length of treatment to his primary hematologist/oncologist at end of hospital Dr. Anabel Bene. Venous Doppler revealed DVT of the right femoral vein, right popliteal vein, and right posterior tibial vein. He was started on heparin GTT, and was able to be transitioned to Eliquis in on hospital day 2.  Patient had been evaluated by physical therapy services, but ultimately did not want any more home health physical therapy.  2D echo was obtained, but was unable to have echocardiogram interpreted for a  final read prior to discharge despite calling the on-call cardiologist.  No significant right heart strain initially noted on CT angiogram.  Patient was ready to be discharged home, and notified him that his echo results were significantly abnormal that he may be advised as needed to return to the hospital.  Syncope versus PEA arrest: Description per EMS, apparently this was syncope, patient denies any chest pain or shortness of breath, this is syncope most likely related to his pulmonary embolism. Troponins remained negative.  Aspiration pneumonia: Afebrile, with no hypoxia or dyspnea, CT chest significant for right lung pneumonia thought likely an aspiration pneumonia sustained during his syncope event.  Patient had initially been started on Unasyn, but this was discontinued as he had adequate coverage with antibiotics currently on for a prosthetic knee joint infection.  History of left knee infection status post incision and debridement and retention of hardware, culture negative. Per ID recommendation to use Keflex and doxycycline for 4-1/74-month I will continue during hospital stay.  Lung Cancer/right upper lung mass: Currently been followed by Dr. Anabel Bene oncology at [facility], last chemo was last December, and plan for immunotherapy.  Advised continue outpatient follow-up with Dr. Anabel Bene.  Anemia of chronic disease: Hemoglobin 9.9-> 9.2 during hospital stay.  Per records on care everywhere globin has been anywhere 7-9 g/dL.  Debility: Reports he is wheelchair dependent for last 88-month.  PT was consulted, but ultimately patient declined any further home health physical therapy services.  Gout: Continued home meds.  Chronic systolic and diastolic systolic CHF: Patient currently appears euvolemic.  Reviewing records from care everywhere, apparently he had low EF in the past, but this has been normalized with medication. continue with Coreg and Entresto.  COPD: Stable.  No  active wheezing, and continued with home meds.  Hypertension: Continued current meds.  GERD: Continued with PPI  Hypokalemia: Potassium noted to be mildly low at 3.4.  He did not receive replacement during his hospital stay.  Follow UP     Consults obtained None  Discharge Condition: Stable  Diet and Activity recommendation: See Discharge Instructions below  Discharge Instructions    Diet - low sodium heart healthy   Complete by:  As directed    Discharge instructions   Complete by:  As directed    You will need to be on anticoagulation of Eliquis for at least 3 to 6 months, but most likely indefinitely due to risk factors.  Please return to the hospital if you start to have any black stools or bright red bleeding as this could be life-threatening.  It is not recommend that you take aspirin while on Eliquis.  Restart antibiotics of doxycycline and cephalexin.  While on Eliquis please  always take your an acid medications of Protonix.  Plan to follow-up with your primary care provider next week to have blood work of a CBC within 5 to 7 days.  The echocardiogram has not resulted yet.  Someone will notify you if the echocardiogram is significantly abnormal.   Increase activity slowly   Complete by:  As directed         Discharge Medications     Allergies as of 03/07/2018      Reactions   Lantus [insulin Glargine]    Stomach pain   Quinolones Other (See Comments)   History of aortic aneurysm status post repair.  Use with caution      Medication List    STOP taking these medications   aspirin EC 81 MG tablet     TAKE these medications   ADVAIR HFA 115-21 MCG/ACT inhaler Generic drug:  fluticasone-salmeterol Inhale 2 puffs into the lungs 2 (two) times daily.   allopurinol 300 MG tablet Commonly known as:  ZYLOPRIM Take 300 mg by mouth daily.   amitriptyline 50 MG tablet Commonly known as:  ELAVIL Take 50 mg by mouth as needed for sleep.   apixaban 5 MG Tabs  tablet Commonly known as:  ELIQUIS Take 10 mg twice p.o. daily x7 days, then take 5 mg p.o. twice daily   carvedilol 3.125 MG tablet Commonly known as:  COREG Take 3.125 mg by mouth 2 (two) times daily.   cephALEXin 500 MG capsule Commonly known as:  KEFLEX Take 1 capsule (500 mg total) by mouth 4 (four) times daily. Needs 4.5 months total   doxycycline 100 MG tablet Commonly known as:  VIBRA-TABS Take 1 tablet (100 mg total) by mouth 2 (two) times daily.   guaiFENesin 600 MG 12 hr tablet Commonly known as:  MUCINEX Take 600 mg by mouth 2 (two) times daily.   MULTI-VITAMINS Tabs Take 1 tablet by mouth daily.   omeprazole 20 MG capsule Commonly known as:  PRILOSEC Take 20 mg by mouth daily.   oxyCODONE 5 MG immediate release tablet Commonly known as:  Oxy IR/ROXICODONE Take 5 mg by mouth 3 (three) times daily.   PROAIR HFA 108 (90 Base) MCG/ACT inhaler Generic drug:  albuterol Inhale 1 puff into the lungs every 6 (six) hours as needed.   sacubitril-valsartan 49-51 MG Commonly known as:  ENTRESTO Take 1 tablet by mouth 2 (two) times daily.   SPIRIVA HANDIHALER 18 MCG inhalation capsule Generic drug:  tiotropium Place 18 mcg into inhaler and inhale daily.       Major procedures and Radiology Reports - PLEASE review detailed and final reports for all details, in brief -   Echocardiogram 03/07/2018: Impression pending   Ct Angio Chest Pe W Or Wo Contrast  Result Date: 03/06/2018 CLINICAL DATA:  Status post CPR for cardiac arrest. Status post chemotherapy and radiation therapy for lung cancer. EXAM: CT ANGIOGRAPHY CHEST WITH CONTRAST TECHNIQUE: Multidetector CT imaging of the chest was performed using the standard protocol during bolus administration of intravenous contrast. Multiplanar CT image reconstructions and MIPs were obtained to evaluate the vascular anatomy. CONTRAST:  75 cc Isovue 370 COMPARISON:  Portable chest obtained earlier today. Chest CTA dated  01/01/2018. FINDINGS: Cardiovascular: Interval small to moderate-sized non occluding thrombus bridging two right lower lobe pulmonary arteries and extending into both arteries with opacified blood around the clot in both arteries. No other pulmonary arterial filling defects are seen. The aortic arch remains dilated with a maximum transverse diameter  of 3.9 cm on image number 31 series 5, without significant change. The distal descending thoracic aorta also remains dilated with a maximum transverse diameter of 4.3 cm on image number 74 series 5, unchanged. On sagittal image number 76 of series 9, this measures 4.9 cm in maximum diameter. The proximal abdominal aorta also remains dilated with a maximum diameter 4.6 cm on image number 105 series 5, unchanged. A prominent epicardial fat pad is again demonstrated with no pericardial fluid. The heart remains borderline enlarged. Atheromatous calcifications, including the coronary arteries and aorta. Mediastinum/Nodes: The previously demonstrated 1.3 cm short axis right hilar node currently has a short axis diameter of 1.4 cm on image number 45 series 5. Additional smaller enlarged right hilar nodes have not changed significantly. Unremarkable thyroid gland and esophagus. Lungs/Pleura: Interval small right pleural effusion and interval patchy opacity in the right lower lobe as well as interval patchy and confluent opacity in the right upper lobe. These changes are partially obscuring the previously demonstrated 4.0 x 2.8 cm spiculated mass in the posterior right upper lobe. This currently measures approximately 3.8 x 2.9 cm in corresponding dimensions on image number 43 series 7. Previously demonstrated mild changes of centrilobular emphysema. Upper Abdomen: Cholecystectomy clips. Musculoskeletal: Thoracic and lower cervical spine degenerative changes. Review of the MIP images confirms the above findings. IMPRESSION: 1. Small to moderate-sized non occluding right lower  lobe pulmonary embolism. 2. Extensive right lung probable pneumonia with an associated small right pleural effusion. 3. No gross change in the previously demonstrated right upper lobe mass compatible with the patient's known lung cancer. 4. Stable probable metastatic right hilar adenopathy. 5. Stable aneurysmal dilatation of the aortic arch, distal descending thoracic aorta and proximal abdominal aorta. Recommend followup by abdomen and pelvis CTA in 6 months, and vascular surgery referral/consultation if not already obtained. This recommendation follows ACR consensus guidelines: White Paper of the ACR Incidental Findings Committee II on Vascular Findings. J Am Coll Radiol 2013; 10:789-794. Aortic aneurysm NOS (ICD10-I71.9) 6. Stable mild changes of centrilobular emphysema. 7.  Calcific coronary artery and aortic atherosclerosis. Critical Value/emergent results were called by telephone at the time of interpretation on 03/06/2018 at 2:18 pm to Dr. Gerlene Fee , who verbally acknowledged these results. Aortic Atherosclerosis (ICD10-I70.0) and Emphysema (ICD10-J43.9). Electronically Signed   By: Claudie Revering M.D.   On: 03/06/2018 14:31   Dg Chest Port 1 View  Result Date: 03/06/2018 CLINICAL DATA:  Syncopal episode today.  History of lung cancer. EXAM: PORTABLE CHEST 1 VIEW COMPARISON:  Single-view of the chest 01/31/2018. CT chest 01/01/2018. FINDINGS: Port-A-Cath remains in place. The left lung is clear. There is airspace disease throughout the right lung, worst in the mid and upper lung zones. Aeration is improved compared to the most recent examination. There is volume loss in the right chest. No pneumothorax or pleural effusion. Right upper lobe mass lesion seen on prior CT scan is identified. Heart size is normal. No acute bony abnormality. IMPRESSION: Airspace disease in the right chest is most notable in the mid and upper lung zones and appears improved compared to the most recent examination compatible  with resolving pneumonia. Right upper lobe mass lesion is noted but better seen on prior CT. Emphysema. Electronically Signed   By: Inge Rise M.D.   On: 03/06/2018 12:49   Vas [place] Lower Extremity Venous (dvt)  Result Date: 03/07/2018  Lower Venous Study Indications: Edema, SOB, and pulmonary embolism.  Risk Factors: Confirmed PE. Performing Technologist: [facility]  [facility]  Examination Guidelines: A complete evaluation includes B-mode imaging, spectral Doppler, color Doppler, and power Doppler as needed of all accessible portions of each vessel. Bilateral testing is considered an integral part of a complete examination. Limited examinations for reoccurring indications may be performed as noted.  Right Venous Findings: +---------+---------------+---------+-----------+----------+-------------------+          CompressibilityPhasicitySpontaneityPropertiesSummary             +---------+---------------+---------+-----------+----------+-------------------+ CFV      Full           Yes      Yes                                      +---------+---------------+---------+-----------+----------+-------------------+ SFJ      Full                                                             +---------+---------------+---------+-----------+----------+-------------------+ FV Prox  Full           Yes      Yes                                      +---------+---------------+---------+-----------+----------+-------------------+ FV Mid   Full                                                             +---------+---------------+---------+-----------+----------+-------------------+ FV DistalPartial        Yes      Yes                  Acute               +---------+---------------+---------+-----------+----------+-------------------+ PFV      Full           Yes      Yes                                       +---------+---------------+---------+-----------+----------+-------------------+ POP      Partial        Yes      Yes                  Acute               +---------+---------------+---------+-----------+----------+-------------------+ PTV      Partial                                      Acute mid calf to  knee                +---------+---------------+---------+-----------+----------+-------------------+ PERO                                                  Unable to visualize                                                       well enough to                                                            fully evaluate      +---------+---------------+---------+-----------+----------+-------------------+  Left Venous Findings: +---------+---------------+---------+-----------+----------+-------------------+          CompressibilityPhasicitySpontaneityPropertiesSummary             +---------+---------------+---------+-----------+----------+-------------------+ CFV      Full           Yes      Yes                                      +---------+---------------+---------+-----------+----------+-------------------+ SFJ      Full                                                             +---------+---------------+---------+-----------+----------+-------------------+ FV Prox  Full           Yes      Yes                                      +---------+---------------+---------+-----------+----------+-------------------+ FV Mid   Full                                                             +---------+---------------+---------+-----------+----------+-------------------+ FV DistalFull           Yes      Yes                                      +---------+---------------+---------+-----------+----------+-------------------+ PFV      Full           Yes      Yes                                       +---------+---------------+---------+-----------+----------+-------------------+ POP  Full           Yes      Yes                                      +---------+---------------+---------+-----------+----------+-------------------+ PTV      Full                                                             +---------+---------------+---------+-----------+----------+-------------------+ PERO                                                  Unable to visualize                                                       well enough to                                                            fully evaluate      +---------+---------------+---------+-----------+----------+-------------------+    Summary: Right: Findings consistent with acute deep vein thrombosis involving the right femoral vein, right popliteal vein, and right posterior tibial vein. No cystic structure found in the popliteal fossa. Left: There is no evidence of deep vein thrombosis in the lower extremity. No cystic structure found in the popliteal fossa.  *See table(s) above for measurements and observations. Electronically signed by Deitra Mayo MD on 03/07/2018 at 2:39:16 PM.    Final     Micro Results     No results found for this or any previous visit (from the past 240 hour(s)).     Today   Subjective    Agamjot Kilgallon today has no reports of bleeding or chest pain.  He has oxygen available to him at home. Ultimately he with like to go home and does not want any physical therapy as he just got out of 3 weeks of it.    Objective   Blood pressure 108/78, pulse 89, temperature (!) 97.5 F (36.4 C), temperature source Oral, resp. rate (!) 21, height 5\' 10"  (1.778 m), weight 103.5 kg, SpO2 97 %.    Exam  Constitutional: Chronically ill-appearing male currently in NAD, calm, comfortable Eyes: PERRL, lids and conjunctivae normal ENMT: Mucous membranes are moist.  Posterior pharynx clear of any exudate or lesions.  Neck: normal, supple, no masses, no thyromegaly Respiratory: Decreased overall air movement. Normal respiratory effort. No accessory muscle use.  Patient on 2 L nasal cannula oxygen. Cardiovascular: Regular rate and rhythm, no murmurs / rubs / gallops. No extremity edema. 2+ pedal pulses. No carotid bruits.  Abdomen: no tenderness, no masses palpated. No hepatosplenomegaly. Bowel sounds positive.  Musculoskeletal: no clubbing / cyanosis. No joint deformity upper and lower extremities. Good  ROM, no contractures. Normal muscle tone.  Skin: no rashes, lesions, ulcers. No induration Neurologic: CN 2-12 grossly intact. Sensation intact, DTR normal. Strength 5/5 in all 4.  Psychiatric: Normal judgment and insight. Alert and oriented x 3. Normal mood.    Data Review   CBC w Diff:  Lab Results  Component Value Date   WBC 5.3 03/07/2018   HGB 9.2 (L) 03/07/2018   HCT 30.6 (L) 03/07/2018   PLT 232 03/07/2018    CMP:  Lab Results  Component Value Date   NA 140 03/07/2018   K 3.4 (L) 03/07/2018   CL 108 03/07/2018   CO2 24 03/07/2018   BUN 6 (L) 03/07/2018   CREATININE 0.78 03/07/2018  .   Total Time in preparing paper work, data evaluation and todays exam - 35 minutes  Norval Morton M.D on 03/10/2018 at 7:30 AM  [facility]   Office  [phone]

## 2018-03-10 NOTE — Telephone Encounter (Signed)
Patient wife called to advise he is home and has been given Eliquis 5 mg and Coreg 3.125 mg while in the hospital and they were told not to take the Doxy and Keflex as it will cause his blood to become to thin. She is concerned and wants [facility] to know and possible prescribe a different antibiotic. Advised will ask the provider and the pharmacy staff to see what the interactions are and what the patient needs to do. Advised will give her a call back as soon as possible.

## 2018-03-10 NOTE — Telephone Encounter (Signed)
In the discharge summary they did not mention to stop taking it.  I am not sure who did, but it should be fine, correct Cassie?

## 2018-03-11 NOTE — Telephone Encounter (Signed)
Correct - I do not see any drug interactions between doxy, keflex, eliquis, amitriptyline, or coreg. He is good to go.

## 2018-03-12 NOTE — Telephone Encounter (Signed)
Patient called office to follow-up on message left on Monday. Informed patient that Dr. Linus Salmons and Rubin Payor, Pharmacist did not see any drug interaction between medication and that patient could take his medication. Patient's wife states she will go ahead and give her husband medication as prescribed. [place]

## 2018-03-19 DIAGNOSIS — N189 Chronic kidney disease, unspecified: Secondary | ICD-10-CM | POA: Diagnosis not present

## 2018-03-19 DIAGNOSIS — D638 Anemia in other chronic diseases classified elsewhere: Secondary | ICD-10-CM | POA: Diagnosis not present

## 2018-03-19 DIAGNOSIS — J449 Chronic obstructive pulmonary disease, unspecified: Secondary | ICD-10-CM | POA: Diagnosis not present

## 2018-03-19 DIAGNOSIS — C3411 Malignant neoplasm of upper lobe, right bronchus or lung: Secondary | ICD-10-CM | POA: Diagnosis not present

## 2018-03-19 DIAGNOSIS — Z86711 Personal history of pulmonary embolism: Secondary | ICD-10-CM | POA: Diagnosis not present

## 2018-03-19 DIAGNOSIS — Z8546 Personal history of malignant neoplasm of prostate: Secondary | ICD-10-CM | POA: Diagnosis not present

## 2018-04-04 DIAGNOSIS — Z7901 Long term (current) use of anticoagulants: Secondary | ICD-10-CM | POA: Diagnosis not present

## 2018-04-04 DIAGNOSIS — Z86718 Personal history of other venous thrombosis and embolism: Secondary | ICD-10-CM | POA: Diagnosis not present

## 2018-04-04 DIAGNOSIS — N189 Chronic kidney disease, unspecified: Secondary | ICD-10-CM | POA: Diagnosis not present

## 2018-04-04 DIAGNOSIS — C3411 Malignant neoplasm of upper lobe, right bronchus or lung: Secondary | ICD-10-CM | POA: Diagnosis not present

## 2018-04-04 DIAGNOSIS — I509 Heart failure, unspecified: Secondary | ICD-10-CM | POA: Diagnosis not present

## 2018-04-09 DIAGNOSIS — Z5111 Encounter for antineoplastic chemotherapy: Secondary | ICD-10-CM | POA: Diagnosis not present

## 2018-04-09 DIAGNOSIS — C3411 Malignant neoplasm of upper lobe, right bronchus or lung: Secondary | ICD-10-CM | POA: Diagnosis not present

## 2018-04-23 DIAGNOSIS — C3411 Malignant neoplasm of upper lobe, right bronchus or lung: Secondary | ICD-10-CM | POA: Diagnosis not present

## 2018-04-23 DIAGNOSIS — Z79899 Other long term (current) drug therapy: Secondary | ICD-10-CM | POA: Diagnosis not present

## 2018-04-26 LAB — ECHOCARDIOGRAM COMPLETE
Height: 70 in
Weight: 3650.82 oz

## 2018-05-08 DIAGNOSIS — C61 Malignant neoplasm of prostate: Secondary | ICD-10-CM | POA: Diagnosis not present

## 2018-05-08 DIAGNOSIS — C3411 Malignant neoplasm of upper lobe, right bronchus or lung: Secondary | ICD-10-CM | POA: Diagnosis not present

## 2018-05-08 DIAGNOSIS — Z79899 Other long term (current) drug therapy: Secondary | ICD-10-CM | POA: Diagnosis not present

## 2018-05-21 DIAGNOSIS — C3411 Malignant neoplasm of upper lobe, right bronchus or lung: Secondary | ICD-10-CM | POA: Diagnosis not present

## 2018-05-21 DIAGNOSIS — C61 Malignant neoplasm of prostate: Secondary | ICD-10-CM | POA: Diagnosis not present

## 2018-05-21 DIAGNOSIS — Z79899 Other long term (current) drug therapy: Secondary | ICD-10-CM | POA: Diagnosis not present

## 2018-06-02 ENCOUNTER — Telehealth: Payer: Self-pay | Admitting: Cardiology

## 2018-06-02 NOTE — Telephone Encounter (Signed)
Virtual Visit Pre-Appointment Phone Call  "(Name), I am calling you today to discuss your upcoming appointment. We are currently trying to limit exposure to the virus that causes COVID-19 by seeing patients at home rather than in the office."  1. "What is the BEST phone number to call the day of the visit?" - include this in appointment notes  2. Do you have or have access to (through a family member/friend) a smartphone with video capability that we can use for your visit?" a. If yes - list this number in appt notes as cell (if different from BEST phone #) and list the appointment type as a VIDEO visit in appointment notes b. If no - list the appointment type as a PHONE visit in appointment notes  3. Confirm consent - "In the setting of the current Covid19 crisis, you are scheduled for a (phone or video) visit with your provider on (date) at (time).  Just as we do with many in-office visits, in order for you to participate in this visit, we must obtain consent.  If you'd like, I can send this to your mychart (if signed up) or email for you to review.  Otherwise, I can obtain your verbal consent now.  All virtual visits are billed to your insurance company just like a normal visit would be.  By agreeing to a virtual visit, we'd like you to understand that the technology does not allow for your provider to perform an examination, and thus may limit your provider's ability to fully assess your condition. If your provider identifies any concerns that need to be evaluated in person, we will make arrangements to do so.  Finally, though the technology is pretty good, we cannot assure that it will always work on either your or our end, and in the setting of a video visit, we may have to convert it to a phone-only visit.  In either situation, we cannot ensure that we have a secure connection.  Are you willing to proceed?" STAFF: Did the patient verbally acknowledge consent to telehealth visit? Document  YES/NO here: Yes  4. Advise patient to be prepared - "Two hours prior to your appointment, go ahead and check your blood pressure, pulse, oxygen saturation, and your weight (if you have the equipment to check those) and write them all down. When your visit starts, your provider will ask you for this information. If you have an Apple Watch or Kardia device, please plan to have heart rate information ready on the day of your appointment. Please have a pen and paper handy nearby the day of the visit as well."  5. Give patient instructions for MyChart download to smartphone OR Doximity/Doxy.me as below if video visit (depending on what platform provider is using)  6. Inform patient they will receive a phone call 15 minutes prior to their appointment time (may be from unknown caller ID) so they should be prepared to answer    TELEPHONE CALL NOTE  John Maddox has been deemed a candidate for a follow-up tele-health visit to limit community exposure during the Covid-19 pandemic. I spoke with the patient via phone to ensure availability of phone/video source, confirm preferred email & phone number, and discuss instructions and expectations.  I reminded John Maddox to be prepared with any vital sign and/or heart rhythm information that could potentially be obtained via home monitoring, at the time of his visit. I reminded John Maddox to expect a phone call prior to his visit.  Calla Kicks 06/02/2018 11:04 AM   INSTRUCTIONS FOR DOWNLOADING THE MYCHART APP TO SMARTPHONE  - The patient must first make sure to have activated MyChart and know their login information - If Apple, go to [employer] and type in MyChart in the search bar and download the app. If Android, ask patient to go to [employer] and type in Daisy in the search bar and download the app. The app is free but as with any other app downloads, their phone may require them to verify saved payment information or  Apple/Android password.  - The patient will need to then log into the app with their MyChart username and password, and select [facility] as their healthcare provider to link the account. When it is time for your visit, go to the MyChart app, find appointments, and click Begin Video Visit. Be sure to Select Allow for your device to access the Microphone and Camera for your visit. You will then be connected, and your provider will be with you shortly.  **If they have any issues connecting, or need assistance please contact MyChart service desk [phone])**  **If using a computer, in order to ensure the best quality for their visit they will need to use either of the following Internet Browsers: [employer], or Google Chrome**  IF USING DOXIMITY or DOXY.ME - The patient will receive a link just prior to their visit by text.     FULL LENGTH CONSENT FOR TELE-HEALTH VISIT   I hereby voluntarily request, consent and authorize [facility] and its employed or contracted physicians, physician assistants, nurse practitioners or other licensed health care professionals (the Practitioner), to provide me with telemedicine health care services (the Services") as deemed necessary by the treating Practitioner. I acknowledge and consent to receive the Services by the Practitioner via telemedicine. I understand that the telemedicine visit will involve communicating with the Practitioner through live audiovisual communication technology and the disclosure of certain medical information by electronic transmission. I acknowledge that I have been given the opportunity to request an in-person assessment or other available alternative prior to the telemedicine visit and am voluntarily participating in the telemedicine visit.  I understand that I have the right to withhold or withdraw my consent to the use of telemedicine in the course of my care at any time, without affecting my right to future care  or treatment, and that the Practitioner or I may terminate the telemedicine visit at any time. I understand that I have the right to inspect all information obtained and/or recorded in the course of the telemedicine visit and may receive copies of available information for a reasonable fee.  I understand that some of the potential risks of receiving the Services via telemedicine include:   Delay or interruption in medical evaluation due to technological equipment failure or disruption;  Information transmitted may not be sufficient (e.g. poor resolution of images) to allow for appropriate medical decision making by the Practitioner; and/or   In rare instances, security protocols could fail, causing a breach of personal health information.  Furthermore, I acknowledge that it is my responsibility to provide information about my medical history, conditions and care that is complete and accurate to the best of my ability. I acknowledge that Practitioner's advice, recommendations, and/or decision may be based on factors not within their control, such as incomplete or inaccurate data provided by me or distortions of diagnostic images or specimens that may result from electronic transmissions. I understand that the  practice of medicine is not an Chief Strategy Officer and that Practitioner makes no warranties or guarantees regarding treatment outcomes. I acknowledge that I will receive a copy of this consent concurrently upon execution via email to the email address I last provided but may also request a printed copy by calling the office of [facility].    I understand that my insurance will be billed for this visit.   I have read or had this consent read to me.  I understand the contents of this consent, which adequately explains the benefits and risks of the Services being provided via telemedicine.   I have been provided ample opportunity to ask questions regarding this consent and the Services and have had  my questions answered to my satisfaction.  I give my informed consent for the services to be provided through the use of telemedicine in my medical care  By participating in this telemedicine visit I agree to the above.

## 2018-06-03 DIAGNOSIS — I129 Hypertensive chronic kidney disease with stage 1 through stage 4 chronic kidney disease, or unspecified chronic kidney disease: Secondary | ICD-10-CM | POA: Diagnosis not present

## 2018-06-03 DIAGNOSIS — E1122 Type 2 diabetes mellitus with diabetic chronic kidney disease: Secondary | ICD-10-CM | POA: Diagnosis not present

## 2018-06-03 DIAGNOSIS — R0602 Shortness of breath: Secondary | ICD-10-CM | POA: Diagnosis not present

## 2018-06-03 DIAGNOSIS — N189 Chronic kidney disease, unspecified: Secondary | ICD-10-CM | POA: Diagnosis not present

## 2018-06-03 DIAGNOSIS — Z20828 Contact with and (suspected) exposure to other viral communicable diseases: Secondary | ICD-10-CM | POA: Diagnosis not present

## 2018-06-03 DIAGNOSIS — C3490 Malignant neoplasm of unspecified part of unspecified bronchus or lung: Secondary | ICD-10-CM | POA: Diagnosis not present

## 2018-06-03 DIAGNOSIS — R05 Cough: Secondary | ICD-10-CM | POA: Diagnosis not present

## 2018-06-04 DIAGNOSIS — N189 Chronic kidney disease, unspecified: Secondary | ICD-10-CM | POA: Diagnosis not present

## 2018-06-04 DIAGNOSIS — C3411 Malignant neoplasm of upper lobe, right bronchus or lung: Secondary | ICD-10-CM | POA: Diagnosis not present

## 2018-06-04 DIAGNOSIS — Z79899 Other long term (current) drug therapy: Secondary | ICD-10-CM | POA: Diagnosis not present

## 2018-06-04 DIAGNOSIS — J449 Chronic obstructive pulmonary disease, unspecified: Secondary | ICD-10-CM | POA: Diagnosis not present

## 2018-06-04 DIAGNOSIS — C61 Malignant neoplasm of prostate: Secondary | ICD-10-CM | POA: Diagnosis not present

## 2018-06-04 DIAGNOSIS — Z8546 Personal history of malignant neoplasm of prostate: Secondary | ICD-10-CM | POA: Diagnosis not present

## 2018-06-05 DIAGNOSIS — Z7901 Long term (current) use of anticoagulants: Secondary | ICD-10-CM

## 2018-06-05 HISTORY — DX: Long term (current) use of anticoagulants: Z79.01

## 2018-06-05 NOTE — Progress Notes (Signed)
Office visit  Date:  06/05/2018   ID:  John Maddox, DOB 1940-06-21, MRN [id number]  Patient Location: Other:  Office Provider Location: Office  PCP:  Renaldo Reel, PA  Cardiologist:  Shirlee More, MD  Electrophysiologist:  None   Evaluation Performed:  Follow-Up Visit  Chief Complaint:  78 yo male with PMH hypertensive heart disease undergoing immunotherapy for non-small cell lung cancer who presents for chief complaint of feet and ankles swelling.  History of Present Illness:    John Maddox is a 78 y.o. male with  a hx of PVC's, hypertension, combined systolic and diastolic heart failure EF 37% 09/27/17, hyperlipidemia and peripheral arterial disease with endovascular repair abdominal aortic aneurysm 2006 and left external iliac artery stent in 2000 and COPD. His last seen by vascular surgery September 2018 and had mild bilateral carotid artery stenosis. He was last seen 11/19/17.  He has stage III non-small cell cancer of the lung and has had orthopedic surgery and was recently admitted to the hospital after a witnessed PEA arrest and was found to have  pulmonary emboli and RLE DVT.   As the echocardiogram was done the same hospitalization as 3-5 minutes of resuscitation concern that the diminised EF of 25% is refractory to that episode. Will need to repeat echocardiogram for consideration of ICD. John Maddox remains on [place]. John Maddox reports unchanged SOB likely attributed to his lung cancer for which he remains on 2L of oxygen via nasal cannula. He denies anginal symptoms. On exam his lower extremities are non-edematous. As he spends the majority of his time in a wheelchair due to septic knee replacement edema may be due to dependent position and sedentary lifestyle. He does endorse that during one of his multiple hospitalizations or office visits his Torsemide has been discontinued and asks for it to be restarted. Will restart Torsemide 20mg  daily. He reports compliance  with Eliquis started for PE/DVT and we discussed that this would likely be a life-long medication. Denies bleeding complications.   CTA chest 03/06/18:    IMPRESSION: 1. Small to moderate-sized non occluding right lower lobe pulmonary embolism. 2. Extensive right lung probable pneumonia with an associated small right pleural effusion. 3. No gross change in the previously demonstrated right upper lobe mass compatible with the patient's known lung cancer. 4. Stable probable metastatic right hilar adenopathy. 5. Stable aneurysmal dilatation of the aortic arch, distal descending thoracic aorta and proximal abdominal aorta. Recommend followup by abdomen and pelvis CTA in 6 months, and vascular surgery referral/consultation if not already obtained. This recommendation follows ACR consensus guidelines: White Paper of the ACR Incidental Findings Committee II on Vascular Findings. J Am Coll Radiol 2013; 10:789-794. Aortic aneurysm NOS (ICD10-I71.9) 6. Stable mild changes of centrilobular emphysema. 7.  Calcific coronary artery and aortic atherosclerosis.  Echo 03/07/18:    IMPRESSIONS  1. The left ventricle has severely reduced systolic function, with an ejection fraction of 20-25%. Left ventricular function is poorly visualized with poor acoustic windows despite the use of Definity left ventricular enhancement. The cavity size was  moderately dilated. There is moderately increased left ventricular wall thickness. Left ventricular diastolic Doppler parameters are indeterminate.  2. The right ventricle has normal systolic function. RV size not well visualized, appears grossly normal. There is right vetricular wall thickness was not assessed.  3. The mitral valve is abnormal. There is moderate mitral annular calcification present.  4. The inferior vena cava was dilated in size with >50% respiratory variability.   The patient  does not have symptoms concerning for COVID-19 infection (fever, chills,  cough, or new shortness of breath).   He is feeling improved and less short of breath for more than usual activities no edema chest pain palpitation or syncope.  Surprisingly he was unaware that he had a cardiac arrest PEA presumably due to his pulmonary embolism.  He is off his diuretic I asked him to resume continue his other medications including carvedilol and Entresto we will recheck an echocardiogram as his severe left ventricular dysfunction may have been in the context of cardiac arrest and resuscitation to re-gauge his ejection fraction optimize medical therapy and make a decision whether you need to consider an ICD if his ejection fraction really is this severely reduced.  He is currently anticoagulated and is followed by oncology is receiving immunotherapy for his lung cancer after XRT and radiation therapy.  No orthopnea syncope or TIA since his hospital discharge Past Medical History:  Diagnosis Date  . Arrhythmia   . Asthma 09/23/2017  . Congestive heart failure ([place]) 09/23/2017  . COPD (chronic obstructive pulmonary disease) ([place]) 09/23/2017  . Diabetes mellitus ([place]) 09/23/2017  . Diastolic dysfunction 04/17/3534  . Esophageal reflux 09/23/2017  . Fatigue 09/23/2017  . Gout 09/23/2017  . History of prostate cancer 09/23/2017  . Hyperlipidemia 09/23/2017  . Hypertension 09/23/2017  . PVC's (premature ventricular contractions) 09/23/2017   Past Surgical History:  Procedure Laterality Date  . ABDOMINAL AORTIC ANEURYSM REPAIR    . AORTA - ILIAC ARTERY BYPASS GRAFT    . BACK SURGERY    . CATARACT EXTRACTION, BILATERAL    . CHOLECYSTECTOMY    . HEMICOLECTOMY    . HERNIA REPAIR    . PROSTATE SURGERY    . REPLACEMENT TOTAL KNEE    . STENT PLACEMENT VASCULAR ([place] [facility])    . TONSILLECTOMY       No outpatient medications have been marked as taking for the 06/06/18 encounter (Appointment) with Richardo Priest, MD.     Allergies:   Lantus [insulin glargine] and Quinolones   Social History   Tobacco  Use  . Smoking status: Former Smoker    Packs/day: 2.50    Years: 40.00    Pack years: 100.00    Types: Cigarettes    Last attempt to quit: 2014    Years since quitting: 6.3  . Smokeless tobacco: Never Used  Substance Use Topics  . Alcohol use: Not Currently  . Drug use: Not Currently     Family Hx: The patient's family history includes Alcohol abuse in his father; Arthritis in his brother, father, and mother; Heart attack in his father; Heart disease in his father; Hyperlipidemia in his father; Hypertension in his father and mother; Lung cancer in his brother and mother; Prostate cancer in his father.  ROS:   Please see the history of present illness.     All other systems reviewed and are negative.   Prior CV studies:   The following studies were reviewed today:  CTA chest 03/06/18:    IMPRESSION: 1. Small to moderate-sized non occluding right lower lobe pulmonary embolism. 2. Extensive right lung probable pneumonia with an associated small right pleural effusion. 3. No gross change in the previously demonstrated right upper lobe mass compatible with the patient's known lung cancer. 4. Stable probable metastatic right hilar adenopathy. 5. Stable aneurysmal dilatation of the aortic arch, distal descending thoracic aorta and proximal abdominal aorta. Recommend followup by abdomen and pelvis CTA in 6 months, and  vascular surgery referral/consultation if not already obtained. This recommendation follows ACR consensus guidelines: White Paper of the ACR Incidental Findings Committee II on Vascular Findings. J Am Coll Radiol 2013; 10:789-794. Aortic aneurysm NOS (ICD10-I71.9) 6. Stable mild changes of centrilobular emphysema. 7.  Calcific coronary artery and aortic atherosclerosis.  Echo 03/07/18:    IMPRESSIONS  1. The left ventricle has severely reduced systolic function, with an ejection fraction of 20-25%. Left ventricular function is poorly visualized with poor acoustic  windows despite the use of Definity left ventricular enhancement. The cavity size was  moderately dilated. There is moderately increased left ventricular wall thickness. Left ventricular diastolic Doppler parameters are indeterminate.  2. The right ventricle has normal systolic function. RV size not well visualized, appears grossly normal. There is right vetricular wall thickness was not assessed.  3. The mitral valve is abnormal. There is moderate mitral annular calcification present.  4. The inferior vena cava was dilated in size with >50% respiratory variability.  Labs/Other Tests and Data Reviewed:    EKG:  No ECG reviewed.  Recent Labs: 03/07/2018: BUN 6; Creatinine, Ser 0.78; Hemoglobin 9.2; Platelets 232; Potassium 3.4; Sodium 140   Recent Lipid Panel No results found for: CHOL, TRIG, HDL, CHOLHDL, LDLCALC, LDLDIRECT  Wt Readings from Last 3 Encounters:  03/06/18 228 lb 2.8 oz (103.5 kg)  11/19/17 236 lb 6.4 oz (107.2 kg)  10/03/17 242 lb 6.4 oz (110 kg)     Objective:    Vital Signs:  There were no vitals taken for this visit.   Neuro: Alert and oriented x3. Converses appropriately.  HEENT: Head atraumatic, normocephalic. No scleral icterus or jaundice.  Integumentary: Skin dry, flaky. Quarter sized ecchymosis to L forearm.  Cardiac: S1, S2 auscultated. No murmur. No adventitious heart sounds.  Vascular: R and L radial pulses 2+ bilaterally. No JVD. RLE and LLE with only trace edema. RUE and LUE without edema. Respiratory: On 2L nasal cannula. Chest expansion symmetrical. R lung sounds clear. L lung sounds diminished. No wheeze, crackle, rale.   ASSESSMENT & PLAN:    1. Hypertensive heart disease with heart failure - Echo with EF 25% after cardiac resuscitation. Will repeat echocardiogram to assess need for ICD. Continue Entresto.  Although clinically appeared to have massive pulmonary embolism was not present on the CT scan and his ejection fraction truly is this severely  reduced and appears to have a good long-term outlook from his lung cancer he need to consider the merits of pacemaker ICD 2. Edema - Trace pedal edema on exam. Torsemide had been discontinued at some point - will reinitiate Torsemide 20mg  daily. Recommend elevation of LE as he is in wheelchair most of the day.  3. Chronic anticoagulation secondary to PE/DVT - PE likely additional etiology of SOB. Continue Eliquis. No reported bleeding.  4. Lung cancer- Likely additional etiology of SOB. Currently on immunotherapy. Managed by oncologist.   COVID-19 Education: The signs and symptoms of COVID-19 were discussed with the patient and how to seek care for testing (follow up with PCP or arrange E-visit).  The importance of social distancing was discussed today.  Time:   Today, I have spent 46minutes with the patient IN OFFICE including review of records from [facility] from his recent admission after pulseless electrical activity cardiac arrest pulmonary embolism and hospitalization without cardiology consultation.   Medication Adjustments/Labs and Tests Ordered: Current medicines are reviewed at length with the patient today.  Concerns regarding medicines are outlined above.   Tests  Ordered: No orders of the defined types were placed in this encounter.   Medication Changes: No orders of the defined types were placed in this encounter.   Disposition:  Follow up in 1 month(s)  Signed, Shirlee More, MD  06/05/2018 2:55 PM    [place]

## 2018-06-06 ENCOUNTER — Other Ambulatory Visit: Payer: Self-pay

## 2018-06-06 ENCOUNTER — Encounter: Payer: Self-pay | Admitting: Cardiology

## 2018-06-06 ENCOUNTER — Ambulatory Visit (INDEPENDENT_AMBULATORY_CARE_PROVIDER_SITE_OTHER): Payer: Medicare Other | Admitting: Cardiology

## 2018-06-06 VITALS — BP 128/70 | HR 70 | Temp 99.8°F | Ht 70.0 in | Wt 210.0 lb

## 2018-06-06 DIAGNOSIS — I2699 Other pulmonary embolism without acute cor pulmonale: Secondary | ICD-10-CM

## 2018-06-06 DIAGNOSIS — Z7901 Long term (current) use of anticoagulants: Secondary | ICD-10-CM

## 2018-06-06 DIAGNOSIS — C3490 Malignant neoplasm of unspecified part of unspecified bronchus or lung: Secondary | ICD-10-CM

## 2018-06-06 DIAGNOSIS — I5042 Chronic combined systolic (congestive) and diastolic (congestive) heart failure: Secondary | ICD-10-CM

## 2018-06-06 DIAGNOSIS — I110 Hypertensive heart disease with heart failure: Secondary | ICD-10-CM

## 2018-06-06 HISTORY — DX: Hypertensive heart disease with heart failure: I11.0

## 2018-06-06 MED ORDER — TORSEMIDE 20 MG PO TABS: 20.0000 mg | ORAL_TABLET | Freq: Every day | ORAL | 1 refills | Status: DC

## 2018-06-06 NOTE — Patient Instructions (Addendum)
Medication Instructions:  Your physician has recommended you make the following change in your medication:   START torsemide (demadex) 20 mg: Take 1 tablet daily   If you need a refill on your cardiac medications before your next appointment, please call your pharmacy.   Lab work: None  If you have labs (blood work) drawn today and your tests are completely normal, you will receive your results only by: [name] MyChart Message (if you have MyChart) OR . A paper copy in the mail If you have any lab test that is abnormal or we need to change your treatment, we will call you to review the results.  Testing/Procedures: Your physician has requested that you have an echocardiogram. Echocardiography is a painless test that uses sound waves to create images of your heart. It provides your doctor with information about the size and shape of your heart and how well your heart's chambers and valves are working. This procedure takes approximately one hour. There are no restrictions for this procedure. This will be done in the [place] office on Friday, 06/27/2018, at 9:15 am.    Follow-Up: At [facility], you and your health needs are our priority.  As part of our continuing mission to provide you with exceptional heart care, we have created designated Provider Care Teams.  These Care Teams include your primary Cardiologist (physician) and Advanced Practice Providers (APPs -  Physician Assistants and Nurse Practitioners) who all work together to provide you with the care you need, when you need it. You will need a virtual follow up appointment in 1 months: Friday, 07/04/2018, at 3:15 pm.     Torsemide tablets What is this medicine? TORSEMIDE (TORE se mide) is a diuretic. It helps you make more urine and to lose salt and excess water from your body. This medicine is used to treat high blood pressure, and edema or swelling from heart, kidney, or liver disease. This medicine may be used for other purposes;  ask your health care provider or pharmacist if you have questions. COMMON BRAND NAME(S): Demadex What should I tell my health care provider before I take this medicine? They need to know if you have any of these conditions: -abnormal blood electrolytes -diabetes -gout -heart disease -kidney disease -liver disease -small amounts of urine, or difficulty passing urine -an unusual or allergic reaction to torsemide, sulfa drugs, other medicines, foods, dyes, or preservatives -pregnant or trying to get pregnant -breast-feeding How should I use this medicine? Take this medicine by mouth with a glass of water. Follow the directions on the prescription label. You may take this medicine with or without food. If it upsets your stomach, take it with food or milk. Do not take your medicine more often than directed. Remember that you will need to pass more urine after taking this medicine. Do not take your medicine at a time of day that will cause you problems. Do not take at bedtime. Talk to your pediatrician regarding the use of this medicine in children. Special care may be needed. Overdosage: If you think you have taken too much of this medicine contact a poison control center or emergency room at once. NOTE: This medicine is only for you. Do not share this medicine with others. What if I miss a dose? If you miss a dose, take it as soon as you can. If it is almost time for your next dose, take only that dose. Do not take double or extra doses. What may interact with this medicine? -  alcohol -certain antibiotics given by injection certain heart medicines like digoxin -diuretics -lithium -medicines for diabetes -medicines for blood pressure -medicines for cholesterol like cholestyramine -medicines that relax muscles for surgery -NSAIDs, medicines for pain and inflammation, like ibuprofen or naproxen -OTC supplements like ginseng and ephedra -probenecid -steroid medicines like prednisone or  cortisone This list may not describe all possible interactions. Give your health care provider a list of all the medicines, herbs, non-prescription drugs, or dietary supplements you use. Also tell them if you smoke, drink alcohol, or use illegal drugs. Some items may interact with your medicine. What should I watch for while using this medicine? Visit your doctor or health care professional for regular checks on your progress. Check your blood pressure regularly. Ask your doctor or health care professional what your blood pressure should be, and when you should contact him or her. If you are a diabetic, check your blood sugar as directed. You may need to be on a special diet while taking this medicine. Check with your doctor. Also, ask how many glasses of fluid you need to drink a day. You must not get dehydrated. You may get drowsy or dizzy. Do not drive, use machinery, or do anything that needs mental alertness until you know how this drug affects you. Do not stand or sit up quickly, especially if you are an older patient. This reduces the risk of dizzy or fainting spells. Alcohol can make you more drowsy and dizzy. Avoid alcoholic drinks. What side effects may I notice from receiving this medicine? Side effects that you should report to your doctor or health care professional as soon as possible: -allergic reactions such as skin rash or itching, hives, swelling of the lips, mouth, tongue or throat -blood in urine or stool -dry mouth -hearing loss or ringing in the ears -irregular heartbeat -muscle pain, weakness or cramps -pain or difficulty passing urine -unusually weak or tired -vomiting or diarrhea Side effects that usually do not require medical attention (report to your doctor or health care professional if they continue or are bothersome): -dizzy or lightheaded -headache -increased thirst -passing large amounts of urine -sexual difficulties -stomach pain, upset or nausea This list  may not describe all possible side effects. Call your doctor for medical advice about side effects. You may report side effects to FDA at 1-800-FDA-1088. Where should I keep my medicine? Keep out of the reach of children. Store at room temperature between 15 and 30 degrees C (59 and 86 degrees F). Throw away any unused medicine after the expiration date. NOTE: This sheet is a summary. It may not cover all possible information. If you have questions about this medicine, talk to your doctor, pharmacist, or health care provider.  2019 Elsevier/Gold Standard (2007-09-18 11:35:45)     Echocardiogram An echocardiogram is a procedure that uses painless sound waves (ultrasound) to produce an image of the heart. Images from an echocardiogram can provide important information about:  Signs of coronary artery disease (CAD).  Aneurysm detection. An aneurysm is a weak or damaged part of an artery wall that bulges out from the normal force of blood pumping through the body.  Heart size and shape. Changes in the size or shape of the heart can be associated with certain conditions, including heart failure, aneurysm, and CAD.  Heart muscle function.  Heart valve function.  Signs of a past heart attack.  Fluid buildup around the heart.  Thickening of the heart muscle.  A tumor or infectious growth around  the heart valves. Tell a health care provider about:  Any allergies you have.  All medicines you are taking, including vitamins, herbs, eye drops, creams, and over-the-counter medicines.  Any blood disorders you have.  Any surgeries you have had.  Any medical conditions you have.  Whether you are pregnant or may be pregnant. What are the risks? Generally, this is a safe procedure. However, problems may occur, including:  Allergic reaction to dye (contrast) that may be used during the procedure. What happens before the procedure? No specific preparation is needed. You may eat and drink  normally. What happens during the procedure?   An IV tube may be inserted into one of your veins.  You may receive contrast through this tube. A contrast is an injection that improves the quality of the pictures from your heart.  A gel will be applied to your chest.  A wand-like tool (transducer) will be moved over your chest. The gel will help to transmit the sound waves from the transducer.  The sound waves will harmlessly bounce off of your heart to allow the heart images to be captured in real-time motion. The images will be recorded on a computer. The procedure may vary among health care providers and hospitals. What happens after the procedure?  You may return to your normal, everyday life, including diet, activities, and medicines, unless your health care provider tells you not to do that. Summary  An echocardiogram is a procedure that uses painless sound waves (ultrasound) to produce an image of the heart.  Images from an echocardiogram can provide important information about the size and shape of your heart, heart muscle function, heart valve function, and fluid buildup around your heart.  You do not need to do anything to prepare before this procedure. You may eat and drink normally.  After the echocardiogram is completed, you may return to your normal, everyday life, unless your health care provider tells you not to do that. This information is not intended to replace advice given to you by your health care provider. Make sure you discuss any questions you have with your health care provider. Document Released: 12/30/1999 Document Revised: 02/04/2016 Document Reviewed: 02/04/2016 Elsevier Interactive Patient Education  2019 [employer].

## 2018-06-10 ENCOUNTER — Telehealth: Payer: Self-pay | Admitting: *Deleted

## 2018-06-10 NOTE — Telephone Encounter (Signed)
YOUR CARDIOLOGY TEAM HAS ARRANGED FOR AN E-VISIT FOR YOUR APPOINTMENT - PLEASE REVIEW IMPORTANT INFORMATION BELOW SEVERAL DAYS PRIOR TO YOUR APPOINTMENT  Due to the recent COVID-19 pandemic, we are transitioning in-person office visits to tele-medicine visits in an effort to decrease unnecessary exposure to our patients, their families, and staff. These visits are billed to your insurance just like a normal visit is. We also encourage you to sign up for MyChart if you have not already done so. You will need a smartphone if possible. For patients that do not have this, we can still complete the visit using a regular telephone but do prefer a smartphone to enable video when possible. You may have a family member that lives with you that can help. If possible, we also ask that you have a blood pressure cuff and scale at home to measure your blood pressure, heart rate and weight prior to your scheduled appointment. Patients with clinical needs that need an in-person evaluation and testing will still be able to come to the office if absolutely necessary. If you have any questions, feel free to call our office.     YOUR PROVIDER WILL BE USING THE FOLLOWING PLATFORM TO COMPLETE YOUR VISIT: Staff: Please delete this text and fill in [email or web address]  . IF USING MYCHART - How to Download the MyChart App to Your SmartPhone   - If Apple, go to [employer] and type in MyChart in the search bar and download the app. If Android, ask patient to go to [employer] and type in [place] in the search bar and download the app. The app is free but as with any other app downloads, your phone may require you to verify saved payment information or Apple/Android password.  - You will need to then log into the app with your MyChart username and password, and select [facility] as your healthcare provider to link the account.  - When it is time for your visit, go to the MyChart app, find appointments, and click Begin  Video Visit. Be sure to Select Allow for your device to access the Microphone and Camera for your visit. You will then be connected, and your provider will be with you shortly.  **If you have any issues connecting or need assistance, please contact MyChart service desk [phone])**  **If using a computer, in order to ensure the best quality for your visit, you will need to use either of the following Internet Browsers: Insurance underwriter or [employer]**  . IF USING DOXIMITY or [email or web address] - The staff will give you instructions on receiving your link to join the meeting the day of your visit.      2-3 DAYS BEFORE YOUR APPOINTMENT  You will receive a telephone call from one of our [facility] team members - your caller ID may say "Unknown caller." If this is a video visit, we will walk you through how to get the video launched on your phone. We will remind you check your blood pressure, heart rate and weight prior to your scheduled appointment. If you have an Apple Watch or Kardia, please upload any pertinent ECG strips the day before or morning of your appointment to [place]. Our staff will also make sure you have reviewed the consent and agree to move forward with your scheduled tele-health visit.     THE DAY OF YOUR APPOINTMENT  Approximately 15 minutes prior to your scheduled appointment, you will receive a telephone call from one of [place] team -  your caller ID may say "Unknown caller."  Our staff will confirm medications, vital signs for the day and any symptoms you may be experiencing. Please have this information available prior to the time of visit start. It may also be helpful for you to have a pad of paper and pen handy for any instructions given during your visit. They will also walk you through joining the smartphone meeting if this is a video visit.    CONSENT FOR TELE-HEALTH VISIT - PLEASE REVIEW  I hereby voluntarily request, consent and authorize [facility] and  its employed or contracted physicians, physician assistants, nurse practitioners or other licensed health care professionals (the Practitioner), to provide me with telemedicine health care services (the "Services") as deemed necessary by the treating Practitioner. I acknowledge and consent to receive the Services by the Practitioner via telemedicine. I understand that the telemedicine visit will involve communicating with the Practitioner through live audiovisual communication technology and the disclosure of certain medical information by electronic transmission. I acknowledge that I have been given the opportunity to request an in-person assessment or other available alternative prior to the telemedicine visit and am voluntarily participating in the telemedicine visit.  I understand that I have the right to withhold or withdraw my consent to the use of telemedicine in the course of my care at any time, without affecting my right to future care or treatment, and that the Practitioner or I may terminate the telemedicine visit at any time. I understand that I have the right to inspect all information obtained and/or recorded in the course of the telemedicine visit and may receive copies of available information for a reasonable fee.  I understand that some of the potential risks of receiving the Services via telemedicine include:  [name] Delay or interruption in medical evaluation due to technological equipment failure or disruption; . Information transmitted may not be sufficient (e.g. poor resolution of images) to allow for appropriate medical decision making by the Practitioner; and/or  . In rare instances, security protocols could fail, causing a breach of personal health information.  Furthermore, I acknowledge that it is my responsibility to provide information about my medical history, conditions and care that is complete and accurate to the best of my ability. I acknowledge that Practitioner's advice,  recommendations, and/or decision may be based on factors not within their control, such as incomplete or inaccurate data provided by me or distortions of diagnostic images or specimens that may result from electronic transmissions. I understand that the practice of medicine is not an exact science and that Practitioner makes no warranties or guarantees regarding treatment outcomes. I acknowledge that I will receive a copy of this consent concurrently upon execution via email to the email address I last provided but may also request a printed copy by calling the office of [place].    I understand that my insurance will be billed for this visit. Pt gives consent for virtual visit.  I have read or had this consent read to me. . I understand the contents of this consent, which adequately explains the benefits and risks of the Services being provided via telemedicine.  . I have been provided ample opportunity to ask questions regarding this consent and the Services and have had my questions answered to my satisfaction. . I give my informed consent for the services to be provided through the use of telemedicine in my medical care  By participating in this telemedicine visit I agree to the above.

## 2018-06-18 DIAGNOSIS — Z8546 Personal history of malignant neoplasm of prostate: Secondary | ICD-10-CM | POA: Diagnosis not present

## 2018-06-18 DIAGNOSIS — I252 Old myocardial infarction: Secondary | ICD-10-CM | POA: Diagnosis not present

## 2018-06-18 DIAGNOSIS — I509 Heart failure, unspecified: Secondary | ICD-10-CM | POA: Diagnosis not present

## 2018-06-18 DIAGNOSIS — N189 Chronic kidney disease, unspecified: Secondary | ICD-10-CM | POA: Diagnosis not present

## 2018-06-18 DIAGNOSIS — C3411 Malignant neoplasm of upper lobe, right bronchus or lung: Secondary | ICD-10-CM | POA: Diagnosis not present

## 2018-06-18 DIAGNOSIS — J449 Chronic obstructive pulmonary disease, unspecified: Secondary | ICD-10-CM | POA: Diagnosis not present

## 2018-06-24 ENCOUNTER — Telehealth: Payer: Self-pay | Admitting: Cardiology

## 2018-06-24 NOTE — Telephone Encounter (Signed)
Received call from [place], [place] with [facility]. They had received a refill request for Celebrex from Mr. Reasons. Concern for cardiovascular risk profile of this medication. Discussed that no alternative options were recommended. Prefer NSAID over increased dose of narcotics. 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   Shirlee More, MD

## 2018-06-27 ENCOUNTER — Other Ambulatory Visit: Payer: Self-pay

## 2018-06-27 ENCOUNTER — Ambulatory Visit (INDEPENDENT_AMBULATORY_CARE_PROVIDER_SITE_OTHER): Payer: Medicare Other

## 2018-06-27 ENCOUNTER — Telehealth: Payer: Self-pay | Admitting: *Deleted

## 2018-06-27 DIAGNOSIS — I5042 Chronic combined systolic (congestive) and diastolic (congestive) heart failure: Secondary | ICD-10-CM | POA: Diagnosis not present

## 2018-06-27 DIAGNOSIS — I110 Hypertensive heart disease with heart failure: Secondary | ICD-10-CM

## 2018-06-27 NOTE — Telephone Encounter (Signed)
-----   Message from Richardo Priest, MD sent at 06/27/2018  4:11 PM EDT ----- Normal or stable result  Ejection fraction remains reduced with some improvement we will continue his medications and plan to recheck in 3 months

## 2018-06-27 NOTE — Progress Notes (Signed)
Echocardiogram has been completed. Results can be found under CV Proc.  Darlina Sicilian RDCS

## 2018-06-27 NOTE — Telephone Encounter (Signed)
Left message for patient to return call to discuss echo results

## 2018-06-30 DIAGNOSIS — C3490 Malignant neoplasm of unspecified part of unspecified bronchus or lung: Secondary | ICD-10-CM | POA: Diagnosis not present

## 2018-06-30 DIAGNOSIS — C61 Malignant neoplasm of prostate: Secondary | ICD-10-CM | POA: Diagnosis not present

## 2018-06-30 DIAGNOSIS — C3411 Malignant neoplasm of upper lobe, right bronchus or lung: Secondary | ICD-10-CM | POA: Diagnosis not present

## 2018-07-02 DIAGNOSIS — N189 Chronic kidney disease, unspecified: Secondary | ICD-10-CM | POA: Diagnosis not present

## 2018-07-02 DIAGNOSIS — J449 Chronic obstructive pulmonary disease, unspecified: Secondary | ICD-10-CM | POA: Diagnosis not present

## 2018-07-02 DIAGNOSIS — D649 Anemia, unspecified: Secondary | ICD-10-CM | POA: Diagnosis not present

## 2018-07-02 DIAGNOSIS — Z8546 Personal history of malignant neoplasm of prostate: Secondary | ICD-10-CM | POA: Diagnosis not present

## 2018-07-02 DIAGNOSIS — C3411 Malignant neoplasm of upper lobe, right bronchus or lung: Secondary | ICD-10-CM | POA: Diagnosis not present

## 2018-07-03 DIAGNOSIS — C3411 Malignant neoplasm of upper lobe, right bronchus or lung: Secondary | ICD-10-CM | POA: Diagnosis not present

## 2018-07-03 DIAGNOSIS — R42 Dizziness and giddiness: Secondary | ICD-10-CM | POA: Diagnosis not present

## 2018-07-03 DIAGNOSIS — C61 Malignant neoplasm of prostate: Secondary | ICD-10-CM | POA: Diagnosis not present

## 2018-07-03 NOTE — Progress Notes (Signed)
Virtual Visit via Telephone Note   This visit type was conducted due to national recommendations for restrictions regarding the COVID-19 Pandemic (e.g. social distancing) in an effort to limit this patient's exposure and mitigate transmission in our community.  Due to his co-morbid illnesses, this patient is at least at moderate risk for complications without adequate follow up.  This format is felt to be most appropriate for this patient at this time.  The patient did not have access to video technology/had technical difficulties with video requiring transitioning to audio format only (telephone).  All issues noted in this document were discussed and addressed.  No physical exam could be performed with this format.  Please refer to the patient's chart for his  consent to telehealth for [facility].   Date:  07/04/2018   ID:  John Maddox, DOB [birth year], MRN [id number]  Patient Location: Home Provider Location: Office  PCP:  Renaldo Reel, PA  Cardiologist:  Shirlee More, MD  Electrophysiologist:  None   Evaluation Performed:  Follow-Up Visit  Chief Complaint:  78 yo male PMH PVC, HTN, HLD presents for 1 month follow up heart failure after echocardiogram.  History of Present Illness:    John Maddox is a 78 y.o. male with  a hx of PVC's, hypertension hyperlipidemia and peripheral arterial disease with endovascular repair abdominal aortic aneurysm 2006 and left external iliac artery stent in 2000. He was  last seen by vascular surgery September 2018 and had mild bilateral carotid artery stenosis. He was last seen 06/06/2018 . He has stage III non-small cell cancer of the lung and has had orthopedic surgery and was recently to the hospital 03/06/18-03/07/18 after a witnessed PEA arrest and was found to have  pulmonary emboli and RLE DVT. Echo 09/2017 EF 45-50% with MUGA 37% and transitioned to Entresto at that time. He has a septic knee after TKA currently on oral antibiotics.  I  reviewed his oncology note that says he has had a partial response to treatment and includes palliative care.  Echo 03/07/18 EF 20-25% during hospitalization for PEA arrest. Repeat echo 06/27/18 EF 30-35%, LV consistent with impaired relaxation.   Seen by his oncologist 07/02/18. Reports disease stable with partial response to therapies, tolerating maintenance durvalumab, decrease disease on CT imaging. Underwent MRI brain for new onset dizziness while lying down. He was referred to Dr. Gaylyn Cheers (ENT) for new onset hoarseness and dizziness when lying down.   Reports BPs have been 694W systolic. Reports no change in SOB - chronically on 2L. Sleeps in the bed with one  Denies LE edema. No bleeding complications on Eliquis. Reports intermittent dizziness when he lays down - MRI of brain ordered by oncologist without acute findings - has appointment with ENT.   The patient does not have symptoms concerning for COVID-19 infection (fever, chills, cough, or new shortness of breath).    Past Medical History:  Diagnosis Date  . Arrhythmia   . Asthma 09/23/2017  . Congestive heart failure ([place]) 09/23/2017  . COPD (chronic obstructive pulmonary disease) ([place]) 09/23/2017  . Diabetes mellitus ([place]) 09/23/2017  . Diastolic dysfunction 05/18/6268  . Esophageal reflux 09/23/2017  . Fatigue 09/23/2017  . Gout 09/23/2017  . History of prostate cancer 09/23/2017  . Hyperlipidemia 09/23/2017  . Hypertension 09/23/2017  . Lung cancer ([place])   . Pulmonary embolism ([place])   . PVC's (premature ventricular contractions) 09/23/2017   Past Surgical History:  Procedure Laterality Date  . ABDOMINAL AORTIC ANEURYSM REPAIR    .  AORTA - ILIAC ARTERY BYPASS GRAFT    . BACK SURGERY    . CATARACT EXTRACTION, BILATERAL    . CHOLECYSTECTOMY    . HEMICOLECTOMY    . HERNIA REPAIR    . KNEE SURGERY Left    replaced all the hardwear  . PROSTATE SURGERY    . REPLACEMENT TOTAL KNEE    . STENT PLACEMENT VASCULAR ([place] HX)    . TONSILLECTOMY        Current Meds  Medication Sig  . allopurinol (ZYLOPRIM) 300 MG tablet Take 300 mg by mouth daily.  [name] amitriptyline (ELAVIL) 50 MG tablet Take 50 mg by mouth as needed for sleep.   [name] apixaban (ELIQUIS) 5 MG TABS tablet Take 5 mg by mouth 2 (two) times daily.  . carvedilol (COREG) 3.125 MG tablet Take 3.125 mg by mouth 2 (two) times daily.  . celecoxib (CELEBREX) 200 MG capsule Take 200 mg by mouth 2 (two) times daily.  . cephALEXin (KEFLEX) 500 MG capsule Take 1 capsule (500 mg total) by mouth 4 (four) times daily. Needs 4.5 months total  . doxycycline (VIBRA-TABS) 100 MG tablet Take 1 tablet (100 mg total) by mouth 2 (two) times daily.  . ferrous sulfate 325 (65 FE) MG EC tablet Take 325 mg by mouth daily with breakfast.  . fluticasone-salmeterol (ADVAIR HFA) 115-21 MCG/ACT inhaler Inhale 2 puffs into the lungs 2 (two) times daily.   [name] guaiFENesin (MUCINEX) 600 MG 12 hr tablet Take 600 mg by mouth 2 (two) times daily.  . Multiple Vitamin (MULTI-VITAMINS) TABS Take 1 tablet by mouth daily.  [name] omeprazole (PRILOSEC) 20 MG capsule Take 20 mg by mouth daily.  [name] oxyCODONE (OXY IR/ROXICODONE) 5 MG immediate release tablet Take 5 mg by mouth 4 (four) times daily.   [name] PROAIR HFA 108 (90 Base) MCG/ACT inhaler Inhale 1 puff into the lungs every 6 (six) hours as needed.   . sacubitril-valsartan (ENTRESTO) 49-51 MG Take 1 tablet by mouth 2 (two) times daily.  . simvastatin (ZOCOR) 40 MG tablet Take 40 mg by mouth daily.  [name] tiotropium (SPIRIVA HANDIHALER) 18 MCG inhalation capsule Place 18 mcg into inhaler and inhale daily.   [name] torsemide (DEMADEX) 10 MG tablet Take 10 mg by mouth daily.  . [DISCONTINUED] torsemide (DEMADEX) 20 MG tablet Take 1 tablet (20 mg total) by mouth daily.     Allergies:   Lantus [insulin glargine] and Quinolones   Social History   Tobacco Use  . Smoking status: Former Smoker    Packs/day: 2.50    Years: 40.00    Pack years: 100.00    Types: Cigarettes    Quit date: 2014     Years since quitting: 6.4  . Smokeless tobacco: Never Used  Substance Use Topics  . Alcohol use: Not Currently  . Drug use: Not Currently     Family Hx: The patient's family history includes Alcohol abuse in his father; Arthritis in his brother, father, and mother; Heart attack in his father; Heart disease in his father; Hyperlipidemia in his father; Hypertension in his father and mother; Lung cancer in his brother and mother; Prostate cancer in his father.  ROS:   Please see the history of present illness.    Review of Systems  Cardiovascular: Negative for chest pain, irregular heartbeat, leg swelling, near-syncope and palpitations.  Respiratory: Positive for shortness of breath (at baselline 2L). Negative for cough and wheezing.   Neurological: Positive for dizziness (intermittently when lying down). Negative for  light-headedness and weakness.    All other systems reviewed and are negative.   Prior CV studies:   The following studies were reviewed today:  IMPRESSIONS 06/27/18  1. The left ventricle has severely reduced systolic function, with an ejection fraction of 20-25%. Left ventricular function is poorly visualized with poor acoustic windows despite the use of Definity left ventricular enhancement. The cavity size was  moderately dilated. There is moderately increased left ventricular wall thickness. Left ventricular diastolic Doppler parameters are indeterminate.  2. The right ventricle has normal systolic function. RV size not well visualized, appears grossly normal. There is right vetricular wall thickness was not assessed.  3. The mitral valve is abnormal. There is moderate mitral annular calcification present.  4. The inferior vena cava was dilated in size with >50% respiratory variability.   Labs/Other Tests and Data Reviewed:    EKG:  No ECG reviewed.  Recent Labs: 03/07/2018: BUN 6; Creatinine, Ser 0.78; Hemoglobin 9.2; Platelets 232; Potassium 3.4; Sodium 140    Recent Lipid Panel No results found for: CHOL, TRIG, HDL, CHOLHDL, LDLCALC, LDLDIRECT  Wt Readings from Last 3 Encounters:  07/04/18 208 lb (94.3 kg)  06/06/18 210 lb (95.3 kg)  03/06/18 228 lb 2.8 oz (103.5 kg)     Objective:    Vital Signs:  BP 138/84 (BP Location: Left Arm, Patient Position: Sitting)   Pulse 93   Temp (!) 97.5 F (36.4 C)   Wt 208 lb (94.3 kg)   SpO2 97%   BMI 29.84 kg/m    VITAL SIGNS:  reviewed  ASSESSMENT & PLAN:    1. Hypertensive heart disease with heart failure - BP well controlled with systolics in 530Y. Recent echo 06/27/2018 with EF improved to 30-35%. SOB at baseline - wears 2L O2 given non-small cell lung cancer. Reports edema has resolved after restarting Torsemide. Continue current diuretic.   GDMT: Delene Loll, Beta blocker  Increase Entresto to max dose 97/103mg  BID  Repeat echocardiogram in 3 months 2. Cardiomyopathy - Plan as above.  3. Non small cell lung cancer - Follows at [facility]. Seen 07/02/18 and continues on durvalumab. 4. Chronic anticoagulation - Secondary to PE and DVT diagnosed 02/2018. Denies bleeding complications. Continue Eliquis 5mg  BID.  COVID-19 Education: The signs and symptoms of COVID-19 were discussed with the patient and how to seek care for testing (follow up with PCP or arrange E-visit).  The importance of social distancing was discussed today.  Time:   Today, I have spent 25 minutes with the patient with telehealth technology discussing the above problems.     Medication Adjustments/Labs and Tests Ordered: Current medicines are reviewed at length with the patient today.  Concerns regarding medicines are outlined above.   Tests Ordered: No orders of the defined types were placed in this encounter.   Medication Changes: No orders of the defined types were placed in this encounter.   Follow Up:  In Person in 3 month(s)  Signed, Shirlee More, MD  07/04/2018 3:40 PM    [facility]

## 2018-07-04 ENCOUNTER — Other Ambulatory Visit: Payer: Self-pay

## 2018-07-04 ENCOUNTER — Encounter: Payer: Self-pay | Admitting: Cardiology

## 2018-07-04 ENCOUNTER — Telehealth (INDEPENDENT_AMBULATORY_CARE_PROVIDER_SITE_OTHER): Payer: Medicare Other | Admitting: Cardiology

## 2018-07-04 VITALS — BP 138/84 | HR 93 | Temp 97.5°F | Wt 208.0 lb

## 2018-07-04 DIAGNOSIS — I428 Other cardiomyopathies: Secondary | ICD-10-CM | POA: Diagnosis not present

## 2018-07-04 DIAGNOSIS — Z7901 Long term (current) use of anticoagulants: Secondary | ICD-10-CM

## 2018-07-04 DIAGNOSIS — C3490 Malignant neoplasm of unspecified part of unspecified bronchus or lung: Secondary | ICD-10-CM | POA: Diagnosis not present

## 2018-07-04 DIAGNOSIS — I110 Hypertensive heart disease with heart failure: Secondary | ICD-10-CM | POA: Diagnosis not present

## 2018-07-04 MED ORDER — SACUBITRIL-VALSARTAN 97-103 MG PO TABS: 1.0000 | ORAL_TABLET | Freq: Two times a day (BID) | ORAL | 0 refills | Status: DC

## 2018-07-04 MED ORDER — TORSEMIDE 10 MG PO TABS: 10.0000 mg | ORAL_TABLET | Freq: Every day | ORAL | 0 refills | Status: DC

## 2018-07-04 MED ORDER — CARVEDILOL 3.125 MG PO TABS: 3.1250 mg | ORAL_TABLET | Freq: Two times a day (BID) | ORAL | 0 refills | Status: DC

## 2018-07-04 NOTE — Patient Instructions (Addendum)
Medication Instructions:  Your physician has recommended you make the following change in your medication:   INCREASE sacubitril-valsartan (entresto) 97-103 mg: Take 1 tablet twice daily  If you need a refill on your cardiac medications before your next appointment, please call your pharmacy.   Lab work: None  If you have labs (blood work) drawn today and your tests are completely normal, you will receive your results only by: [name] MyChart Message (if you have MyChart) OR . A paper copy in the mail If you have any lab test that is abnormal or we need to change your treatment, we will call you to review the results.  Testing/Procedures: None  Follow-Up: At [facility], you and your health needs are our priority.  As part of our continuing mission to provide you with exceptional heart care, we have created designated Provider Care Teams.  These Care Teams include your primary Cardiologist (physician) and Advanced Practice Providers (APPs -  Physician Assistants and Nurse Practitioners) who all work together to provide you with the care you need, when you need it. You will need a follow up appointment in 3 months: Tuesday, 09/16/2018, at 2:40 pm in the [place] office.

## 2018-07-10 DIAGNOSIS — I10 Essential (primary) hypertension: Secondary | ICD-10-CM | POA: Diagnosis not present

## 2018-07-10 DIAGNOSIS — E785 Hyperlipidemia, unspecified: Secondary | ICD-10-CM | POA: Diagnosis not present

## 2018-07-10 DIAGNOSIS — E1169 Type 2 diabetes mellitus with other specified complication: Secondary | ICD-10-CM | POA: Diagnosis not present

## 2018-07-10 DIAGNOSIS — M109 Gout, unspecified: Secondary | ICD-10-CM | POA: Diagnosis not present

## 2018-07-15 DIAGNOSIS — Z9181 History of falling: Secondary | ICD-10-CM | POA: Diagnosis not present

## 2018-07-15 DIAGNOSIS — Z0000 Encounter for general adult medical examination without abnormal findings: Secondary | ICD-10-CM | POA: Diagnosis not present

## 2018-07-15 DIAGNOSIS — Z1331 Encounter for screening for depression: Secondary | ICD-10-CM | POA: Diagnosis not present

## 2018-07-15 DIAGNOSIS — E785 Hyperlipidemia, unspecified: Secondary | ICD-10-CM | POA: Diagnosis not present

## 2018-07-15 DIAGNOSIS — Z136 Encounter for screening for cardiovascular disorders: Secondary | ICD-10-CM | POA: Diagnosis not present

## 2018-07-15 DIAGNOSIS — Z125 Encounter for screening for malignant neoplasm of prostate: Secondary | ICD-10-CM | POA: Diagnosis not present

## 2018-07-16 DIAGNOSIS — C3411 Malignant neoplasm of upper lobe, right bronchus or lung: Secondary | ICD-10-CM | POA: Diagnosis not present

## 2018-07-16 DIAGNOSIS — C61 Malignant neoplasm of prostate: Secondary | ICD-10-CM | POA: Diagnosis not present

## 2018-07-16 DIAGNOSIS — E119 Type 2 diabetes mellitus without complications: Secondary | ICD-10-CM | POA: Diagnosis not present

## 2018-07-29 ENCOUNTER — Telehealth: Payer: Self-pay | Admitting: *Deleted

## 2018-07-29 ENCOUNTER — Other Ambulatory Visit: Payer: Self-pay | Admitting: *Deleted

## 2018-07-29 DIAGNOSIS — T8459XA Infection and inflammatory reaction due to other internal joint prosthesis, initial encounter: Secondary | ICD-10-CM

## 2018-07-29 DIAGNOSIS — Z96659 Presence of unspecified artificial knee joint: Secondary | ICD-10-CM

## 2018-07-29 NOTE — Telephone Encounter (Addendum)
Let patient an wife know that he does not need any additional medication, the labs are in the system and based on the results we will call if he needs a follow up appt. They were fine with this plan.

## 2018-07-29 NOTE — Telephone Encounter (Signed)
Just an ESR and crp.  No refill or follow up needed unless concerns with the lab.

## 2018-07-29 NOTE — Telephone Encounter (Signed)
Patient wife called to report that he is almost out of medications and he needs labs and a follow up visit. At conclusion of last visit he was taken from hospital by EMS to the hospital. Advised he goes to cancer center Thursday 07/31/18 and ask if we could place labs that can be drawn at that visit. Advised will ask provider and give her a call back once he responds.   What labs do we need? When do you want them back for a visit (can it be virtual)? Can we refill his medication?  Please advise.

## 2018-07-31 DIAGNOSIS — C61 Malignant neoplasm of prostate: Secondary | ICD-10-CM | POA: Diagnosis not present

## 2018-07-31 DIAGNOSIS — D649 Anemia, unspecified: Secondary | ICD-10-CM | POA: Diagnosis not present

## 2018-07-31 DIAGNOSIS — J449 Chronic obstructive pulmonary disease, unspecified: Secondary | ICD-10-CM | POA: Diagnosis not present

## 2018-07-31 DIAGNOSIS — Z86718 Personal history of other venous thrombosis and embolism: Secondary | ICD-10-CM | POA: Diagnosis not present

## 2018-07-31 DIAGNOSIS — Z86711 Personal history of pulmonary embolism: Secondary | ICD-10-CM | POA: Diagnosis not present

## 2018-07-31 DIAGNOSIS — C3411 Malignant neoplasm of upper lobe, right bronchus or lung: Secondary | ICD-10-CM | POA: Diagnosis not present

## 2018-08-04 DIAGNOSIS — C3491 Malignant neoplasm of unspecified part of right bronchus or lung: Secondary | ICD-10-CM | POA: Diagnosis not present

## 2018-08-04 DIAGNOSIS — J449 Chronic obstructive pulmonary disease, unspecified: Secondary | ICD-10-CM | POA: Diagnosis not present

## 2018-08-04 DIAGNOSIS — Z789 Other specified health status: Secondary | ICD-10-CM | POA: Diagnosis not present

## 2018-08-04 DIAGNOSIS — J383 Other diseases of vocal cords: Secondary | ICD-10-CM | POA: Diagnosis not present

## 2018-08-04 DIAGNOSIS — Z87891 Personal history of nicotine dependence: Secondary | ICD-10-CM | POA: Diagnosis not present

## 2018-08-04 DIAGNOSIS — J342 Deviated nasal septum: Secondary | ICD-10-CM | POA: Diagnosis not present

## 2018-08-04 DIAGNOSIS — R490 Dysphonia: Secondary | ICD-10-CM | POA: Diagnosis not present

## 2018-08-13 DIAGNOSIS — N189 Chronic kidney disease, unspecified: Secondary | ICD-10-CM | POA: Diagnosis not present

## 2018-08-13 DIAGNOSIS — I252 Old myocardial infarction: Secondary | ICD-10-CM | POA: Diagnosis not present

## 2018-08-13 DIAGNOSIS — J449 Chronic obstructive pulmonary disease, unspecified: Secondary | ICD-10-CM | POA: Diagnosis not present

## 2018-08-13 DIAGNOSIS — C3411 Malignant neoplasm of upper lobe, right bronchus or lung: Secondary | ICD-10-CM

## 2018-08-13 DIAGNOSIS — C61 Malignant neoplasm of prostate: Secondary | ICD-10-CM

## 2018-08-13 DIAGNOSIS — I509 Heart failure, unspecified: Secondary | ICD-10-CM | POA: Diagnosis not present

## 2018-08-27 DIAGNOSIS — C3411 Malignant neoplasm of upper lobe, right bronchus or lung: Secondary | ICD-10-CM | POA: Diagnosis not present

## 2018-08-27 DIAGNOSIS — Z79899 Other long term (current) drug therapy: Secondary | ICD-10-CM | POA: Diagnosis not present

## 2018-08-27 DIAGNOSIS — C61 Malignant neoplasm of prostate: Secondary | ICD-10-CM | POA: Diagnosis not present

## 2018-09-10 DIAGNOSIS — C61 Malignant neoplasm of prostate: Secondary | ICD-10-CM | POA: Diagnosis not present

## 2018-09-10 DIAGNOSIS — Z79899 Other long term (current) drug therapy: Secondary | ICD-10-CM | POA: Diagnosis not present

## 2018-09-10 DIAGNOSIS — C3411 Malignant neoplasm of upper lobe, right bronchus or lung: Secondary | ICD-10-CM | POA: Diagnosis not present

## 2018-09-15 NOTE — Progress Notes (Signed)
Cardiology Office Note:    Date:  09/16/2018   ID:  John Maddox, DOB [birth year], MRN [id number]  PCP:  Renaldo Reel, PA  Cardiologist:  Shirlee More, MD    Referring MD: Renaldo Reel, [place]    ASSESSMENT:    1. Chronic combined systolic and diastolic heart failure ([place])   2. Chronic anticoagulation   3. Hypertensive heart disease with chronic combined systolic and diastolic congestive heart failure ([place])   4. PVC's (premature ventricular contractions)    PLAN:    In order of problems listed above:  1. Stable he is on good guideline directed therapy with loop diuretic carvedilol and optimal dose Entresto.  Plan to repeat echocardiogram around the time of his next visit at this time I would not recommend an ICD 2. Continue his anticoagulant 3. Stable BP with the proper size cuff 126/82 on guideline directed therapy 4. Continue a statin   Next appointment: 3 months   Medication Adjustments/Labs and Tests Ordered: Current medicines are reviewed at length with the patient today.  Concerns regarding medicines are outlined above.  No orders of the defined types were placed in this encounter.  No orders of the defined types were placed in this encounter.   Chief Complaint  Patient presents with  . other    3 month f/u sob and knee pain. Meds reviewed verbally with pt.  . Follow-up  . Cardiomyopathy  . Congestive Heart Failure  . Hypertension    History of Present Illness:    John Maddox is a 78 y.o. male with a hx of PVC's, hypertension hyperlipidemia and peripheral arterial disease with endovascular repair abdominal aortic aneurysm 2006 and left external iliac artery stent in 2000. He was  last seen by vascular surgery September 2018 and had mild bilateral carotid artery stenosis.[name] He has stage III non-small cell cancer of the lung and has had orthopedic surgery and was recently to the hospital 03/06/18-03/07/18 after a witnessed PEA arrest and was found  to have  pulmonary emboli and RLE DVT. Echo 09/2017 EF 45-50% with MUGA 37% and transitioned to Entresto at that time. He has a septic knee after TKA currently on oral antibiotics.  I reviewed his oncology note that says he has had a partial response to treatment and includes palliative care.   Echo 03/07/18 EF 20-25% during hospitalization for PEA arrest. Repeat echo 06/27/18 EF 30-35%, LV consistent with impaired relaxation.   He was last seen 07/04/2018.  Compliance with diet, lifestyle and medications: Yes  He has had a good clinical response with immune therapy for his cancer quality of his life is good he has chronic mild shortness of breath but no edema orthopnea chest pain palpitation or syncope.  Labs are checked frequently by oncology and will access the last in the [place] health record.  His home blood pressures are consistently greater than 409 systolic and repeat by me with the proper size cuff 126/80 he tolerates his anticoagulant without bleeding complication Past Medical History:  Diagnosis Date  . Arrhythmia   . Asthma 09/23/2017  . Congestive heart failure ([place]) 09/23/2017  . COPD (chronic obstructive pulmonary disease) ([place]) 09/23/2017  . Diabetes mellitus ([place]) 09/23/2017  . Diastolic dysfunction 08/15/1912  . Esophageal reflux 09/23/2017  . Fatigue 09/23/2017  . Gout 09/23/2017  . History of prostate cancer 09/23/2017  . Hyperlipidemia 09/23/2017  . Hypertension 09/23/2017  . Lung cancer ([place])   . Pulmonary embolism ([place])   . PVC's (premature  ventricular contractions) 09/23/2017    Past Surgical History:  Procedure Laterality Date  . ABDOMINAL AORTIC ANEURYSM REPAIR    . AORTA - ILIAC ARTERY BYPASS GRAFT    . BACK SURGERY    . CATARACT EXTRACTION, BILATERAL    . CHOLECYSTECTOMY    . HEMICOLECTOMY    . HERNIA REPAIR    . KNEE SURGERY Left    replaced all the hardwear  . PROSTATE SURGERY    . REPLACEMENT TOTAL KNEE    . STENT PLACEMENT VASCULAR ([place] HX)    . TONSILLECTOMY       Current Medications: Current Meds  Medication Sig  . allopurinol (ZYLOPRIM) 300 MG tablet Take 300 mg by mouth daily.  [name] amitriptyline (ELAVIL) 50 MG tablet Take 50 mg by mouth as needed for sleep.   [name] apixaban (ELIQUIS) 5 MG TABS tablet Take 5 mg by mouth 2 (two) times daily.  . carvedilol (COREG) 3.125 MG tablet Take 1 tablet (3.125 mg total) by mouth 2 (two) times daily.  . celecoxib (CELEBREX) 200 MG capsule Take 200 mg by mouth 2 (two) times daily.  . fluticasone-salmeterol (ADVAIR HFA) 115-21 MCG/ACT inhaler Inhale 2 puffs into the lungs 2 (two) times daily.   . Multiple Vitamin (MULTI-VITAMINS) TABS Take 1 tablet by mouth daily.  [name] omeprazole (PRILOSEC) 20 MG capsule Take 20 mg by mouth daily.  [name] oxyCODONE (OXY IR/ROXICODONE) 5 MG immediate release tablet Take 5 mg by mouth 4 (four) times daily.   [name] PROAIR HFA 108 (90 Base) MCG/ACT inhaler Inhale 1 puff into the lungs every 6 (six) hours as needed.   . sacubitril-valsartan (ENTRESTO) 97-103 MG Take 1 tablet by mouth 2 (two) times daily.  . simvastatin (ZOCOR) 40 MG tablet Take 40 mg by mouth daily.  [name] tiotropium (SPIRIVA HANDIHALER) 18 MCG inhalation capsule Place 18 mcg into inhaler and inhale daily.   [name] torsemide (DEMADEX) 20 MG tablet Take 20 mg by mouth daily.     Allergies:   Lantus [insulin glargine] and Quinolones   Social History   Socioeconomic History  . Marital status: Married    Spouse name: Not on file  . Number of children: Not on file  . Years of education: Not on file  . Highest education level: Not on file  Occupational History  . Not on file  Social Needs  . Financial resource strain: Not on file  . Food insecurity    Worry: Not on file    Inability: Not on file  . Transportation needs    Medical: Not on file    Non-medical: Not on file  Tobacco Use  . Smoking status: Former Smoker    Packs/day: 2.50    Years: 40.00    Pack years: 100.00    Types: Cigarettes    Quit date: 2014    Years since  quitting: 6.6  . Smokeless tobacco: Never Used  Substance and Sexual Activity  . Alcohol use: Not Currently  . Drug use: Not Currently  . Sexual activity: Not on file  Lifestyle  . Physical activity    Days per week: Not on file    Minutes per session: Not on file  . Stress: Not on file  Relationships  . Social Herbalist on phone: Not on file    Gets together: Not on file    Attends religious service: Not on file    Active member of club or organization: Not on file  Attends meetings of clubs or organizations: Not on file    Relationship status: Not on file  Other Topics Concern  . Not on file  Social History Narrative  . Not on file     Family History: The patient's family history includes Alcohol abuse in his father; Arthritis in his brother, father, and mother; Heart attack in his father; Heart disease in his father; Hyperlipidemia in his father; Hypertension in his father and mother; Lung cancer in his brother and mother; Prostate cancer in his father. ROS:   Please see the history of present illness.    All other systems reviewed and are negative.  EKGs/Labs/Other Studies Reviewed:    The following studies were reviewed today  Recent Labs: 09/10/2018: CBC with a hemoglobin of 12.3 platelets 156,000 CMP with a potassium 4.6 creatinine 1.30 GFR 54 cc/min stage II CKD  Physical Exam:    VS:  BP 92/70 (BP Location: Right Arm, Patient Position: Sitting, Cuff Size: Large)   Pulse (!) 103   Ht 5\' 10"  (1.778 m)   Wt 227 lb 8 oz (103.2 kg)   SpO2 98%   BMI 32.64 kg/m     Wt Readings from Last 3 Encounters:  09/16/18 227 lb 8 oz (103.2 kg)  07/04/18 208 lb (94.3 kg)  06/06/18 210 lb (95.3 kg)     GEN: He looks chronically ill wheelchair-bound  in no acute distress HEENT: Normal NECK: No JVD; No carotid bruits LYMPHATICS: No lymphadenopathy CARDIAC: Distant heart sounds RRR, no murmurs, rubs, gallops RESPIRATORY: Diminished breath sounds expiratory  wheezing ABDOMEN: Soft, non-tender, non-distended MUSCULOSKELETAL:  No edema; No deformity  SKIN: Warm and dry NEUROLOGIC:  Alert and oriented x 3 PSYCHIATRIC:  Normal affect    Signed, Shirlee More, MD  09/16/2018 3:03 PM    [facility]

## 2018-09-16 ENCOUNTER — Encounter: Payer: Self-pay | Admitting: Cardiology

## 2018-09-16 ENCOUNTER — Ambulatory Visit (INDEPENDENT_AMBULATORY_CARE_PROVIDER_SITE_OTHER): Payer: Medicare Other | Admitting: Cardiology

## 2018-09-16 ENCOUNTER — Other Ambulatory Visit: Payer: Self-pay

## 2018-09-16 VITALS — BP 92/70 | HR 103 | Ht 70.0 in | Wt 227.5 lb

## 2018-09-16 DIAGNOSIS — Z7901 Long term (current) use of anticoagulants: Secondary | ICD-10-CM | POA: Diagnosis not present

## 2018-09-16 DIAGNOSIS — I5042 Chronic combined systolic (congestive) and diastolic (congestive) heart failure: Secondary | ICD-10-CM | POA: Diagnosis not present

## 2018-09-16 DIAGNOSIS — I110 Hypertensive heart disease with heart failure: Secondary | ICD-10-CM

## 2018-09-16 DIAGNOSIS — I493 Ventricular premature depolarization: Secondary | ICD-10-CM

## 2018-09-16 NOTE — Patient Instructions (Signed)

## 2018-09-17 ENCOUNTER — Other Ambulatory Visit: Payer: Self-pay | Admitting: Cardiology

## 2018-09-24 DIAGNOSIS — D649 Anemia, unspecified: Secondary | ICD-10-CM | POA: Diagnosis not present

## 2018-09-24 DIAGNOSIS — Z86711 Personal history of pulmonary embolism: Secondary | ICD-10-CM | POA: Diagnosis not present

## 2018-09-24 DIAGNOSIS — R970 Elevated carcinoembryonic antigen [CEA]: Secondary | ICD-10-CM | POA: Diagnosis not present

## 2018-09-24 DIAGNOSIS — Z86718 Personal history of other venous thrombosis and embolism: Secondary | ICD-10-CM | POA: Diagnosis not present

## 2018-09-24 DIAGNOSIS — Z7901 Long term (current) use of anticoagulants: Secondary | ICD-10-CM | POA: Diagnosis not present

## 2018-09-24 DIAGNOSIS — C3411 Malignant neoplasm of upper lobe, right bronchus or lung: Secondary | ICD-10-CM | POA: Diagnosis not present

## 2018-09-24 DIAGNOSIS — J449 Chronic obstructive pulmonary disease, unspecified: Secondary | ICD-10-CM | POA: Diagnosis not present

## 2018-09-24 DIAGNOSIS — N189 Chronic kidney disease, unspecified: Secondary | ICD-10-CM | POA: Diagnosis not present

## 2018-10-06 DIAGNOSIS — E785 Hyperlipidemia, unspecified: Secondary | ICD-10-CM | POA: Diagnosis not present

## 2018-10-06 DIAGNOSIS — I10 Essential (primary) hypertension: Secondary | ICD-10-CM | POA: Diagnosis not present

## 2018-10-06 DIAGNOSIS — E1169 Type 2 diabetes mellitus with other specified complication: Secondary | ICD-10-CM | POA: Diagnosis not present

## 2018-10-06 DIAGNOSIS — M109 Gout, unspecified: Secondary | ICD-10-CM | POA: Diagnosis not present

## 2018-10-07 DIAGNOSIS — C3411 Malignant neoplasm of upper lobe, right bronchus or lung: Secondary | ICD-10-CM | POA: Diagnosis not present

## 2018-10-07 DIAGNOSIS — C3490 Malignant neoplasm of unspecified part of unspecified bronchus or lung: Secondary | ICD-10-CM | POA: Diagnosis not present

## 2018-10-07 DIAGNOSIS — C61 Malignant neoplasm of prostate: Secondary | ICD-10-CM | POA: Diagnosis not present

## 2018-10-08 DIAGNOSIS — C3411 Malignant neoplasm of upper lobe, right bronchus or lung: Secondary | ICD-10-CM | POA: Diagnosis not present

## 2018-10-08 DIAGNOSIS — C61 Malignant neoplasm of prostate: Secondary | ICD-10-CM | POA: Diagnosis not present

## 2018-10-21 ENCOUNTER — Other Ambulatory Visit: Payer: Self-pay | Admitting: Cardiology

## 2018-10-22 DIAGNOSIS — N189 Chronic kidney disease, unspecified: Secondary | ICD-10-CM | POA: Diagnosis not present

## 2018-10-22 DIAGNOSIS — Z86718 Personal history of other venous thrombosis and embolism: Secondary | ICD-10-CM | POA: Diagnosis not present

## 2018-10-22 DIAGNOSIS — Z23 Encounter for immunization: Secondary | ICD-10-CM | POA: Diagnosis not present

## 2018-10-22 DIAGNOSIS — C3411 Malignant neoplasm of upper lobe, right bronchus or lung: Secondary | ICD-10-CM | POA: Diagnosis not present

## 2018-10-22 DIAGNOSIS — D649 Anemia, unspecified: Secondary | ICD-10-CM | POA: Diagnosis not present

## 2018-10-22 DIAGNOSIS — M79662 Pain in left lower leg: Secondary | ICD-10-CM | POA: Diagnosis not present

## 2018-10-22 DIAGNOSIS — I719 Aortic aneurysm of unspecified site, without rupture: Secondary | ICD-10-CM | POA: Diagnosis not present

## 2018-10-22 DIAGNOSIS — G8929 Other chronic pain: Secondary | ICD-10-CM | POA: Diagnosis not present

## 2018-10-22 DIAGNOSIS — Z8674 Personal history of sudden cardiac arrest: Secondary | ICD-10-CM | POA: Diagnosis not present

## 2018-10-22 DIAGNOSIS — M79661 Pain in right lower leg: Secondary | ICD-10-CM | POA: Diagnosis not present

## 2018-10-22 DIAGNOSIS — Z8042 Family history of malignant neoplasm of prostate: Secondary | ICD-10-CM | POA: Diagnosis not present

## 2018-10-22 DIAGNOSIS — J449 Chronic obstructive pulmonary disease, unspecified: Secondary | ICD-10-CM | POA: Diagnosis not present

## 2018-10-22 DIAGNOSIS — I509 Heart failure, unspecified: Secondary | ICD-10-CM | POA: Diagnosis not present

## 2018-10-22 DIAGNOSIS — Z7901 Long term (current) use of anticoagulants: Secondary | ICD-10-CM | POA: Diagnosis not present

## 2018-10-22 DIAGNOSIS — Z515 Encounter for palliative care: Secondary | ICD-10-CM | POA: Diagnosis not present

## 2018-10-22 DIAGNOSIS — Z86711 Personal history of pulmonary embolism: Secondary | ICD-10-CM | POA: Diagnosis not present

## 2018-10-22 NOTE — Telephone Encounter (Signed)
Reviewed patient's medication list and his torsemide is 20 mg. He has previously had 20 mg and 10 mg sent in. I checked for recent labs to see if this had been changed and he didn't have anything recent. Please advise on which dose is correct.

## 2018-10-22 NOTE — Telephone Encounter (Signed)
Unfortunately the only way I know how to reconcile medications is what you are doing what ever he is taking I would continue the same and change the medication list in epic

## 2018-10-23 MED ORDER — TORSEMIDE 20 MG PO TABS: 20.0000 mg | ORAL_TABLET | Freq: Every day | ORAL | 1 refills | Status: DC

## 2018-10-23 NOTE — Telephone Encounter (Signed)
Called and spoke with patients wife (Ok per patient's DPR). She looked through his medications and stated that the patient was taking Torsemide 20 mg. A refill for Torsemide 20 mg has been sent to the patient's mail order.

## 2018-10-23 NOTE — Addendum Note (Signed)
Addended by: Aleatha Borer on: 10/23/2018 01:38 PM   Modules accepted: Orders

## 2018-11-05 DIAGNOSIS — Z79899 Other long term (current) drug therapy: Secondary | ICD-10-CM | POA: Diagnosis not present

## 2018-11-05 DIAGNOSIS — C3411 Malignant neoplasm of upper lobe, right bronchus or lung: Secondary | ICD-10-CM | POA: Diagnosis not present

## 2018-11-19 DIAGNOSIS — Z5111 Encounter for antineoplastic chemotherapy: Secondary | ICD-10-CM | POA: Diagnosis not present

## 2018-11-19 DIAGNOSIS — C3411 Malignant neoplasm of upper lobe, right bronchus or lung: Secondary | ICD-10-CM | POA: Diagnosis not present

## 2018-11-19 DIAGNOSIS — Z79899 Other long term (current) drug therapy: Secondary | ICD-10-CM | POA: Diagnosis not present

## 2018-11-28 ENCOUNTER — Other Ambulatory Visit: Payer: Self-pay | Admitting: Cardiology

## 2018-11-28 NOTE — Telephone Encounter (Signed)
Rx refill sent to pharmacy. 

## 2018-12-03 DIAGNOSIS — R946 Abnormal results of thyroid function studies: Secondary | ICD-10-CM | POA: Diagnosis not present

## 2018-12-03 DIAGNOSIS — C3411 Malignant neoplasm of upper lobe, right bronchus or lung: Secondary | ICD-10-CM | POA: Diagnosis not present

## 2018-12-16 ENCOUNTER — Other Ambulatory Visit: Payer: Self-pay | Admitting: Cardiology

## 2018-12-17 DIAGNOSIS — C3411 Malignant neoplasm of upper lobe, right bronchus or lung: Secondary | ICD-10-CM | POA: Diagnosis not present

## 2018-12-17 DIAGNOSIS — Z79899 Other long term (current) drug therapy: Secondary | ICD-10-CM | POA: Diagnosis not present

## 2018-12-17 DIAGNOSIS — Z5111 Encounter for antineoplastic chemotherapy: Secondary | ICD-10-CM | POA: Diagnosis not present

## 2018-12-22 NOTE — Progress Notes (Signed)
Cardiology Office Note:    Date:  12/23/2018   ID:  John Maddox, DOB Jan 17, 1940, MRN [id number]  PCP:  Renaldo Reel, PA  Cardiologist:  Shirlee More, MD    Referring MD: Renaldo Reel, [place]    ASSESSMENT:    1. Chronic systolic heart failure ([place])   2. Chronic anticoagulation   3. Hypertensive heart disease with heart failure ([place])   4. PVC's (premature ventricular contractions)   5. Non-small cell lung cancer, unspecified laterality ([place])    PLAN:    In order of problems listed above:  1. Stable compensated continue current treatment including high-dose Entresto recheck ejection fraction of remains less than 35% like to consider the merits of an ICD in the context of his lung cancer. 2. Continue long-term anticoagulation with pulmonary embolism and cancer 3. Stable continue guideline directed therapy diuretic beta-blocker Entresto 4. Stable asymptomatic 5. Stable managed by oncology   Next appointment: 3 months   Medication Adjustments/Labs and Tests Ordered: Current medicines are reviewed at length with the patient today.  Concerns regarding medicines are outlined above.  No orders of the defined types were placed in this encounter.  No orders of the defined types were placed in this encounter.   Chief Complaint  Patient presents with  . Follow-up  . Congestive Heart Failure    with severe   . Cardiomyopathy    History of Present Illness:    John Maddox is a 78 y.o. male with a hx of PVC's, hypertension hyperlipidemia and peripheral arterial disease with endovascular repair abdominal aortic aneurysm 2006 and left external iliac artery stent in 2000. He was  last seen by vascular surgery September 2018 and had mild bilateral carotid artery stenosis.[name] He has stage III non-small cell cancer of the lung and has had orthopedic surgery and was admitted to the hospital 03/06/18-03/07/18 after a witnessed PEA arrest and was found to have  pulmonary  emboli and RLE DVT. Echo 09/2017 EF 45-50% with MUGA 37% and transitioned to Entresto at that time. He has a septic knee after TKA currently on oral antibiotics.  I reviewed his oncology note that says he has had a partial response to treatment and includes palliative care.  He was last seen 09/16/2018. Compliance with diet, lifestyle and medications: Yes  Echo 2/21/20EF 20-25% during hospitalization for PEA arrest. Repeat echo 06/27/18 EF 30-35%,LV consistent with impaired relaxation.  Overall he is stable or continue to improve his wife thinks he is less fatigued but is still fatigued he is less short of breath but still short of breath with any activity wearing oxygen no orthopnea little or no edema chest pain palpitation or syncope.  Recent labs requested from [facility].  Her last proBNP with his next labs and recheck renal function.  He sodium restriction is compliant with medications.  Organ to recheck his ejection fraction and if remains less than 35% would need to consider the merits of an ICD depending on the prognosis with his malignancy.  He and his wife proceed there at their another state with continued improvement in remission of his cancer which is clinically quiet with immunotherapy. Past Medical History:  Diagnosis Date  . Arrhythmia   . Asthma 09/23/2017  . Congestive heart failure ([place]) 09/23/2017  . COPD (chronic obstructive pulmonary disease) ([place]) 09/23/2017  . Diabetes mellitus ([place]) 09/23/2017  . Diastolic dysfunction 07/23/2954  . Esophageal reflux 09/23/2017  . Fatigue 09/23/2017  . Gout 09/23/2017  . History of  prostate cancer 09/23/2017  . Hyperlipidemia 09/23/2017  . Hypertension 09/23/2017  . Lung cancer ([place])   . Pulmonary embolism ([place])   . PVC's (premature ventricular contractions) 09/23/2017    Past Surgical History:  Procedure Laterality Date  . ABDOMINAL AORTIC ANEURYSM REPAIR    . AORTA - ILIAC ARTERY BYPASS GRAFT    . BACK SURGERY    . CATARACT EXTRACTION,  BILATERAL    . CHOLECYSTECTOMY    . HEMICOLECTOMY    . HERNIA REPAIR    . KNEE SURGERY Left    replaced all the hardwear  . PROSTATE SURGERY    . REPLACEMENT TOTAL KNEE    . STENT PLACEMENT VASCULAR ([place] [facility])    . TONSILLECTOMY      Current Medications: Current Meds  Medication Sig  . allopurinol (ZYLOPRIM) 300 MG tablet Take 300 mg by mouth daily.  [name] amitriptyline (ELAVIL) 50 MG tablet Take 50 mg by mouth as needed for sleep.   [name] apixaban (ELIQUIS) 5 MG TABS tablet Take 5 mg by mouth 2 (two) times daily.  . carvedilol (COREG) 3.125 MG tablet TAKE 1 TABLET TWICE A DAY  . celecoxib (CELEBREX) 200 MG capsule Take 200 mg by mouth 2 (two) times daily.  [name] ENTRESTO 97-103 MG TAKE 1 TABLET TWICE A DAY  . fluticasone-salmeterol (ADVAIR HFA) 115-21 MCG/ACT inhaler Inhale 2 puffs into the lungs 2 (two) times daily.   . Multiple Vitamin (MULTI-VITAMINS) TABS Take 1 tablet by mouth daily.  [name] omeprazole (PRILOSEC) 20 MG capsule Take 20 mg by mouth daily.  [name] oxyCODONE (OXY IR/ROXICODONE) 5 MG immediate release tablet Take 5 mg by mouth 4 (four) times daily.   [name] PROAIR HFA 108 (90 Base) MCG/ACT inhaler Inhale 1 puff into the lungs every 6 (six) hours as needed.   . simvastatin (ZOCOR) 40 MG tablet Take 40 mg by mouth daily.  [name] tiotropium (SPIRIVA HANDIHALER) 18 MCG inhalation capsule Place 18 mcg into inhaler and inhale daily.   [name] torsemide (DEMADEX) 20 MG tablet Take 1 tablet (20 mg total) by mouth daily.     Allergies:   Lantus [insulin glargine] and Quinolones   Social History   Socioeconomic History  . Marital status: Married    Spouse name: Not on file  . Number of children: Not on file  . Years of education: Not on file  . Highest education level: Not on file  Occupational History  . Not on file  Social Needs  . Financial resource strain: Not on file  . Food insecurity    Worry: Not on file    Inability: Not on file  . Transportation needs    Medical: Not on file     Non-medical: Not on file  Tobacco Use  . Smoking status: Former Smoker    Packs/day: 2.50    Years: 40.00    Pack years: 100.00    Types: Cigarettes    Quit date: 2014    Years since quitting: 6.9  . Smokeless tobacco: Never Used  Substance and Sexual Activity  . Alcohol use: Not Currently  . Drug use: Not Currently  . Sexual activity: Not on file  Lifestyle  . Physical activity    Days per week: Not on file    Minutes per session: Not on file  . Stress: Not on file  Relationships  . Social Herbalist on phone: Not on file    Gets together: Not on file    Attends  religious service: Not on file    Active member of club or organization: Not on file    Attends meetings of clubs or organizations: Not on file    Relationship status: Not on file  John Topics Concern  . Not on file  Social History Narrative  . Not on file     Family History: The patient's family history includes Alcohol abuse in his father; Arthritis in his brother, father, and mother; Heart attack in his father; Heart disease in his father; Hyperlipidemia in his father; Hypertension in his father and mother; Lung cancer in his brother and mother; Prostate cancer in his father. ROS:   Please see the history of present illness.    All John systems reviewed and are negative.  EKGs/Labs/John Studies Reviewed:    The following studies were reviewed today:  Recent Labs: 03/07/2018: BUN 6; Creatinine, Ser 0.78; Hemoglobin 9.2; Platelets 232; Potassium 3.4; Sodium 140  Recent Lipid Panel No results found for: CHOL, TRIG, HDL, CHOLHDL, VLDL, LDLCALC, LDLDIRECT  Physical Exam:    VS:  BP (!) 144/80 (BP Location: Right Arm)   Pulse 87   Ht 5\' 10"  (1.778 m)   Wt 241 lb (109.3 kg)   SpO2 97%   BMI 34.58 kg/m     Wt Readings from Last 3 Encounters:  12/23/18 241 lb (109.3 kg)  09/16/18 227 lb 8 oz (103.2 kg)  07/04/18 208 lb (94.3 kg)     GEN: He appears chronically ill but less debilitated  than previous visits well nourished, well developed in no acute distress HEENT: Normal NECK: No JVD; No carotid bruits LYMPHATICS: No lymphadenopathy CARDIAC: RRR, no murmurs, rubs, gallops RESPIRATORY:  Clear to auscultation without rales, wheezing or rhonchi  ABDOMEN: Soft, non-tender, non-distended MUSCULOSKELETAL: Minimal 1+ pitting bilateral lower extremity edema; No deformity  SKIN: Warm and dry NEUROLOGIC:  Alert and oriented x 3 PSYCHIATRIC:  Normal affect    Signed, Shirlee More, MD  12/23/2018 2:40 PM    [facility]

## 2018-12-23 ENCOUNTER — Other Ambulatory Visit: Payer: Self-pay

## 2018-12-23 ENCOUNTER — Ambulatory Visit (INDEPENDENT_AMBULATORY_CARE_PROVIDER_SITE_OTHER): Payer: Medicare Other | Admitting: Cardiology

## 2018-12-23 ENCOUNTER — Encounter: Payer: Self-pay | Admitting: Cardiology

## 2018-12-23 VITALS — BP 144/80 | HR 87 | Ht 70.0 in | Wt 241.0 lb

## 2018-12-23 DIAGNOSIS — C3490 Malignant neoplasm of unspecified part of unspecified bronchus or lung: Secondary | ICD-10-CM

## 2018-12-23 DIAGNOSIS — I5022 Chronic systolic (congestive) heart failure: Secondary | ICD-10-CM

## 2018-12-23 DIAGNOSIS — I493 Ventricular premature depolarization: Secondary | ICD-10-CM

## 2018-12-23 DIAGNOSIS — I110 Hypertensive heart disease with heart failure: Secondary | ICD-10-CM | POA: Diagnosis not present

## 2018-12-23 DIAGNOSIS — Z7901 Long term (current) use of anticoagulants: Secondary | ICD-10-CM | POA: Diagnosis not present

## 2018-12-23 NOTE — Patient Instructions (Addendum)
Medication Instructions:  Your physician recommends that you continue on your current medications as directed. Please refer to the Current Medication list given to you today.  *If you need a refill on your cardiac medications before your next appointment, please call your pharmacy*  Lab Work: Your physician recommends that you return for lab work the next time your get labs drawn at the [place] [place]. Please have these results faxed to our office for Dr. Bettina Gavia to review at [phone].  If you have labs (blood work) drawn today and your tests are completely normal, you will receive your results only by: [name] MyChart Message (if you have MyChart) OR . A paper copy in the mail If you have any lab test that is abnormal or we need to change your treatment, we will call you to review the results.  Testing/Procedures: Your physician has requested that you have an echocardiogram. Echocardiography is a painless test that uses sound waves to create images of your heart. It provides your doctor with information about the size and shape of your heart and how well your heart's chambers and valves are working. This procedure takes approximately one hour. There are no restrictions for this procedure.  Follow-Up: At [facility], you and your health needs are our priority.  As part of our continuing mission to provide you with exceptional heart care, we have created designated Provider Care Teams.  These Care Teams include your primary Cardiologist (physician) and Advanced Practice Providers (APPs -  Physician Assistants and Nurse Practitioners) who all work together to provide you with the care you need, when you need it.  Your next appointment:   3 month(s)  The format for your next appointment:   In Person  Provider:   Shirlee More, MD      Purchase on line at [facility] or at [facility]

## 2018-12-23 NOTE — Addendum Note (Signed)
Addended by: Austin Miles on: 12/23/2018 03:35 PM   Modules accepted: Orders

## 2019-01-02 DIAGNOSIS — C3411 Malignant neoplasm of upper lobe, right bronchus or lung: Secondary | ICD-10-CM | POA: Diagnosis not present

## 2019-01-28 DIAGNOSIS — E1169 Type 2 diabetes mellitus with other specified complication: Secondary | ICD-10-CM | POA: Diagnosis not present

## 2019-01-28 DIAGNOSIS — C3411 Malignant neoplasm of upper lobe, right bronchus or lung: Secondary | ICD-10-CM | POA: Diagnosis not present

## 2019-01-28 DIAGNOSIS — Z79899 Other long term (current) drug therapy: Secondary | ICD-10-CM | POA: Diagnosis not present

## 2019-02-02 DIAGNOSIS — I998 Other disorder of circulatory system: Secondary | ICD-10-CM | POA: Diagnosis not present

## 2019-02-02 DIAGNOSIS — C3411 Malignant neoplasm of upper lobe, right bronchus or lung: Secondary | ICD-10-CM | POA: Diagnosis not present

## 2019-02-02 DIAGNOSIS — R6889 Other general symptoms and signs: Secondary | ICD-10-CM | POA: Diagnosis not present

## 2019-02-09 DIAGNOSIS — C3411 Malignant neoplasm of upper lobe, right bronchus or lung: Secondary | ICD-10-CM | POA: Diagnosis not present

## 2019-02-09 DIAGNOSIS — C3490 Malignant neoplasm of unspecified part of unspecified bronchus or lung: Secondary | ICD-10-CM | POA: Diagnosis not present

## 2019-02-11 DIAGNOSIS — Z79899 Other long term (current) drug therapy: Secondary | ICD-10-CM | POA: Diagnosis not present

## 2019-02-11 DIAGNOSIS — C3411 Malignant neoplasm of upper lobe, right bronchus or lung: Secondary | ICD-10-CM | POA: Diagnosis not present

## 2019-02-19 ENCOUNTER — Other Ambulatory Visit: Payer: Self-pay

## 2019-02-19 ENCOUNTER — Ambulatory Visit (INDEPENDENT_AMBULATORY_CARE_PROVIDER_SITE_OTHER): Payer: Medicare Other

## 2019-02-19 DIAGNOSIS — I5022 Chronic systolic (congestive) heart failure: Secondary | ICD-10-CM

## 2019-02-19 DIAGNOSIS — I493 Ventricular premature depolarization: Secondary | ICD-10-CM

## 2019-02-19 DIAGNOSIS — I110 Hypertensive heart disease with heart failure: Secondary | ICD-10-CM | POA: Diagnosis not present

## 2019-02-19 NOTE — Progress Notes (Signed)
Complete echocardiogram has been performed.  Jimmy Avanti Jetter RDCS, RVT 

## 2019-02-23 ENCOUNTER — Telehealth: Payer: Self-pay | Admitting: Cardiology

## 2019-02-23 NOTE — Telephone Encounter (Signed)
Pt's wife aware of lab results ./cy

## 2019-02-23 NOTE — Telephone Encounter (Signed)
New message ° ° °Patient is returning call about echo results. Please call. °

## 2019-02-25 DIAGNOSIS — C3411 Malignant neoplasm of upper lobe, right bronchus or lung: Secondary | ICD-10-CM | POA: Diagnosis not present

## 2019-02-25 DIAGNOSIS — Z79899 Other long term (current) drug therapy: Secondary | ICD-10-CM | POA: Diagnosis not present

## 2019-02-26 ENCOUNTER — Other Ambulatory Visit: Payer: Self-pay | Admitting: Cardiology

## 2019-03-06 DIAGNOSIS — G620 Drug-induced polyneuropathy: Secondary | ICD-10-CM | POA: Diagnosis not present

## 2019-03-06 DIAGNOSIS — L97509 Non-pressure chronic ulcer of other part of unspecified foot with unspecified severity: Secondary | ICD-10-CM | POA: Diagnosis not present

## 2019-03-06 DIAGNOSIS — T451X5A Adverse effect of antineoplastic and immunosuppressive drugs, initial encounter: Secondary | ICD-10-CM | POA: Diagnosis not present

## 2019-03-09 DIAGNOSIS — C3411 Malignant neoplasm of upper lobe, right bronchus or lung: Secondary | ICD-10-CM | POA: Diagnosis not present

## 2019-03-11 DIAGNOSIS — Z79899 Other long term (current) drug therapy: Secondary | ICD-10-CM | POA: Diagnosis not present

## 2019-03-11 DIAGNOSIS — C3411 Malignant neoplasm of upper lobe, right bronchus or lung: Secondary | ICD-10-CM | POA: Diagnosis not present

## 2019-03-13 ENCOUNTER — Ambulatory Visit (INDEPENDENT_AMBULATORY_CARE_PROVIDER_SITE_OTHER): Payer: Medicare Other | Admitting: Sports Medicine

## 2019-03-13 ENCOUNTER — Encounter: Payer: Self-pay | Admitting: Sports Medicine

## 2019-03-13 ENCOUNTER — Other Ambulatory Visit: Payer: Self-pay

## 2019-03-13 ENCOUNTER — Other Ambulatory Visit: Payer: Self-pay | Admitting: Sports Medicine

## 2019-03-13 DIAGNOSIS — S90425A Blister (nonthermal), left lesser toe(s), initial encounter: Secondary | ICD-10-CM

## 2019-03-13 DIAGNOSIS — I739 Peripheral vascular disease, unspecified: Secondary | ICD-10-CM

## 2019-03-13 DIAGNOSIS — S90424A Blister (nonthermal), right lesser toe(s), initial encounter: Secondary | ICD-10-CM | POA: Diagnosis not present

## 2019-03-13 DIAGNOSIS — E119 Type 2 diabetes mellitus without complications: Secondary | ICD-10-CM

## 2019-03-13 DIAGNOSIS — M79671 Pain in right foot: Secondary | ICD-10-CM

## 2019-03-13 NOTE — Progress Notes (Signed)
Subjective: John Maddox is a 79 y.o. male patient with history of diabetes who presents to office today for evaluation of dark areas to his toes reports that these little spots popped up about 6 weeks ago to all the toes they do not hurt and they do not use or drain reports that he had a vascular study 1 week ago that was normal but is concerned about the purple discoloration and the coldness to the toes.  Patient denies any unusual pain to his feet patient is diabetic and reports that he is diet controlled and his last A1c was 6.7 and last saw PCP Dr. Delena Bali 1 week ago.  Patient denies any changes with medications or any other pedal at this time.  Review of Systems  All other systems reviewed and are negative.   Patient Active Problem List   Diagnosis Date Noted  . Hypertensive heart disease with heart failure ([place]) 06/06/2018  . Chronic anticoagulation 06/05/2018  . Infected prosthetic knee joint, initial encounter ([place]) 03/06/2018  . Pulmonary emboli ([place]) 03/06/2018  . Pulmonary embolism ([place]) 03/06/2018  . Anemia 01/17/2018  . Hypophosphatemia 01/15/2018  . Thrombocytopenia ([place]) 01/15/2018  . Diarrhea 01/14/2018  . Hematuria 01/14/2018  . Hypokalemia 01/14/2018  . Hypomagnesemia 01/14/2018  . AKI (acute kidney injury) ([place]) 01/08/2018  . Septic arthritis of knee, left ([place]) 01/04/2018  . Cardiomyopathy ([place]) 11/18/2017  . Lung mass 10/04/2017  . Left ventricular dysfunction 10/03/2017  . Non-small cell lung cancer ([place]) 10/03/2017  . Chronic combined systolic and diastolic heart failure ([place]) 10/03/2017  . PVC's (premature ventricular contractions) 09/23/2017  . Diastolic dysfunction 43/15/4008  . Diabetes mellitus ([place]) 09/23/2017  . Hypertensive heart disease 09/23/2017  . Gout 09/23/2017  . COPD (chronic obstructive pulmonary disease) ([place]) 09/23/2017  . Fatigue 09/23/2017  . Asthma 09/23/2017  . Esophageal reflux 09/23/2017  . Hyperlipidemia 09/23/2017  .  History of prostate cancer 09/23/2017  . AAA (abdominal aortic aneurysm) without rupture ([place]) 08/03/2015  . Atherosclerosis of native artery of both lower extremities ([place]) 08/03/2015  . Bilateral carotid artery stenosis 08/03/2015  . Duodenal nodule 11/10/2014  . Essential hypertension 11/10/2014  . Insulin dependent type 2 diabetes mellitus ([place]) 11/10/2014  . PVD (peripheral vascular disease) ([place]) 11/10/2014   Current Outpatient Medications on File Prior to Visit  Medication Sig Dispense Refill  . allopurinol (ZYLOPRIM) 300 MG tablet Take 300 mg by mouth daily.    [name] amitriptyline (ELAVIL) 50 MG tablet Take 50 mg by mouth as needed for sleep.     [name] apixaban (ELIQUIS) 5 MG TABS tablet Take 5 mg by mouth 2 (two) times daily.    . carvedilol (COREG) 3.125 MG tablet TAKE 1 TABLET TWICE A DAY 180 tablet 3  . celecoxib (CELEBREX) 200 MG capsule Take 200 mg by mouth 2 (two) times daily.    [name] ENTRESTO 97-103 MG TAKE 1 TABLET TWICE A DAY 180 tablet 1  . fluticasone-salmeterol (ADVAIR HFA) 115-21 MCG/ACT inhaler Inhale 2 puffs into the lungs 2 (two) times daily.     . Multiple Vitamin (MULTI-VITAMINS) TABS Take 1 tablet by mouth daily.    [name] omeprazole (PRILOSEC) 20 MG capsule Take 20 mg by mouth daily.    [name] oxyCODONE (OXY IR/ROXICODONE) 5 MG immediate release tablet Take 5 mg by mouth 4 (four) times daily.   0  . PROAIR HFA 108 (90 Base) MCG/ACT inhaler Inhale 1 puff into the lungs every 6 (six) hours as needed.     [name]  simvastatin (ZOCOR) 40 MG tablet Take 40 mg by mouth daily.    [name] tiotropium (SPIRIVA HANDIHALER) 18 MCG inhalation capsule Place 18 mcg into inhaler and inhale daily.     [name] torsemide (DEMADEX) 20 MG tablet Take 1 tablet (20 mg total) by mouth daily. 90 tablet 1   No current facility-administered medications on file prior to visit.   Allergies  Allergen Reactions  . Lantus [Insulin Glargine]     Stomach pain  . Quinolones Other (See Comments)    History of aortic aneurysm  status post repair.  Use with caution    No results found for this or any previous visit (from the past 2160 hour(s)).  Objective: General: Patient is awake, alert, and oriented x 3 and in no acute distress.  Using oxygen.  Integument: Skin is cool, dry and supple bilateral. Nails are mildly elongated, thickened and dystrophic with subungual debris, consistent with onychomycosis, 1-5 bilateral.  There are multiple superficial dry blood blisters noted to all digits 1 through 10 no signs of infection. No open lesions or preulcerative lesions present bilateral. Remaining integument unremarkable.  Vasculature:  Dorsalis Pedis pulse 1/4 bilateral. Posterior Tibial pulse  1/4 bilateral.  Capillary fill time <5 sec 1-5 bilateral.  No hair growth to the level of the digits.  Moderate varicosities present bilateral.  Trace edema present bilateral.  Purple discoloration to feet likely venous congestion in nature bilateral with decrease in temperature change.  Neurology: Johney Maine sensation present via light touch bilateral.  Musculoskeletal: Asymptomatic pes planus and hammertoe pedal deformities noted bilateral. Muscular strength 4/5 in all lower extremity muscular groups bilateral without pain on range of motion. No tenderness with calf compression bilateral.  Assessment and Plan: Problem List Items Addressed This Visit      Cardiovascular and Mediastinum   PVD (peripheral vascular disease) ([place])    Other Visit Diagnoses    Blister of toe of right foot, initial encounter    -  Primary   Blister of toe of left foot, initial encounter       Diabetes mellitus without complication ([place])         -Examined patient. -Discussed and educated patient on diabetic foot care, especially with  regards to the vascular, neurological and musculoskeletal systems.  -Vascular studies reviewed from [facility] acceptable advised patient that likely his changes to his feet are not arterial probably related to venous  congestion versus chronic heart problems.  Advised patient when he follows up with his cardiology in the upcoming weeks to mention to them also about the changes to his toes to see if there is anything they need to do to further work-up his heart issues -Applied Betadine to the toes and advised patient to do the same to keep them clean dry and free of infection at the areas of blisters to the toes that are dry currently -Recommend elevation to assist with edema control and dispensed surgitube compression sleeves for patient to wear during the day -Advised patient to keep feet warm to avoid using heat at a space heater or heated blanket to avoid thermal injury -Mechanically debrided all nails 1-5 bilateral using sterile nail nipper and filed with dremel without incident  -Answered all patient questions -Patient to return in 1 month for toe check -Patient advised to call the office if any problems or questions arise in the meantime.  Landis Martins, DPM

## 2019-03-25 DIAGNOSIS — C3411 Malignant neoplasm of upper lobe, right bronchus or lung: Secondary | ICD-10-CM | POA: Diagnosis not present

## 2019-03-25 DIAGNOSIS — Z79899 Other long term (current) drug therapy: Secondary | ICD-10-CM | POA: Diagnosis not present

## 2019-03-30 NOTE — Progress Notes (Signed)
Cardiology Office Note:    Date:  03/31/2019   ID:  John Maddox, DOB 11-11-1940, MRN [id number]  PCP:  John Reel, PA  Cardiologist:  John More, MD    Referring MD: John Maddox, [place]    ASSESSMENT:    1. Chronic systolic heart failure ([place])   2. Chronic anticoagulation   3. Hypertensive heart disease with heart failure ([place])   4. PVC's (premature ventricular contractions)   5. Non-small cell lung cancer, unspecified laterality ([place])    PLAN:    In order of problems listed above:  1. Stable compensated continue his current loop diuretic and Entresto.  He is on a beta-blocker await the results of the labs from [place] 2. Continue long-term anticoagulation with venous thromboembolism associated with lung cancer 3. BP at target continue his guideline directed therapy.  If he would be hypokalemic I would start him on spironolactone 4. Stable continue beta-blocker 5. Clinically stable after oncology treatment   Next appointment: 6 months   Medication Adjustments/Labs and Tests Ordered: Current medicines are reviewed at length with the patient today.  Concerns regarding medicines are outlined above.  Orders Placed This Encounter  Procedures   EKG 12-Lead   No orders of the defined types were placed in this encounter.   No chief complaint on file.   History of Present Illness:    John Maddox is a 79 y.o. male with a hx of PVC's, hypertension hyperlipidemia and peripheral arterial disease with endovascular repair abdominal aortic aneurysm 2006 and left external iliac artery stent in 2000. He was  last seen by vascular surgery September 2018 and had mild bilateral carotid artery stenosis.[name] He has stage III non-small cell cancer of the lung and has had orthopedic surgery and was admitted to the hospital 03/06/18-03/07/18 after a witnessed PEA arrest and was found to have  pulmonary emboli and RLE DVT. Echo 09/2017 EF 45-50% with MUGA 37% and  transitioned to Entresto at that time  He was last seen 12/23/2018. Compliance with diet, lifestyle and medications: Yes  In general he is doing well he has chronic stable shortness of breath related to his lung disease he tells me his malignancy is stable awaiting another CT scan he is not having edema orthopnea chest pain palpitation or syncope.  His toes have been discolored he had ABIs done at [facility] told are normal and was seen in consultation by podiatry.  I reviewed the results of his echocardiogram and told him his heart muscle function now is mildly reduced.  He is reassured.  Echo 02/19/2019: 1. Left ventricular ejection fraction, by visual estimation, is 45 to  50%. The left ventricle has normal function. There is no left ventricular  hypertrophy.  2. Left ventricular diastolic parameters are consistent with Grade I  diastolic dysfunction (impaired relaxation).  3. The left ventricle demonstrates global hypokinesis.  4. There is mild dilatation of the ascending aorta measuring 39 mm.  Past Medical History:  Diagnosis Date   AAA (abdominal aortic aneurysm) without rupture ([place]) 08/03/2015   AKI (acute kidney injury) ([place]) 01/08/2018   Last Assessment & Plan:  Improved Nephrology consulted Likely 2/2 ATNand CINin the setting of contrast at the outside hospital on 12/21, NSAIDs at the outside hospital, intra-operative hypotension, and entresto use. Avoid nephrotoxic medications(entresto/diuretic) Renally dose medications Strict I&O Monitor creatinine(1.68 today)    Anemia 01/17/2018   Last Assessment & Plan:  Due to chemotherapy and anemia of chronic disease Iron panel  shows anemia of chronic disease Follow up with PCP and oncologist   Arrhythmia    Asthma 09/23/2017   Atherosclerosis of native artery of both lower extremities ([place]) 08/03/2015   Bilateral carotid artery stenosis 08/03/2015   Cardiomyopathy ([place]) 11/18/2017   Chronic anticoagulation 06/05/2018    Chronic combined systolic and diastolic heart failure ([place]) 10/03/2017   Congestive heart failure ([place]) 09/23/2017   COPD (chronic obstructive pulmonary disease) ([place]) 09/23/2017   Diabetes mellitus ([place]) 09/23/2017   Diarrhea 01/14/2018   Last Assessment & Plan:  C diff - negative  GIP - negative Resolved   Diastolic dysfunction 05/18/6142   Duodenal nodule 11/10/2014   Esophageal reflux 09/23/2017   Essential hypertension 11/10/2014   Last Assessment & Plan:  Controlled off of medication Follow up with PCP as an outpatient   Fatigue 09/23/2017   Gout 09/23/2017   History of prostate cancer 09/23/2017   Hyperlipidemia 09/23/2017   Hypertension 09/23/2017   Hypertensive heart disease 09/23/2017   Hypertensive heart disease with heart failure ([place]) 06/06/2018   Hypokalemia 01/14/2018   Last Assessment & Plan:  Replace and follow up with PCP   Hypomagnesemia 01/14/2018   Last Assessment & Plan:  Replace and monitor and follow up with PCP   Hypophosphatemia 01/15/2018   Last Assessment & Plan:  Resolved   Infected prosthetic knee joint, initial encounter ([place]) 03/06/2018   Insulin dependent type 2 diabetes mellitus ([place]) 11/10/2014   Last Assessment & Plan:  Glucose 109-140 Continue ISS Monitor glucose   Left ventricular dysfunction 10/03/2017   Lung cancer ([place])    Lung mass 10/04/2017   Non-small cell lung cancer ([place]) 10/03/2017   Pulmonary emboli ([place]) 03/06/2018   Pulmonary embolism (HCC)    PVC's (premature ventricular contractions) 09/23/2017   PVD (peripheral vascular disease) ([place]) 11/10/2014   Septic arthritis of knee, left ([place]) 01/04/2018   Added automatically from request for surgery [id number]  Last Assessment & Plan:  Prosthetic knee infection S/p wash out Culture negative  Continue Dapto + Ceftriaxone + Rifampin for 4 weeks(currently day 7) ID was notified today prior to discharge He will need CBC, CMP, ESR, CRP weekly faxed to [phone](OPAT with ID) ID outpatient  appointment is on 02/17/2017  Antibiotics administered by port by ho   Thrombocytopenia ([place]) 01/15/2018   Last Assessment & Plan:  Decreased to 79.  Due to chemotherapy Peripheral smear negative for schistocytes LDH 211(within normal limits), haptoglobin 329(elevated), Retic 1.7 B12=714, Folate=20, copper pending    Past Surgical History:  Procedure Laterality Date   ABDOMINAL AORTIC ANEURYSM REPAIR     AORTA - ILIAC ARTERY BYPASS GRAFT     BACK SURGERY     CATARACT EXTRACTION, BILATERAL     CHOLECYSTECTOMY     HEMICOLECTOMY     HERNIA REPAIR     KNEE SURGERY Left    replaced all the hardwear   PROSTATE SURGERY     REPLACEMENT TOTAL KNEE     STENT PLACEMENT VASCULAR ([place] HX)     TONSILLECTOMY      Current Medications: Current Meds  Medication Sig   allopurinol (ZYLOPRIM) 300 MG tablet Take 300 mg by mouth daily.   amitriptyline (ELAVIL) 50 MG tablet Take 50 mg by mouth as needed for sleep.    apixaban (ELIQUIS) 5 MG TABS tablet Take 5 mg by mouth 2 (two) times daily.   carvedilol (COREG) 3.125 MG tablet TAKE 1 TABLET TWICE A DAY   celecoxib (CELEBREX) 200 MG capsule Take  200 mg by mouth 2 (two) times daily.   ENTRESTO 97-103 MG TAKE 1 TABLET TWICE A DAY   fluticasone-salmeterol (ADVAIR HFA) 115-21 MCG/ACT inhaler Inhale 2 puffs into the lungs 2 (two) times daily.    gabapentin (NEURONTIN) 100 MG capsule Take 100 mg by mouth 2 (two) times daily.   metFORMIN (GLUCOPHAGE) 500 MG tablet    Multiple Vitamin (MULTI-VITAMINS) TABS Take 1 tablet by mouth daily.   omeprazole (PRILOSEC) 20 MG capsule Take 20 mg by mouth daily.   oxyCODONE (OXY IR/ROXICODONE) 5 MG immediate release tablet Take 5 mg by mouth 4 (four) times daily.    PROAIR HFA 108 (90 Base) MCG/ACT inhaler Inhale 1 puff into the lungs every 6 (six) hours as needed.    simvastatin (ZOCOR) 40 MG tablet Take 40 mg by mouth daily.   tiotropium (SPIRIVA HANDIHALER) 18 MCG inhalation capsule Place 18  mcg into inhaler and inhale daily.    torsemide (DEMADEX) 20 MG tablet Take 1 tablet (20 mg total) by mouth daily.     Allergies:   Lantus [insulin glargine] and Quinolones   Social History   Socioeconomic History   Marital status: Married    Spouse name: Not on file   Number of children: Not on file   Years of education: Not on file   Highest education level: Not on file  Occupational History   Not on file  Tobacco Use   Smoking status: Former Smoker    Packs/day: 2.50    Years: 40.00    Pack years: 100.00    Types: Cigarettes    Quit date: 2014    Years since quitting: 7.2   Smokeless tobacco: Never Used  Substance and Sexual Activity   Alcohol use: Not Currently   Drug use: Not Currently   Sexual activity: Not on file  Other Topics Concern   Not on file  Social History Narrative   Not on file   Social Determinants of Health   Financial Resource Strain:    Difficulty of Paying Living Expenses:   Food Insecurity:    Worried About Charity fundraiser in the Last Year:    Arboriculturist in the Last Year:   Transportation Needs:    Film/video editor (Medical):    Lack of Transportation (Non-Medical):   Physical Activity:    Days of Exercise per Week:    Minutes of Exercise per Session:   Stress:    Feeling of Stress :   Social Connections:    Frequency of Communication with Friends and Family:    Frequency of Social Gatherings with Friends and Family:    Attends Religious Services:    Active Member of Clubs or Organizations:    Attends Music therapist:    Marital Status:      Family History: The patient's family history includes Alcohol abuse in his father; Arthritis in his brother, father, and mother; Heart attack in his father; Heart disease in his father; Hyperlipidemia in his father; Hypertension in his father and mother; Lung cancer in his brother and mother; Prostate cancer in his father. ROS:   Please see  the history of present illness.    All other systems reviewed and are negative.  EKGs/Labs/Other Studies Reviewed:    The following studies were reviewed today:  EKG:  EKG ordered today and personally reviewed.  The ekg ordered today demonstrates sinus rhythm left axis deviation 1 PVC  Recent Labs: Ending from [facility]  not in the epic system Physical Exam:    VS:  BP 128/82    Pulse 100    Temp 98.2 F (36.8 C)    Ht '5\' 10"'  (1.778 m)    Wt 250 lb (113.4 kg)    SpO2 96%    BMI 35.87 kg/m     Wt Readings from Last 3 Encounters:  03/31/19 250 lb (113.4 kg)  12/23/18 241 lb (109.3 kg)  09/16/18 227 lb 8 oz (103.2 kg)     GEN: He looks less chronically ill well nourished, well developed in no acute distress HEENT: Normal NECK: No JVD; No carotid bruits LYMPHATICS: No lymphadenopathy CARDIAC: RRR, no murmurs, rubs, gallops RESPIRATORY: Fusilli diminished no rales rhonchi or wheezing ABDOMEN: Soft, non-tender, non-distended MUSCULOSKELETAL:  No edema; No deformity toes are dusky but he has intact motor and sensory function and not acutely ischemic SKIN: Warm and dry NEUROLOGIC:  Alert and oriented x 3 PSYCHIATRIC:  Normal affect    Signed, John More, MD  03/31/2019 1:28 PM    [facility]

## 2019-03-31 ENCOUNTER — Other Ambulatory Visit: Payer: Self-pay

## 2019-03-31 ENCOUNTER — Ambulatory Visit (INDEPENDENT_AMBULATORY_CARE_PROVIDER_SITE_OTHER): Payer: Medicare Other | Admitting: Cardiology

## 2019-03-31 ENCOUNTER — Encounter: Payer: Self-pay | Admitting: Cardiology

## 2019-03-31 VITALS — BP 128/82 | HR 100 | Temp 98.2°F | Ht 70.0 in | Wt 250.0 lb

## 2019-03-31 DIAGNOSIS — Z7901 Long term (current) use of anticoagulants: Secondary | ICD-10-CM | POA: Diagnosis not present

## 2019-03-31 DIAGNOSIS — C3490 Malignant neoplasm of unspecified part of unspecified bronchus or lung: Secondary | ICD-10-CM | POA: Diagnosis not present

## 2019-03-31 DIAGNOSIS — I5022 Chronic systolic (congestive) heart failure: Secondary | ICD-10-CM | POA: Diagnosis not present

## 2019-03-31 DIAGNOSIS — I493 Ventricular premature depolarization: Secondary | ICD-10-CM

## 2019-03-31 DIAGNOSIS — I110 Hypertensive heart disease with heart failure: Secondary | ICD-10-CM | POA: Diagnosis not present

## 2019-03-31 NOTE — Patient Instructions (Signed)
Medication Instructions:   Your physician recommends that you continue on your current medications as directed. Please refer to the Current Medication list given to you today.  *If you need a refill on your cardiac medications before your next appointment, please call your pharmacy*   Lab Work: [place]    If you have labs (blood work) drawn today and your tests are completely normal, you will receive your results only by: [name] MyChart Message (if you have MyChart) OR . A paper copy in the mail If you have any lab test that is abnormal or we need to change your treatment, we will call you to review the results.   Testing/Procedures: NONE ORDERED  TODAY     Follow-Up: At [facility], you and your health needs are our priority.  As part of our continuing mission to provide you with exceptional heart care, we have created designated Provider Care Teams.  These Care Teams include your primary Cardiologist (physician) and Advanced Practice Providers (APPs -  Physician Assistants and Nurse Practitioners) who all work together to provide you with the care you need, when you need it.  We recommend signing up for the patient portal called "MyChart".  Sign up information is provided on this After Visit Summary.  MyChart is used to connect with patients for Virtual Visits (Telemedicine).  Patients are able to view lab/test results, encounter notes, upcoming appointments, etc.  Non-urgent messages can be sent to your provider as well.   To learn more about what you can do with MyChart, go to [email or web address].    Your next appointment:   6 month(s)  The format for your next appointment:   In Person  Provider:   You will see Shirlee More, MD.  Or, you can be scheduled with the following Advanced Practice Provider on your designated Care Team (at our [facility]):  Laurann Montana, FNP     Other Instructions

## 2019-04-10 ENCOUNTER — Ambulatory Visit: Payer: Medicare Other | Admitting: Sports Medicine

## 2019-04-27 DIAGNOSIS — C3491 Malignant neoplasm of unspecified part of right bronchus or lung: Secondary | ICD-10-CM | POA: Diagnosis not present

## 2019-04-27 DIAGNOSIS — E669 Obesity, unspecified: Secondary | ICD-10-CM | POA: Diagnosis not present

## 2019-04-27 DIAGNOSIS — J449 Chronic obstructive pulmonary disease, unspecified: Secondary | ICD-10-CM | POA: Diagnosis not present

## 2019-04-30 ENCOUNTER — Other Ambulatory Visit: Payer: Self-pay | Admitting: Cardiology

## 2019-05-05 DIAGNOSIS — Z85118 Personal history of other malignant neoplasm of bronchus and lung: Secondary | ICD-10-CM | POA: Diagnosis not present

## 2019-05-05 DIAGNOSIS — J439 Emphysema, unspecified: Secondary | ICD-10-CM | POA: Diagnosis not present

## 2019-05-05 DIAGNOSIS — C3411 Malignant neoplasm of upper lobe, right bronchus or lung: Secondary | ICD-10-CM | POA: Diagnosis not present

## 2019-05-06 DIAGNOSIS — D649 Anemia, unspecified: Secondary | ICD-10-CM | POA: Diagnosis not present

## 2019-05-06 DIAGNOSIS — C3411 Malignant neoplasm of upper lobe, right bronchus or lung: Secondary | ICD-10-CM | POA: Diagnosis not present

## 2019-05-06 DIAGNOSIS — J449 Chronic obstructive pulmonary disease, unspecified: Secondary | ICD-10-CM | POA: Diagnosis not present

## 2019-05-06 DIAGNOSIS — I714 Abdominal aortic aneurysm, without rupture: Secondary | ICD-10-CM | POA: Diagnosis not present

## 2019-05-06 DIAGNOSIS — I509 Heart failure, unspecified: Secondary | ICD-10-CM | POA: Diagnosis not present

## 2019-05-06 DIAGNOSIS — I252 Old myocardial infarction: Secondary | ICD-10-CM | POA: Diagnosis not present

## 2019-05-06 DIAGNOSIS — N189 Chronic kidney disease, unspecified: Secondary | ICD-10-CM | POA: Diagnosis not present

## 2019-05-06 DIAGNOSIS — Z8546 Personal history of malignant neoplasm of prostate: Secondary | ICD-10-CM | POA: Diagnosis not present

## 2019-06-14 ENCOUNTER — Other Ambulatory Visit: Payer: Self-pay | Admitting: Cardiology

## 2019-06-17 DIAGNOSIS — N189 Chronic kidney disease, unspecified: Secondary | ICD-10-CM | POA: Diagnosis not present

## 2019-06-17 DIAGNOSIS — I719 Aortic aneurysm of unspecified site, without rupture: Secondary | ICD-10-CM | POA: Diagnosis not present

## 2019-06-17 DIAGNOSIS — J449 Chronic obstructive pulmonary disease, unspecified: Secondary | ICD-10-CM | POA: Diagnosis not present

## 2019-06-17 DIAGNOSIS — D649 Anemia, unspecified: Secondary | ICD-10-CM | POA: Diagnosis not present

## 2019-06-17 DIAGNOSIS — I509 Heart failure, unspecified: Secondary | ICD-10-CM | POA: Diagnosis not present

## 2019-06-17 DIAGNOSIS — G8929 Other chronic pain: Secondary | ICD-10-CM | POA: Diagnosis not present

## 2019-06-17 DIAGNOSIS — C3411 Malignant neoplasm of upper lobe, right bronchus or lung: Secondary | ICD-10-CM | POA: Diagnosis not present

## 2019-08-04 DIAGNOSIS — Z6836 Body mass index (BMI) 36.0-36.9, adult: Secondary | ICD-10-CM | POA: Diagnosis not present

## 2019-08-04 DIAGNOSIS — E785 Hyperlipidemia, unspecified: Secondary | ICD-10-CM | POA: Diagnosis not present

## 2019-08-04 DIAGNOSIS — Z139 Encounter for screening, unspecified: Secondary | ICD-10-CM | POA: Diagnosis not present

## 2019-08-04 DIAGNOSIS — Z0000 Encounter for general adult medical examination without abnormal findings: Secondary | ICD-10-CM | POA: Diagnosis not present

## 2019-08-04 DIAGNOSIS — Z9181 History of falling: Secondary | ICD-10-CM | POA: Diagnosis not present

## 2019-08-04 DIAGNOSIS — Z1331 Encounter for screening for depression: Secondary | ICD-10-CM | POA: Diagnosis not present

## 2019-08-07 DIAGNOSIS — H532 Diplopia: Secondary | ICD-10-CM | POA: Diagnosis not present

## 2019-08-11 DIAGNOSIS — E119 Type 2 diabetes mellitus without complications: Secondary | ICD-10-CM | POA: Diagnosis not present

## 2019-08-11 DIAGNOSIS — J701 Chronic and other pulmonary manifestations due to radiation: Secondary | ICD-10-CM | POA: Diagnosis not present

## 2019-08-11 DIAGNOSIS — C3411 Malignant neoplasm of upper lobe, right bronchus or lung: Secondary | ICD-10-CM | POA: Diagnosis not present

## 2019-08-14 DIAGNOSIS — C3411 Malignant neoplasm of upper lobe, right bronchus or lung: Secondary | ICD-10-CM | POA: Diagnosis not present

## 2019-08-19 DIAGNOSIS — G620 Drug-induced polyneuropathy: Secondary | ICD-10-CM | POA: Diagnosis not present

## 2019-08-19 DIAGNOSIS — Z8546 Personal history of malignant neoplasm of prostate: Secondary | ICD-10-CM | POA: Diagnosis not present

## 2019-08-19 DIAGNOSIS — Z86711 Personal history of pulmonary embolism: Secondary | ICD-10-CM | POA: Diagnosis not present

## 2019-08-19 DIAGNOSIS — I5040 Unspecified combined systolic (congestive) and diastolic (congestive) heart failure: Secondary | ICD-10-CM | POA: Diagnosis not present

## 2019-08-19 DIAGNOSIS — E1169 Type 2 diabetes mellitus with other specified complication: Secondary | ICD-10-CM | POA: Diagnosis not present

## 2019-08-19 DIAGNOSIS — M109 Gout, unspecified: Secondary | ICD-10-CM | POA: Diagnosis not present

## 2019-08-19 DIAGNOSIS — K219 Gastro-esophageal reflux disease without esophagitis: Secondary | ICD-10-CM | POA: Diagnosis not present

## 2019-08-19 DIAGNOSIS — J449 Chronic obstructive pulmonary disease, unspecified: Secondary | ICD-10-CM | POA: Diagnosis not present

## 2019-08-19 DIAGNOSIS — J9611 Chronic respiratory failure with hypoxia: Secondary | ICD-10-CM | POA: Diagnosis not present

## 2019-08-19 DIAGNOSIS — Z85118 Personal history of other malignant neoplasm of bronchus and lung: Secondary | ICD-10-CM | POA: Diagnosis not present

## 2019-08-19 DIAGNOSIS — I10 Essential (primary) hypertension: Secondary | ICD-10-CM | POA: Diagnosis not present

## 2019-08-19 DIAGNOSIS — E785 Hyperlipidemia, unspecified: Secondary | ICD-10-CM | POA: Diagnosis not present

## 2019-09-29 ENCOUNTER — Other Ambulatory Visit: Payer: Self-pay

## 2019-09-29 ENCOUNTER — Ambulatory Visit (INDEPENDENT_AMBULATORY_CARE_PROVIDER_SITE_OTHER): Payer: Medicare Other | Admitting: Cardiology

## 2019-09-29 VITALS — BP 104/72 | HR 104 | Ht 70.0 in | Wt 255.0 lb

## 2019-09-29 DIAGNOSIS — I5042 Chronic combined systolic (congestive) and diastolic (congestive) heart failure: Secondary | ICD-10-CM | POA: Diagnosis not present

## 2019-09-29 DIAGNOSIS — C3490 Malignant neoplasm of unspecified part of unspecified bronchus or lung: Secondary | ICD-10-CM | POA: Diagnosis not present

## 2019-09-29 DIAGNOSIS — E782 Mixed hyperlipidemia: Secondary | ICD-10-CM

## 2019-09-29 DIAGNOSIS — Z7901 Long term (current) use of anticoagulants: Secondary | ICD-10-CM | POA: Diagnosis not present

## 2019-09-29 DIAGNOSIS — I110 Hypertensive heart disease with heart failure: Secondary | ICD-10-CM | POA: Diagnosis not present

## 2019-09-29 DIAGNOSIS — I499 Cardiac arrhythmia, unspecified: Secondary | ICD-10-CM | POA: Insufficient documentation

## 2019-09-29 NOTE — Progress Notes (Signed)
Cardiology Office Note:    Date:  09/29/2019   ID:  John Maddox, DOB 1940/08/17, MRN [id number]  PCP:  John Reel, PA  Cardiologist:  John More, MD    Referring MD: John Maddox, [place]    ASSESSMENT:    1. Chronic combined systolic and diastolic heart failure ([place])   2. Chronic anticoagulation   3. Hypertensive heart disease with heart failure ([place])   4. Non-small cell lung cancer, unspecified laterality ([place])   5. Mixed hyperlipidemia    PLAN:    In order of problems listed above:  1. Best termed heart failure systolic with recovered ejection fraction is done exceptionally well continue guideline directed therapy maximally tolerated dose of beta-blocker and Entresto.  I would not add on MRA or SGLT2 with borderline blood pressure.  Next visit we will need to think about a repeat echocardiogram 2. Continue long-term anticoagulation with cancer and pulmonary embolism 3. Stable continue guideline directed therapy 4. Lipids are ideal blood LDL at target continue a statin   Next appointment: 6 months   Medication Adjustments/Labs and Tests Ordered: Current medicines are reviewed at length with the patient today.  Concerns regarding medicines are outlined above.  No orders of the defined types were placed in this encounter.  No orders of the defined types were placed in this encounter.   No chief complaint on file.   History of Present Illness:    John Maddox is a 79 y.o. male with a hx of  PVC's, hypertension hyperlipidemia and peripheral arterial disease with endovascular repair abdominal aortic aneurysm 2006 and left external iliac artery stent in 2000. He was seen by vascular surgery September 2018 and had mild bilateral carotid artery stenosis.[name] He has stage III non-small cell cancer of the lung and has had orthopedic surgery and was admitted to the hospital 03/06/18-03/07/18 after a witnessed PEA arrest and was found to have  pulmonary emboli and  RLE DVT. Echo 09/2017 EF 45-50% with MUGA 37% and transitioned to Entresto at that time    last seen 03/31/2019. Compliance with diet, lifestyle and medications: Yes  It is a delight to see him slowly steadily improving tells me his cancer is quiesced sent and he has no edema shortness of breath chest pain palpitation or syncope his strength and endurance are better he is pleased with the quality of his life.  Tolerates his anticoagulant without bleeding recent labs from his PCP shows lipids at target cholesterol 119 HDL 36 LDL 62 triglycerides 116 A1c is good 7.1% creatinine 1.41 all these were performed 08/19/2019  His most recent echocardiogram shows improvement ejection fraction 45 to 50%. Echo 02/19/2019: 1. Left ventricular ejection fraction, by visual estimation, is 45 to  50%. The left ventricle has normal function. There is no left ventricular  hypertrophy.  2. Left ventricular diastolic parameters are consistent with Grade I  diastolic dysfunction (impaired relaxation).  3. The left ventricle demonstrates global hypokinesis.  4. There is mild dilatation of the ascending aorta measuring 39 mm.  Past Medical History:  Diagnosis Date  . AAA (abdominal aortic aneurysm) without rupture ([place]) 08/03/2015  . AKI (acute kidney injury) ([place]) 01/08/2018   Last Assessment & Plan:  Improved Nephrology consulted Likely 2/2 ATNand CINin the setting of contrast at the outside hospital on 12/21, NSAIDs at the outside hospital, intra-operative hypotension, and entresto use. Avoid nephrotoxic medications(entresto/diuretic) Renally dose medications Strict I&O Monitor creatinine(1.68 today)   . Anemia 01/17/2018   Last Assessment &  Plan:  Due to chemotherapy and anemia of chronic disease Iron panel shows anemia of chronic disease Follow up with PCP and oncologist  . Arrhythmia   . Asthma 09/23/2017  . Atherosclerosis of native artery of both lower extremities ([place]) 08/03/2015  . Bilateral carotid  artery stenosis 08/03/2015  . Cardiomyopathy ([place]) 11/18/2017  . Chronic anticoagulation 06/05/2018  . Chronic combined systolic and diastolic heart failure ([place]) 10/03/2017  . Congestive heart failure ([place]) 09/23/2017  . COPD (chronic obstructive pulmonary disease) ([place]) 09/23/2017  . Diabetes mellitus ([place]) 09/23/2017  . Diarrhea 01/14/2018   Last Assessment & Plan:  C diff - negative  GIP - negative Resolved  . Diastolic dysfunction 01/21/3843  . Duodenal nodule 11/10/2014  . Esophageal reflux 09/23/2017  . Essential hypertension 11/10/2014   Last Assessment & Plan:  Controlled off of medication Follow up with PCP as an outpatient  . Fatigue 09/23/2017  . Gout 09/23/2017  . Hematuria 01/14/2018   Last Assessment & Plan:  Need UA as outpatient.  . History of prostate cancer 09/23/2017  . Hyperlipidemia 09/23/2017  . Hypertension 09/23/2017  . Hypertensive heart disease 09/23/2017  . Hypertensive heart disease with heart failure ([place]) 06/06/2018  . Hypokalemia 01/14/2018   Last Assessment & Plan:  Replace and follow up with PCP  . Hypomagnesemia 01/14/2018   Last Assessment & Plan:  Replace and monitor and follow up with PCP  . Hypophosphatemia 01/15/2018   Last Assessment & Plan:  Resolved  . Infected prosthetic knee joint, initial encounter ([place]) 03/06/2018  . Insulin dependent type 2 diabetes mellitus ([place]) 11/10/2014   Last Assessment & Plan:  Glucose 109-140 Continue ISS Monitor glucose  . Left ventricular dysfunction 10/03/2017  . Lung cancer ([place])   . Lung mass 10/04/2017  . Non-small cell lung cancer ([place]) 10/03/2017  . Pulmonary emboli ([place]) 03/06/2018  . Pulmonary embolism ([place])   . PVC's (premature ventricular contractions) 09/23/2017  . PVD (peripheral vascular disease) ([place]) 11/10/2014  . Septic arthritis of knee, left ([place]) 01/04/2018   Added automatically from request for surgery [id number]  Last Assessment & Plan:  Prosthetic knee infection S/p wash out Culture negative  Continue Dapto +  Ceftriaxone + Rifampin for 4 weeks(currently day 7) ID was notified today prior to discharge He will need CBC, CMP, ESR, CRP weekly faxed to [phone](OPAT with ID) ID outpatient appointment is on 02/17/2017  Antibiotics administered by port by ho  . Thrombocytopenia ([place]) 01/15/2018   Last Assessment & Plan:  Decreased to 79.  Due to chemotherapy Peripheral smear negative for schistocytes LDH 211(within normal limits), haptoglobin 329(elevated), Retic 1.7 B12=714, Folate=20, copper pending    Past Surgical History:  Procedure Laterality Date  . ABDOMINAL AORTIC ANEURYSM REPAIR    . AORTA - ILIAC ARTERY BYPASS GRAFT    . BACK SURGERY    . CATARACT EXTRACTION, BILATERAL    . CHOLECYSTECTOMY    . HEMICOLECTOMY    . HERNIA REPAIR    . KNEE SURGERY Left    replaced all the hardwear  . PROSTATE SURGERY    . REPLACEMENT TOTAL KNEE    . STENT PLACEMENT VASCULAR ([place] HX)    . TONSILLECTOMY      Current Medications: Current Meds  Medication Sig  . allopurinol (ZYLOPRIM) 300 MG tablet Take 300 mg by mouth daily.  [name] amitriptyline (ELAVIL) 50 MG tablet Take 50 mg by mouth as needed for sleep.   [name] apixaban (ELIQUIS) 5 MG TABS tablet Take 5 mg  by mouth 2 (two) times daily.  . carvedilol (COREG) 3.125 MG tablet TAKE 1 TABLET TWICE A DAY  . celecoxib (CELEBREX) 200 MG capsule Take 200 mg by mouth 2 (two) times daily.  [name] ENTRESTO 97-103 MG TAKE 1 TABLET TWICE A DAY  . fluticasone-salmeterol (ADVAIR HFA) 115-21 MCG/ACT inhaler Inhale 2 puffs into the lungs 2 (two) times daily.   [name] gabapentin (NEURONTIN) 100 MG capsule Take 100 mg by mouth 2 (two) times daily.  . metFORMIN (GLUCOPHAGE) 500 MG tablet Take 500 mg by mouth daily with breakfast.   . Multiple Vitamin (MULTI-VITAMINS) TABS Take 1 tablet by mouth daily.  [name] omeprazole (PRILOSEC) 20 MG capsule Take 20 mg by mouth daily.  [name] oxyCODONE (OXY IR/ROXICODONE) 5 MG immediate release tablet Take 5 mg by mouth 4 (four) times daily.   [name] PROAIR HFA 108  (90 Base) MCG/ACT inhaler Inhale 1 puff into the lungs every 6 (six) hours as needed.   . simvastatin (ZOCOR) 40 MG tablet Take 40 mg by mouth daily.  [name] tiotropium (SPIRIVA HANDIHALER) 18 MCG inhalation capsule Place 18 mcg into inhaler and inhale daily.   [name] torsemide (DEMADEX) 20 MG tablet TAKE 1 TABLET DAILY     Allergies:   Lantus [insulin glargine] and Quinolones   Social History   Socioeconomic History  . Marital status: Married    Spouse name: Not on file  . Number of children: Not on file  . Years of education: Not on file  . Highest education level: Not on file  Occupational History  . Not on file  Tobacco Use  . Smoking status: Former Smoker    Packs/day: 2.50    Years: 40.00    Pack years: 100.00    Types: Cigarettes    Quit date: 2014    Years since quitting: 7.7  . Smokeless tobacco: Never Used  Vaping Use  . Vaping Use: Never used  Substance and Sexual Activity  . Alcohol use: Not Currently  . Drug use: Not Currently  . Sexual activity: Not on file  Other Topics Concern  . Not on file  Social History Narrative  . Not on file   Social Determinants of Health   Financial Resource Strain:   . Difficulty of Paying Living Expenses: Not on file  Food Insecurity:   . Worried About Charity fundraiser in the Last Year: Not on file  . Ran Out of Food in the Last Year: Not on file  Transportation Needs:   . Lack of Transportation (Medical): Not on file  . Lack of Transportation (Non-Medical): Not on file  Physical Activity:   . Days of Exercise per Week: Not on file  . Minutes of Exercise per Session: Not on file  Stress:   . Feeling of Stress : Not on file  Social Connections:   . Frequency of Communication with Friends and Family: Not on file  . Frequency of Social Gatherings with Friends and Family: Not on file  . Attends Religious Services: Not on file  . Active Member of Clubs or Organizations: Not on file  . Attends Archivist Meetings: Not  on file  . Marital Status: Not on file     Family History: The patient's family history includes Alcohol abuse in his father; Arthritis in his brother, father, and mother; Heart attack in his father; Heart disease in his father; Hyperlipidemia in his father; Hypertension in his father and mother; Lung cancer in his brother and mother;  Prostate cancer in his father. ROS:   Please see the history of present illness.    All other systems reviewed and are negative.  EKGs/Labs/Other Studies Reviewed:    The following studies were reviewed today:   Physical Exam:    VS:  BP 104/72   Pulse (!) 104   Ht '5\' 10"'  (1.778 m)   Wt 255 lb (115.7 kg)   SpO2 97%   BMI 36.59 kg/m     Wt Readings from Last 3 Encounters:  09/29/19 255 lb (115.7 kg)  03/31/19 250 lb (113.4 kg)  12/23/18 241 lb (109.3 kg)     GEN: He no longer looks chronically ill or debilitated well nourished, well developed in no acute distress HEENT: Normal NECK: No JVD; No carotid bruits LYMPHATICS: No lymphadenopathy CARDIAC: RRR, no murmurs, rubs, gallops RESPIRATORY:  Clear to auscultation without rales, wheezing or rhonchi  ABDOMEN: Soft, non-tender, non-distended MUSCULOSKELETAL:  No edema; No deformity  SKIN: Warm and dry NEUROLOGIC:  Alert and oriented x 3 PSYCHIATRIC:  Normal affect    Signed, John More, MD  09/29/2019 1:22 PM    [facility]

## 2019-09-29 NOTE — Patient Instructions (Signed)

## 2019-10-29 DIAGNOSIS — L0291 Cutaneous abscess, unspecified: Secondary | ICD-10-CM | POA: Diagnosis not present

## 2019-10-29 DIAGNOSIS — L0390 Cellulitis, unspecified: Secondary | ICD-10-CM | POA: Diagnosis not present

## 2019-11-04 DIAGNOSIS — Z23 Encounter for immunization: Secondary | ICD-10-CM | POA: Diagnosis not present

## 2019-11-13 ENCOUNTER — Other Ambulatory Visit: Payer: Self-pay | Admitting: Hematology and Oncology

## 2019-11-13 DIAGNOSIS — Z79899 Other long term (current) drug therapy: Secondary | ICD-10-CM | POA: Diagnosis not present

## 2019-11-13 DIAGNOSIS — C3411 Malignant neoplasm of upper lobe, right bronchus or lung: Secondary | ICD-10-CM | POA: Diagnosis not present

## 2019-11-13 DIAGNOSIS — Z6836 Body mass index (BMI) 36.0-36.9, adult: Secondary | ICD-10-CM | POA: Diagnosis not present

## 2019-11-13 DIAGNOSIS — E538 Deficiency of other specified B group vitamins: Secondary | ICD-10-CM | POA: Diagnosis not present

## 2019-11-13 DIAGNOSIS — L0390 Cellulitis, unspecified: Secondary | ICD-10-CM | POA: Diagnosis not present

## 2019-11-13 LAB — HEPATIC FUNCTION PANEL
ALT: 24 (ref 10–40)
AST: 37 (ref 14–40)
Alkaline Phosphatase: 116 (ref 25–125)
Bilirubin, Total: 0.6

## 2019-11-13 LAB — BASIC METABOLIC PANEL
BUN: 14 (ref 4–21)
CO2: 28 — AB (ref 13–22)
Chloride: 106 (ref 99–108)
Creatinine: 1.3 (ref 0.6–1.3)
Glucose: 101
Potassium: 4 (ref 3.4–5.3)
Sodium: 141 (ref 137–147)

## 2019-11-13 LAB — COMPREHENSIVE METABOLIC PANEL
Albumin: 3.8 (ref 3.5–5.0)
Calcium: 9.3 (ref 8.7–10.7)

## 2019-11-13 LAB — CBC AND DIFFERENTIAL
HCT: 36 — AB (ref 41–53)
Hemoglobin: 11.7 — AB (ref 13.5–17.5)
Neutrophils Absolute: 3.41
Platelets: 182 (ref 150–399)
WBC: 5.5

## 2019-11-13 LAB — CBC: RBC: 4.06 (ref 3.87–5.11)

## 2019-11-16 ENCOUNTER — Telehealth: Payer: Self-pay

## 2019-11-16 DIAGNOSIS — E119 Type 2 diabetes mellitus without complications: Secondary | ICD-10-CM | POA: Diagnosis not present

## 2019-11-16 DIAGNOSIS — L0291 Cutaneous abscess, unspecified: Secondary | ICD-10-CM | POA: Diagnosis not present

## 2019-11-16 DIAGNOSIS — Z6836 Body mass index (BMI) 36.0-36.9, adult: Secondary | ICD-10-CM | POA: Diagnosis not present

## 2019-11-16 NOTE — Telephone Encounter (Signed)
Pts wife notified, verbalized understanding.

## 2019-11-16 NOTE — Telephone Encounter (Signed)
-----   Message from Marvia Pickles, PA-C sent at 11/16/2019  2:24 PM EDT ----- Regarding: Call patient Please let him know all the labs done to evaluate anemia were normal, likely due to chronic kidney disease, so nothing to be done. We will continue to monitor.

## 2019-11-24 ENCOUNTER — Telehealth: Payer: Self-pay

## 2019-11-24 NOTE — Telephone Encounter (Signed)
Velva Harman called to question follow up appt for patient. [phone].  Thanks

## 2019-12-02 ENCOUNTER — Other Ambulatory Visit: Payer: Self-pay

## 2019-12-02 DIAGNOSIS — G620 Drug-induced polyneuropathy: Secondary | ICD-10-CM | POA: Diagnosis not present

## 2019-12-02 DIAGNOSIS — I5040 Unspecified combined systolic (congestive) and diastolic (congestive) heart failure: Secondary | ICD-10-CM | POA: Diagnosis not present

## 2019-12-02 DIAGNOSIS — K219 Gastro-esophageal reflux disease without esophagitis: Secondary | ICD-10-CM | POA: Diagnosis not present

## 2019-12-02 DIAGNOSIS — J9611 Chronic respiratory failure with hypoxia: Secondary | ICD-10-CM | POA: Diagnosis not present

## 2019-12-02 DIAGNOSIS — Z85118 Personal history of other malignant neoplasm of bronchus and lung: Secondary | ICD-10-CM | POA: Diagnosis not present

## 2019-12-02 DIAGNOSIS — E1169 Type 2 diabetes mellitus with other specified complication: Secondary | ICD-10-CM | POA: Diagnosis not present

## 2019-12-02 DIAGNOSIS — M1711 Unilateral primary osteoarthritis, right knee: Secondary | ICD-10-CM

## 2019-12-02 DIAGNOSIS — Z86711 Personal history of pulmonary embolism: Secondary | ICD-10-CM | POA: Diagnosis not present

## 2019-12-02 DIAGNOSIS — C61 Malignant neoplasm of prostate: Secondary | ICD-10-CM

## 2019-12-02 DIAGNOSIS — C3411 Malignant neoplasm of upper lobe, right bronchus or lung: Secondary | ICD-10-CM

## 2019-12-02 DIAGNOSIS — J449 Chronic obstructive pulmonary disease, unspecified: Secondary | ICD-10-CM | POA: Diagnosis not present

## 2019-12-02 DIAGNOSIS — M109 Gout, unspecified: Secondary | ICD-10-CM | POA: Diagnosis not present

## 2019-12-02 DIAGNOSIS — E785 Hyperlipidemia, unspecified: Secondary | ICD-10-CM | POA: Diagnosis not present

## 2019-12-02 DIAGNOSIS — T451X5A Adverse effect of antineoplastic and immunosuppressive drugs, initial encounter: Secondary | ICD-10-CM | POA: Diagnosis not present

## 2019-12-02 DIAGNOSIS — I10 Essential (primary) hypertension: Secondary | ICD-10-CM | POA: Diagnosis not present

## 2019-12-02 MED ORDER — OXYCODONE HCL 5 MG PO TABS: 5.0000 mg | ORAL_TABLET | Freq: Four times a day (QID) | ORAL | 0 refills | Status: DC | PRN

## 2019-12-18 ENCOUNTER — Other Ambulatory Visit: Payer: Self-pay | Admitting: Hematology and Oncology

## 2020-01-01 ENCOUNTER — Other Ambulatory Visit: Payer: Self-pay

## 2020-01-01 DIAGNOSIS — M1711 Unilateral primary osteoarthritis, right knee: Secondary | ICD-10-CM

## 2020-01-01 MED ORDER — OXYCODONE HCL 5 MG PO TABS: 5.0000 mg | ORAL_TABLET | Freq: Four times a day (QID) | ORAL | 0 refills | Status: DC | PRN

## 2020-01-25 ENCOUNTER — Other Ambulatory Visit: Payer: Self-pay | Admitting: Cardiology

## 2020-02-01 ENCOUNTER — Other Ambulatory Visit: Payer: Self-pay | Admitting: Hematology and Oncology

## 2020-02-01 DIAGNOSIS — M1711 Unilateral primary osteoarthritis, right knee: Secondary | ICD-10-CM

## 2020-02-01 MED ORDER — OXYCODONE HCL 5 MG PO TABS: 5.0000 mg | ORAL_TABLET | Freq: Four times a day (QID) | ORAL | 0 refills | Status: DC | PRN

## 2020-02-04 IMAGING — CT CT ANGIO CHEST
2 of 9 series · 17 of 46 positions shown · IV contrast (isovue)
Comparison: Portable chest obtained earlier today. Chest CTA dated
01/01/2018.

CLINICAL DATA: Status post CPR for cardiac arrest. Status post
chemotherapy and radiation therapy for lung cancer.

EXAM:
CT ANGIOGRAPHY CHEST WITH CONTRAST
TECHNIQUE: Multidetector CT imaging of the chest was performed using the
standard protocol during bolus administration of intravenous
contrast. Multiplanar CT image reconstructions and MIPs were
obtained to evaluate the vascular anatomy.
CONTRAST:  75 cc Isovue 370

[Series 6: thins · axial · 0.94mm/px · z∈[+644,+922]mm · 14 of 314 slices shown]
[im 18/314  lung]
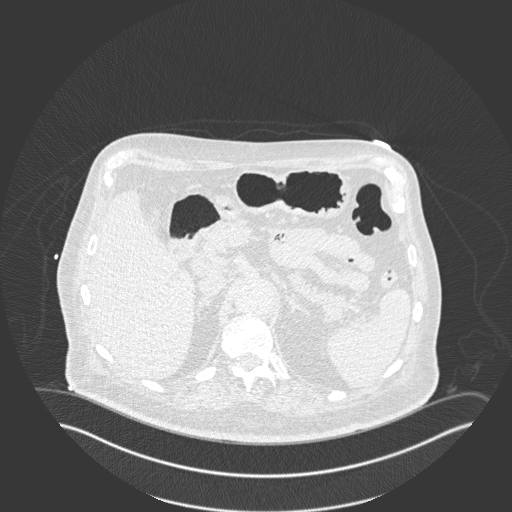
[im 35/314  soft-tissue]
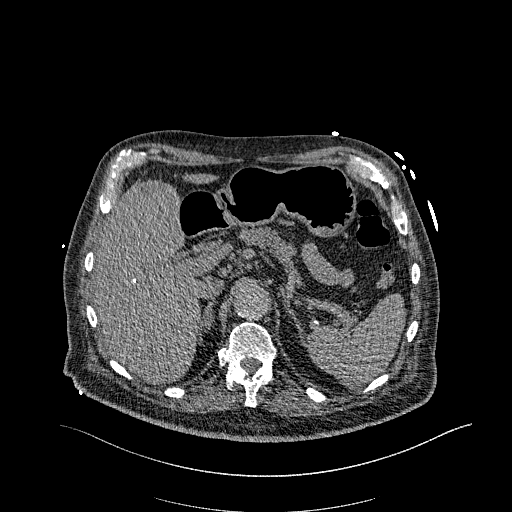
[im 70/314  lung]
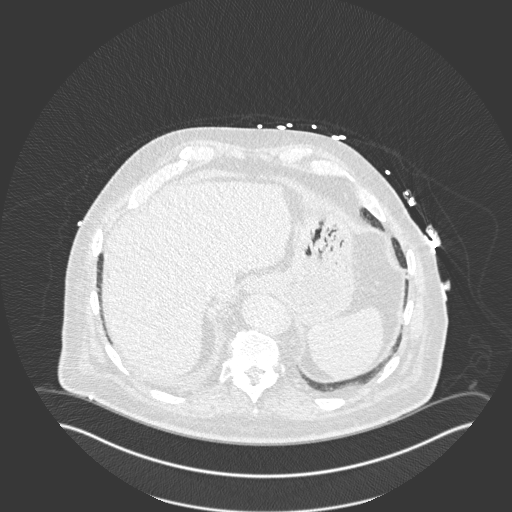
[im 87/314  soft-tissue]
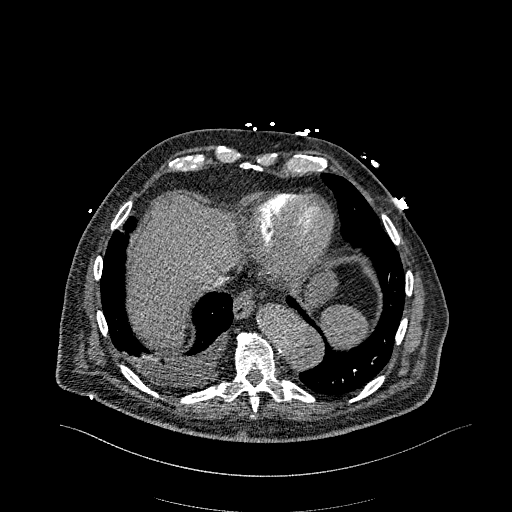
[im 105/314  lung]
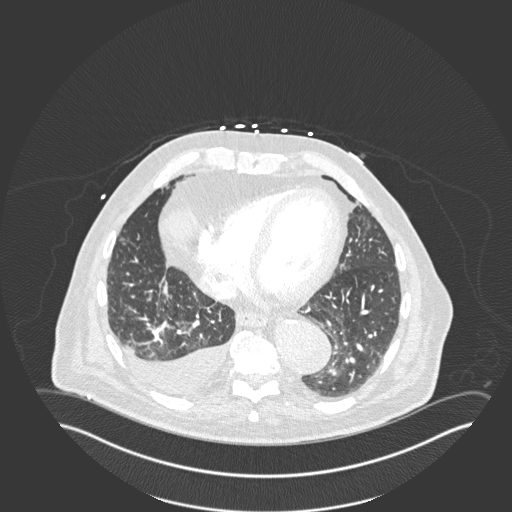
[im 122/314  soft-tissue]
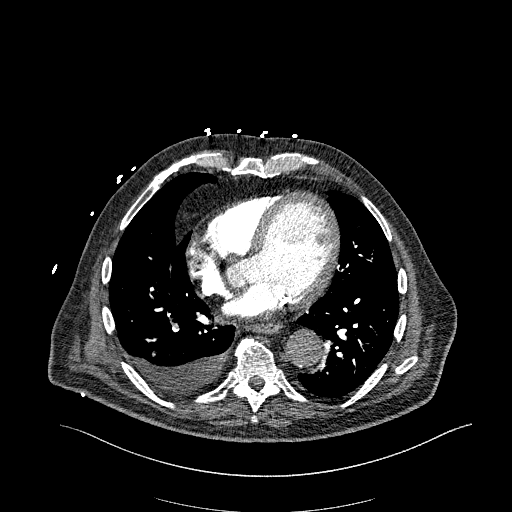
[im 140/314  lung]
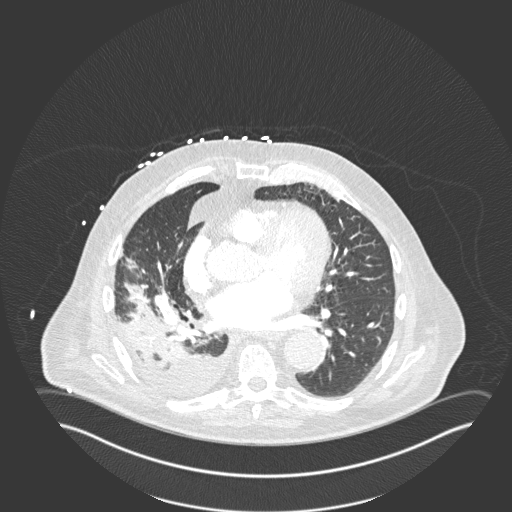
[im 174/314  soft-tissue]
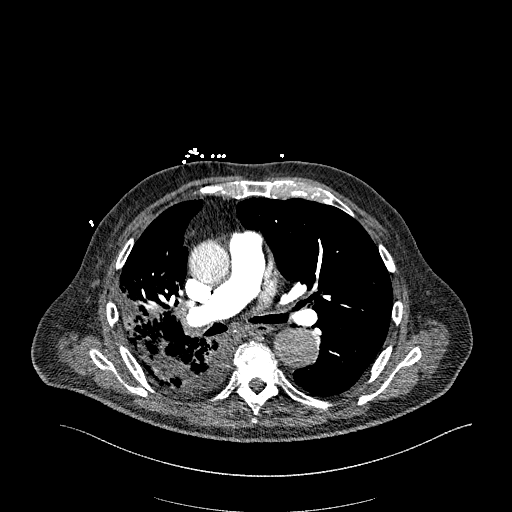
[im 192/314  lung]
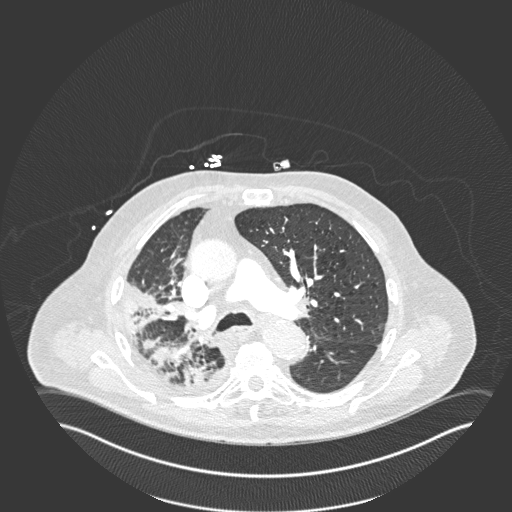
[im 209/314  soft-tissue]
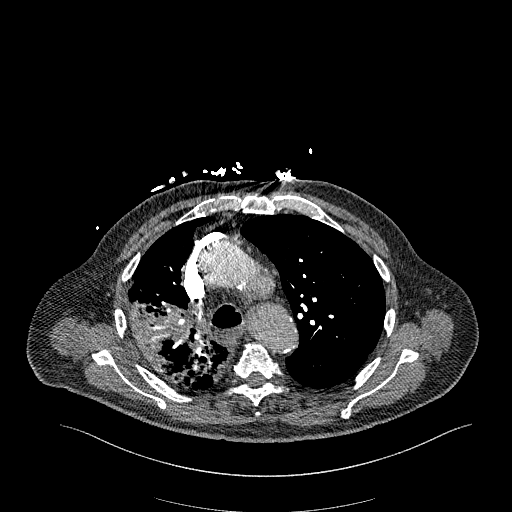
[im 227/314  lung]
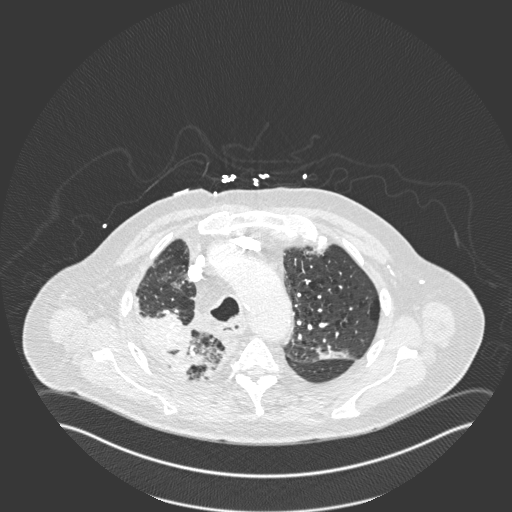
[im 244/314  soft-tissue]
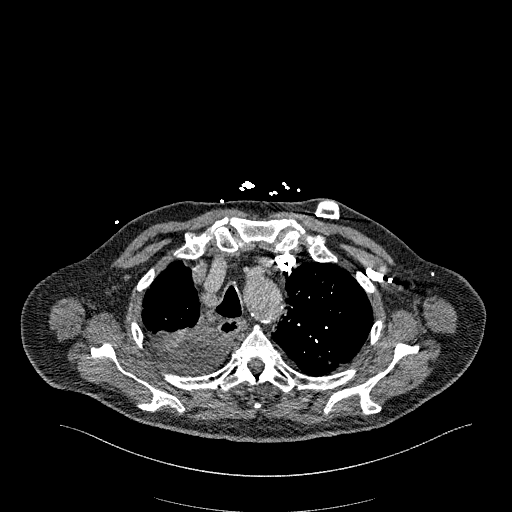
[im 279/314  lung]
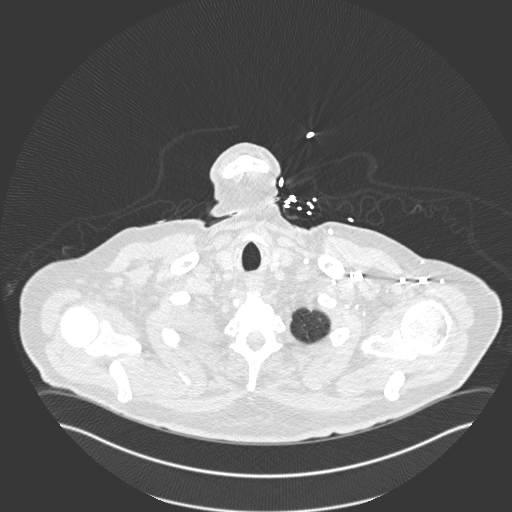
[im 296/314  soft-tissue]
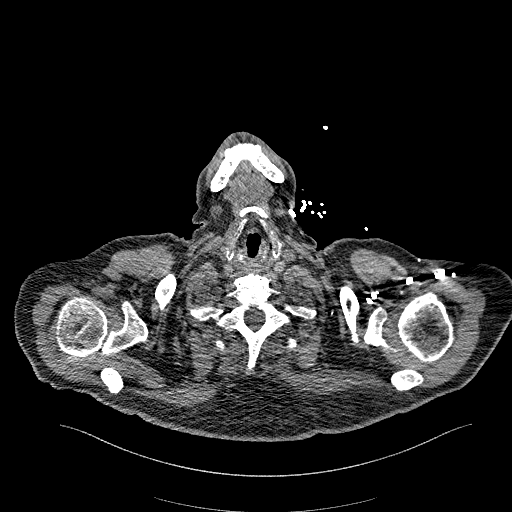

[Series 8: coronal mpr · coronal · 0.60mm/px · 3 of 150 slices shown]
[im 38/150  soft-tissue]
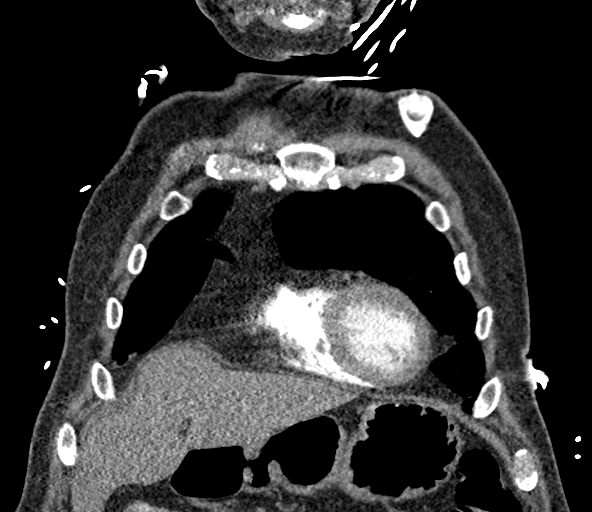
[im 75/150  soft-tissue]
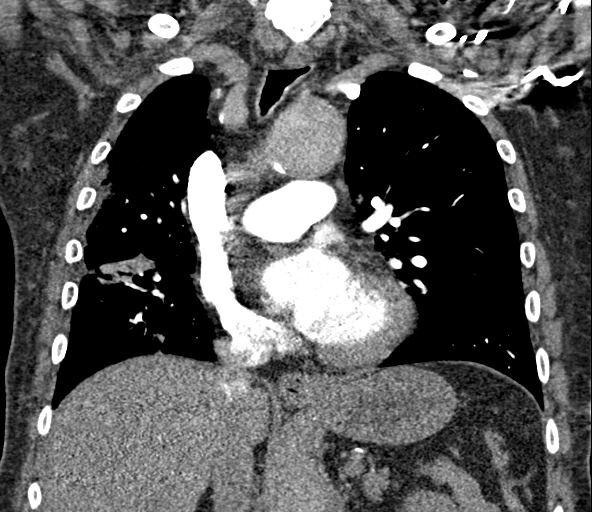
[im 112/150  soft-tissue]
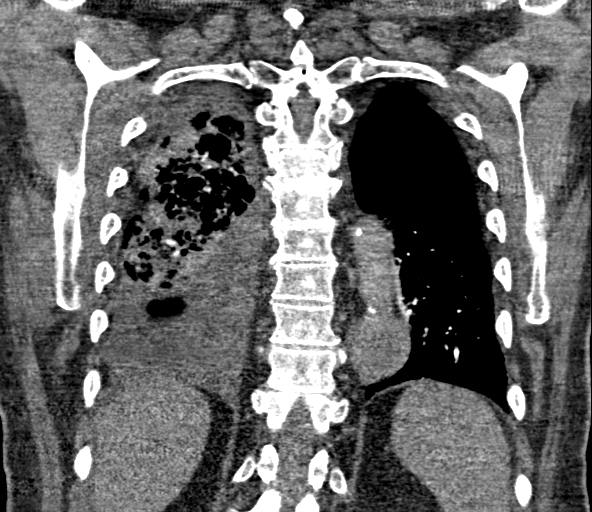

[17 of 46 positions shown; findings below may reference images not displayed]

FINDINGS: Cardiovascular: Interval small to moderate-sized non occluding
thrombus bridging two right lower lobe pulmonary arteries and
extending into both arteries with opacified blood around the clot in
both arteries. No other pulmonary arterial filling defects are seen.

The aortic arch remains dilated with a maximum transverse diameter
of 3.9 cm on image number 31 series 5, without significant change.
The distal descending thoracic aorta also remains dilated with a
maximum transverse diameter of 4.3 cm on image number 74 series 5,
unchanged. On sagittal image number 76 of series 9, this measures
4.9 cm in maximum diameter. The proximal abdominal aorta also
remains dilated with a maximum diameter 4.6 cm on image number 105
series 5, unchanged.

A prominent epicardial fat pad is again demonstrated with no
pericardial fluid. The heart remains borderline enlarged.
Atheromatous calcifications, including the coronary arteries and
aorta.

Mediastinum/Nodes: The previously demonstrated 1.3 cm short axis
right hilar node currently has a short axis diameter of 1.4 cm on
image number 45 series 5. Additional smaller enlarged right hilar
nodes have not changed significantly.

Unremarkable thyroid gland and esophagus.

Lungs/Pleura: Interval small right pleural effusion and interval
patchy opacity in the right lower lobe as well as interval patchy
and confluent opacity in the right upper lobe. These changes are
partially obscuring the previously demonstrated 4.0 x 2.8 cm
spiculated mass in the posterior right upper lobe. This currently
measures approximately 3.8 x 2.9 cm in corresponding dimensions on
image number 43 series 7.

Previously demonstrated mild changes of centrilobular emphysema.

Upper Abdomen: Cholecystectomy clips.

Musculoskeletal: Thoracic and lower cervical spine degenerative
changes.

Review of the MIP images confirms the above findings.
IMPRESSION: 1. Small to moderate-sized non occluding right lower lobe pulmonary
embolism.
2. Extensive right lung probable pneumonia with an associated small
right pleural effusion.
3. No gross change in the previously demonstrated right upper lobe
mass compatible with the patient's known lung cancer.
4. Stable probable metastatic right hilar adenopathy.
5. Stable aneurysmal dilatation of the aortic arch, distal
descending thoracic aorta and proximal abdominal aorta. Recommend
followup by abdomen and pelvis CTA in 6 months, and vascular surgery
referral/consultation if not already obtained. This recommendation
follows ACR consensus guidelines: White Paper of the ACR Incidental
Findings Committee II on Vascular Findings. [HOSPITAL] 4698;
[DATE]. Aortic aneurysm NOS (UKXVM-ML2.W)
6. Stable mild changes of centrilobular emphysema.
7.  Calcific coronary artery and aortic atherosclerosis.

Critical Value/emergent results were called by telephone at the time
of interpretation on 03/06/2018 at [DATE] to Dr. IYALLA BARRAH , who
verbally acknowledged these results.

Aortic Atherosclerosis (UKXVM-PIH.H) and Emphysema (UKXVM-CDO.7).

## 2020-02-04 IMAGING — DX DG CHEST 1V PORT
1 series · 1 of 1 positions shown · non-contrast
Comparison: Single-view of the chest 01/31/2018. CT chest
01/01/2018.

CLINICAL DATA: Syncopal episode today.  History of lung cancer.

EXAM:
PORTABLE CHEST 1 VIEW

[chest]
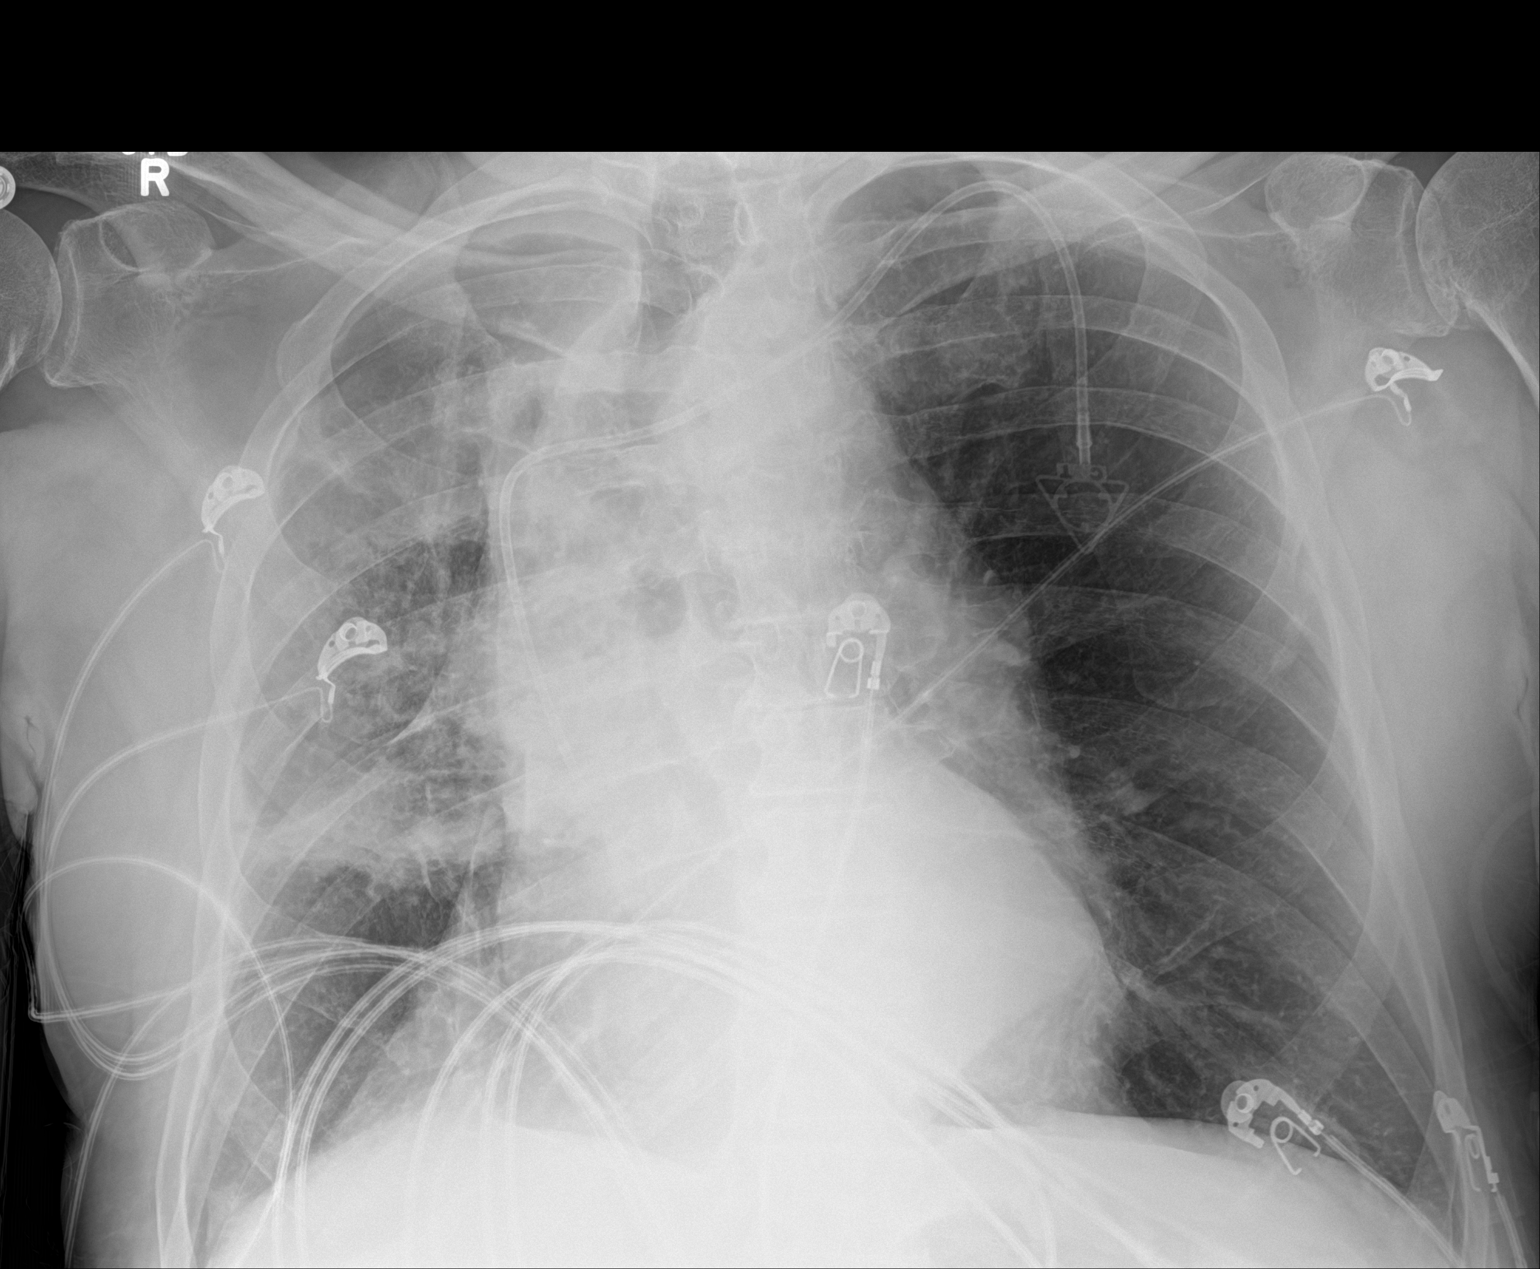

[1 of 1 positions shown; findings below may reference images not displayed]

FINDINGS: Port-A-Cath remains in place. The left lung is clear. There is
airspace disease throughout the right lung, worst in the mid and
upper lung zones. Aeration is improved compared to the most recent
examination. There is volume loss in the right chest. No
pneumothorax or pleural effusion. Right upper lobe mass lesion seen
on prior CT scan is identified. Heart size is normal. No acute bony
abnormality.
IMPRESSION: Airspace disease in the right chest is most notable in the mid and
upper lung zones and appears improved compared to the most recent
examination compatible with resolving pneumonia.

Right upper lobe mass lesion is noted but better seen on prior CT.

Emphysema.

## 2020-02-10 DIAGNOSIS — I779 Disorder of arteries and arterioles, unspecified: Secondary | ICD-10-CM | POA: Diagnosis not present

## 2020-02-10 DIAGNOSIS — C3411 Malignant neoplasm of upper lobe, right bronchus or lung: Secondary | ICD-10-CM | POA: Diagnosis not present

## 2020-02-10 DIAGNOSIS — I700 Atherosclerosis of aorta: Secondary | ICD-10-CM | POA: Diagnosis not present

## 2020-02-10 DIAGNOSIS — I712 Thoracic aortic aneurysm, without rupture: Secondary | ICD-10-CM | POA: Diagnosis not present

## 2020-02-10 DIAGNOSIS — K7689 Other specified diseases of liver: Secondary | ICD-10-CM | POA: Diagnosis not present

## 2020-02-10 DIAGNOSIS — Z85118 Personal history of other malignant neoplasm of bronchus and lung: Secondary | ICD-10-CM | POA: Diagnosis not present

## 2020-02-10 DIAGNOSIS — J439 Emphysema, unspecified: Secondary | ICD-10-CM | POA: Diagnosis not present

## 2020-02-10 DIAGNOSIS — I2510 Atherosclerotic heart disease of native coronary artery without angina pectoris: Secondary | ICD-10-CM | POA: Diagnosis not present

## 2020-02-11 NOTE — Progress Notes (Signed)
[facility]  [address] [place],    [address] [phone]  Clinic Day:  02/12/2020  Referring physician: Renaldo Reel, PA   This document serves as a record of services personally performed by Hosie Poisson, MD. It was created on their behalf by Curry,Lauren E, a trained medical scribe. The creation of this record is based on the scribe's personal observations and the provider's statements to them.   CHIEF COMPLAINT:  CC: Non-small cell lung cancer  Current Treatment:  Surveillance   HISTORY OF PRESENT ILLNESS:  John Maddox is a 80 y.o. male with stage IIIA (T3 N1 M0) non-small cell lung cancer diagnosed in September 2019.  He had presented with severe bradycardia and left chest discomfort and was found to have a 6.2 cm mass in the right upper lobe with 1 paratracheal node measuring 1-2 cm.  Bronchoscopy was done and and the 1st set of cytology and pathology results were negative.  Bronchial brushings and washings revealed rare atypical cells suspicious for malignancy in 1 specimen and malignant cells definitely consistent with non-small cell carcinoma of the lung, favoring adenocarcinoma, in a 2nd specimen.  We recommended concurrent chemoradiation with weekly carboplatin/paclitaxel.  He has many comorbidities including moderate emphysema, a distal descending thoracic aortic aneurysm measuring 4.4 cm, hypertension, congestive heart failure, diabetes mellitus, chronic kidney disease, multiple aortic aneurysms, peripheral artery disease, and history of prostate cancer 11 years ago.  His Cardiolite scan revealed mild global hypokinesis with an ejection fraction of 37%.  He also has pre-existing peripheral neuropathy.  MRI of the head did not reveal any evidence of intracranial metastasis.  He received 7 cycles of chemotherapy, which was completed in December 2019.  Unfortunately, the patient became quite ill and septic later in December  2019.  He was transferred from [facility] to [facility].  He was found to have septic arthritis of his knee and was placed on IV antibiotics at home with daptomycin and ceftriaxone as well as rifampin.  The hemoglobin was 8.0 when he was discharged and continued to drop to 7.3 and then 7.1.  We were eventually able to transfuse him as an inpatient.   When the patient went to see Dr. Linus Salmons of Infectious Disease in February 2020, he collapsed prior to getting his labs drawn and had cardiorespiratory arrest requiring resuscitation.  He was transported to the emergency room and admitted.  He was found to have acute pulmonary embolism of small to moderate size, but no significant right heart strain.  He had deep venous thrombosis of the right femoral vein, right popliteal vein and right posterior tibial vein.  He was placed on heparin IV and transitioned to apixaban 5 mg twice daily, which he continues.  He was also found to have aspiration pneumonia during that hospitalization. A CT chest revealed the spiculated mass in the posterior right upper lobe was slightly smaller measuring 3.8 cm in maximum diameter, decreased from 4.0 cm.  There was stable right hilar adenopathy measuring 1.4 cm.  There was a small right pleural effusion.  He started maintenance durvalumab in March 2020 and tolerated it well.  The patient had a repeat echocardiogram, which showed improvement in his ejection fraction.  CT chest in early June revealed decrease in the size of the right upper lobe mass to 2.4 cm, with decrease in the pleural effusion.  There was a stable thoracic aortic aneurysm measuring 4.7 cm.  When he was seen in  June, he reported intermittent dizziness, but MRI brain did not reveal intracranial metastasis or other acute abnormality.  Repeat CT chest in September 2020 revealed improvement in the size of the right upper lobe pulmonary nodule from 2.4 cm to 1.9 cm, with a possible right lower lobe  pneumonia.  CT chest in January 2021 revealed slight interval increase in dense, post treatment fibrotic consolidation and volume loss of the perihilar right upper lobe, which is consistent with maturation of radiation fibrosis.  The previously seen clustered nodularity of the right lower lobe was essentially resolved, consistent with resolved atypical infection or inflammation.  Atherosclerotic, tortuous and aneurysmal thoracic aorta, the distal arch measuring approximately 5.0 cm and the tortuous descending thoracic aorta focally aneurysmal and measuring approximately 5.1 cm in greatest caliber orthogonal to flow was re-demonstrated.  I recommended [employer] testing of the peripheral blood, and these results show no reportable alterations with companion diagnostic claims.  Other biomarkers with potential clinical significance, included DNMT3A and TET2.  He completed a total of 1 year of durvalumab therapy on March 10th.  CT chest in April revealed post treatment changes about the right hilum measuring 6.9 x 2.5 cm, previously 7.4 x 3.4 cm.  There were nodular changes in the left lung base developing along the margin of the aorta measuring 1.7 x 1.1 cm, more conspicuous than on the previous exam.  Stable appearance of the thoracic aorta with tortuosity and dilation.  The upper abdominal aortic aneurysm with mural thrombus is similar in appearance measuring 4.6 cm.  PET imaging from July 27th revealed a large are of treated tumor/radiation fibrosis involving the right lower lobe.  There was no hypermetabolism to suggest residual or recurrent tumor, no findings for mediastinal/hilar adenopathy or pulmonary metastatic disease.  There was no evidence of abdominal/pelvic metastatic disease or osseous metastatic disease.  Stable, severe vascular disease was seen.  The upper abdominal aorta measures 4.4 cm.    INTERVAL HISTORY:  John Maddox is here for routine follow up and states that he has been well.  CT chest with  contrast from January 26th revealed the right infrahilar soft tissue remains stable.  There is decrease in size of nodular focus at the left lung base measuring 13 x 10 mm, previously 17 x 10 mm.  He states that he has been doing fairly well.  His shortness of breath and lower extremity edema remain stable.  He comes into the clinic in a wheelchair today, but is able to move about around his house limitedly.  This is mainly due to his hips and knees.  His hemoglobin has increased from 11.7 to 12.5, and his white count and platelets are normal.  Chemistries are unremarkable.  His  appetite is good, and his weight is stable since his last visit.  He denies fever, chills or other signs of infection.  He denies nausea, vomiting, bowel issues, or abdominal pain.  He denies sore throat, cough, dyspnea, or chest pain.  REVIEW OF SYSTEMS:  Review of Systems  Constitutional: Negative.   HENT:  Negative.   Eyes: Negative.   Respiratory: Positive for cough (intermittent) and shortness of breath (stable). Negative for hemoptysis.   Cardiovascular: Positive for leg swelling. Negative for chest pain.  Gastrointestinal: Negative.   Endocrine: Negative.   Genitourinary: Negative.    Musculoskeletal: Positive for gait problem (limited by his knees and hips).  Skin: Negative.   Neurological: Positive for gait problem (limited by his knees and hips).  Hematological: Negative.  Psychiatric/Behavioral: Negative.   All other systems reviewed and are negative.    VITALS:  Blood pressure (!) 177/94, pulse 72, temperature 97.8 F (36.6 C), temperature source Oral, resp. rate 18, height 5\' 10"  (1.778 m), weight 255 lb 3.2 oz (115.8 kg), SpO2 96 %.  Wt Readings from Last 3 Encounters:  02/12/20 255 lb 3.2 oz (115.8 kg)  09/29/19 255 lb (115.7 kg)  03/31/19 250 lb (113.4 kg)    Body mass index is 36.62 kg/m.  Performance status (ECOG): 1 - Symptomatic but completely ambulatory  PHYSICAL EXAM:  Physical  Exam Constitutional:      General: He is not in acute distress.    Appearance: Normal appearance. He is normal weight.  HENT:     Head: Normocephalic and atraumatic.  Eyes:     General: No scleral icterus.    Extraocular Movements: Extraocular movements intact.     Conjunctiva/sclera: Conjunctivae normal.     Pupils: Pupils are equal, round, and reactive to light.  Cardiovascular:     Rate and Rhythm: Normal rate and regular rhythm.     Pulses: Normal pulses.     Heart sounds: Normal heart sounds. No murmur heard. No friction rub. No gallop.   Pulmonary:     Effort: Pulmonary effort is normal. No respiratory distress.     Breath sounds: Normal breath sounds.  Abdominal:     General: Bowel sounds are normal. There is no distension.     Palpations: Abdomen is soft. There is no mass.     Tenderness: There is no abdominal tenderness.  Musculoskeletal:        General: Normal range of motion.     Cervical back: Normal range of motion and neck supple.     Right lower leg: 1+ Edema present.     Left lower leg: 1+ Edema present.  Lymphadenopathy:     Cervical: No cervical adenopathy.  Skin:    General: Skin is warm and dry.  Neurological:     General: No focal deficit present.     Mental Status: He is alert and oriented to person, place, and time. Mental status is at baseline.  Psychiatric:        Mood and Affect: Mood normal.        Behavior: Behavior normal.        Thought Content: Thought content normal.        Judgment: Judgment normal.     LABS:   CBC Latest Ref Rng & Units 11/13/2019 03/07/2018 03/06/2018  WBC - 5.5 5.3 5.3  Hemoglobin 13.5 - 17.5 11.7(A) 9.2(L) 9.9(L)  Hematocrit 41 - 53 36(A) 30.6(L) 33.5(L)  Platelets 150 - 399 182 232 215   CMP Latest Ref Rng & Units 11/13/2019 03/07/2018 03/06/2018  Glucose 70 - 99 mg/dL - 113(H) 138(H)  BUN 4 - 21 14 6(L) 8  Creatinine 0.6 - 1.3 1.3 0.78 0.77  Sodium 137 - 147 141 140 140  Potassium 3.4 - 5.3 4.0 3.4(L) 3.6   Chloride 99 - 108 106 108 108  CO2 13 - 22 28(A) 24 21(L)  Calcium 8.7 - 10.7 9.3 8.7(L) 8.9  Alkaline Phos 25 - 125 116 - -  AST 14 - 40 37 - -  ALT 10 - 40 24 - -     No results found for: CEA1 / No results found for: CEA1   STUDIES:   He underwent CT chest with contrast on 02/10/2020 showing: 1. Stable RIGHT infrahilar soft tissue  at the site of previous treatment. 2. Decreased size of nodular focus at the LEFT lung base now measuring 13 x 10 mm as compared to 17 x 10 mm. Likely nodular scarring. Attention on follow-up. 3. Tortuous and aneurysmal appearance of the thoracoabdominal aorta with similar appearance. 4. Three-vessel coronary artery disease. 5. Emphysema and aortic atherosclerosis.  Allergies:  Allergies  Allergen Reactions  . Lantus [Insulin Glargine]     Stomach pain  . Quinolones Other (See Comments)    History of aortic aneurysm status post repair.  Use with caution    Current Medications: Current Outpatient Medications  Medication Sig Dispense Refill  . allopurinol (ZYLOPRIM) 300 MG tablet Take 300 mg by mouth daily.    [name] amitriptyline (ELAVIL) 50 MG tablet Take 50 mg by mouth as needed for sleep.     [name] apixaban (ELIQUIS) 5 MG TABS tablet Take 5 mg by mouth 2 (two) times daily.    . carvedilol (COREG) 3.125 MG tablet TAKE 1 TABLET TWICE A DAY 180 tablet 3  . celecoxib (CELEBREX) 200 MG capsule TAKE 1 CAPSULE TWICE A DAY 180 capsule 3  . ENTRESTO 97-103 MG TAKE 1 TABLET TWICE A DAY 180 tablet 2  . fluticasone-salmeterol (ADVAIR HFA) 115-21 MCG/ACT inhaler Inhale 2 puffs into the lungs 2 (two) times daily.     [name] gabapentin (NEURONTIN) 100 MG capsule Take 100 mg by mouth 2 (two) times daily.    . metFORMIN (GLUCOPHAGE) 500 MG tablet Take 500 mg by mouth daily with breakfast. 2 tablets daily    . Multiple Vitamin (MULTI-VITAMINS) TABS Take 1 tablet by mouth daily.    [name] omeprazole (PRILOSEC) 20 MG capsule Take 20 mg by mouth daily.    [name] oxyCODONE (OXY  IR/ROXICODONE) 5 MG immediate release tablet Take 1 tablet (5 mg total) by mouth every 6 (six) hours as needed for severe pain. 120 tablet 0  . PROAIR HFA 108 (90 Base) MCG/ACT inhaler Inhale 1 puff into the lungs every 6 (six) hours as needed.     . simvastatin (ZOCOR) 40 MG tablet Take 40 mg by mouth daily.    [name] tiotropium (SPIRIVA HANDIHALER) 18 MCG inhalation capsule Place 18 mcg into inhaler and inhale daily.     [name] torsemide (DEMADEX) 20 MG tablet TAKE 1 TABLET DAILY 90 tablet 3   No current facility-administered medications for this visit.     ASSESSMENT & PLAN:   Assessment:   1. Stage IIIA non-small cell lung cancer with partial response to concurrent chemoradiation.  He completed 1 year of durvalumab on March 10th and tolerated this without significant difficulty.  CT imaging in April revealed continued improvement.  PET scan from July 2021 revealed a large area of treated tumor/radiation fibrosis involving the right lower lobe with no hypermetabolism to suggest residual or recurrent tumor.  No evidence of metastatic disease was observed.  The areas on his CT scan remain stable.  2. Congestive heart failure, which appears controlled with medication.    3. Mild anemia, felt to be multifactorial, improved.  4. Severe COPD.  5. Chronic kidney disease, which is stable.  6. History of prostate cancer.  7. History of septic arthritis of the knee, resolved.  He does not have symptoms of recurrence.    8. Chronic pain, controlled with the current regimen.    9. History of pulmonary embolism and deep venous thrombosis of the right lower extremity in February 2020. He remains on apixaban.  10. Aspiration pneumonia in  February 2020. The nodular area in the right lower lobe seen CT and felt to be infectious in nature has essentially resolved.    11. Cardiorespiratory arrest in February 2020, successfully resuscitated.  12. Multiple aortic aneurysms, including a suprarenal aortic  aneurysm with a stent in the infrarenal aorta seen on previous CT abdomen and pelvis.  These were stable on the most recent CT imaging.  Unfortunately, we do not feel the patient would be a candidate for surgery.  Plan: CT imaging revealed stable right infrahilar soft tissue at the site of previous treatment, and decreased size of the nodular focus at the left lung base, now measuring 13 x 10 mm as compared to 17 x 10 mm. Likely nodular scarring.  We will plan to see him back in 3 months with a CBC, comprehensive metabolic panel, and CEA for examination.  We will repeat CT imaging in 9 months.  The patient and his wife understand the plans discussed today and are in agreement with them.  They know to contact our office if he develops issues requiring immediate clinical assessment.   I provided 20 minutes of face-to-face time during this this encounter and > 50% was spent counseling as documented under my assessment and plan.    Derwood Kaplan, MD [facility] [address] [place] [address] Dept: [phone] Dept Fax: [phone]   I, Rita Ohara, am acting as scribe for Derwood Kaplan, MD  I have reviewed this report as typed by the medical scribe, and it is complete and accurate.

## 2020-02-12 ENCOUNTER — Telehealth: Payer: Self-pay | Admitting: Oncology

## 2020-02-12 ENCOUNTER — Other Ambulatory Visit: Payer: Self-pay | Admitting: Oncology

## 2020-02-12 ENCOUNTER — Encounter: Payer: Self-pay | Admitting: Oncology

## 2020-02-12 ENCOUNTER — Other Ambulatory Visit: Payer: Self-pay

## 2020-02-12 ENCOUNTER — Inpatient Hospital Stay: Payer: Medicare Other | Attending: Oncology | Admitting: Oncology

## 2020-02-12 VITALS — BP 177/94 | HR 72 | Temp 97.8°F | Resp 18 | Ht 70.0 in | Wt 255.2 lb

## 2020-02-12 DIAGNOSIS — C3491 Malignant neoplasm of unspecified part of right bronchus or lung: Secondary | ICD-10-CM

## 2020-02-12 DIAGNOSIS — C3411 Malignant neoplasm of upper lobe, right bronchus or lung: Secondary | ICD-10-CM

## 2020-02-12 NOTE — Telephone Encounter (Signed)
Per 1/28 LOS, patient scheduled for April Appt's.  Gave patient Appt Summary

## 2020-02-15 ENCOUNTER — Encounter: Payer: Self-pay | Admitting: Hematology and Oncology

## 2020-02-18 DIAGNOSIS — E669 Obesity, unspecified: Secondary | ICD-10-CM | POA: Diagnosis not present

## 2020-02-18 DIAGNOSIS — J449 Chronic obstructive pulmonary disease, unspecified: Secondary | ICD-10-CM | POA: Diagnosis not present

## 2020-02-22 ENCOUNTER — Other Ambulatory Visit: Payer: Self-pay | Admitting: Cardiology

## 2020-03-03 ENCOUNTER — Other Ambulatory Visit: Payer: Self-pay

## 2020-03-03 DIAGNOSIS — M1711 Unilateral primary osteoarthritis, right knee: Secondary | ICD-10-CM

## 2020-03-03 MED ORDER — OXYCODONE HCL 5 MG PO TABS: 5.0000 mg | ORAL_TABLET | Freq: Four times a day (QID) | ORAL | 0 refills | Status: DC | PRN

## 2020-03-10 ENCOUNTER — Other Ambulatory Visit: Payer: Self-pay | Admitting: Cardiology

## 2020-03-10 NOTE — Telephone Encounter (Signed)
Refill sent to pharmacy.   

## 2020-03-11 DIAGNOSIS — I5040 Unspecified combined systolic (congestive) and diastolic (congestive) heart failure: Secondary | ICD-10-CM | POA: Diagnosis not present

## 2020-03-11 DIAGNOSIS — J9611 Chronic respiratory failure with hypoxia: Secondary | ICD-10-CM | POA: Diagnosis not present

## 2020-03-11 DIAGNOSIS — I10 Essential (primary) hypertension: Secondary | ICD-10-CM | POA: Diagnosis not present

## 2020-03-11 DIAGNOSIS — E1169 Type 2 diabetes mellitus with other specified complication: Secondary | ICD-10-CM | POA: Diagnosis not present

## 2020-03-11 DIAGNOSIS — E785 Hyperlipidemia, unspecified: Secondary | ICD-10-CM | POA: Diagnosis not present

## 2020-03-11 DIAGNOSIS — Z86711 Personal history of pulmonary embolism: Secondary | ICD-10-CM | POA: Diagnosis not present

## 2020-03-11 DIAGNOSIS — G620 Drug-induced polyneuropathy: Secondary | ICD-10-CM | POA: Diagnosis not present

## 2020-03-11 DIAGNOSIS — J449 Chronic obstructive pulmonary disease, unspecified: Secondary | ICD-10-CM | POA: Diagnosis not present

## 2020-03-11 DIAGNOSIS — Z85118 Personal history of other malignant neoplasm of bronchus and lung: Secondary | ICD-10-CM | POA: Diagnosis not present

## 2020-03-11 DIAGNOSIS — T451X5A Adverse effect of antineoplastic and immunosuppressive drugs, initial encounter: Secondary | ICD-10-CM | POA: Diagnosis not present

## 2020-03-11 DIAGNOSIS — Z6836 Body mass index (BMI) 36.0-36.9, adult: Secondary | ICD-10-CM | POA: Diagnosis not present

## 2020-03-11 DIAGNOSIS — M109 Gout, unspecified: Secondary | ICD-10-CM | POA: Diagnosis not present

## 2020-03-31 ENCOUNTER — Other Ambulatory Visit: Payer: Self-pay

## 2020-03-31 ENCOUNTER — Encounter: Payer: Self-pay | Admitting: Cardiology

## 2020-03-31 ENCOUNTER — Ambulatory Visit (INDEPENDENT_AMBULATORY_CARE_PROVIDER_SITE_OTHER): Payer: Medicare Other | Admitting: Cardiology

## 2020-03-31 VITALS — BP 136/72 | HR 75 | Ht 70.0 in | Wt 250.6 lb

## 2020-03-31 DIAGNOSIS — I5042 Chronic combined systolic (congestive) and diastolic (congestive) heart failure: Secondary | ICD-10-CM | POA: Diagnosis not present

## 2020-03-31 DIAGNOSIS — C3490 Malignant neoplasm of unspecified part of unspecified bronchus or lung: Secondary | ICD-10-CM

## 2020-03-31 DIAGNOSIS — I110 Hypertensive heart disease with heart failure: Secondary | ICD-10-CM

## 2020-03-31 DIAGNOSIS — G8929 Other chronic pain: Secondary | ICD-10-CM

## 2020-03-31 DIAGNOSIS — Z7901 Long term (current) use of anticoagulants: Secondary | ICD-10-CM | POA: Diagnosis not present

## 2020-03-31 DIAGNOSIS — M549 Dorsalgia, unspecified: Secondary | ICD-10-CM

## 2020-03-31 MED ORDER — TORSEMIDE 20 MG PO TABS: 20.0000 mg | ORAL_TABLET | Freq: Every day | ORAL | 3 refills | Status: DC

## 2020-03-31 MED ORDER — OXYCODONE HCL 5 MG PO TABS: 5.0000 mg | ORAL_TABLET | Freq: Four times a day (QID) | ORAL | 0 refills | Status: DC | PRN

## 2020-03-31 NOTE — Progress Notes (Signed)
Cardiology Office Note:    Date:  03/31/2020   ID:  John Maddox, DOB 24-Oct-1940, MRN [id number]  PCP:  Renaldo Reel, PA  Cardiologist:  Shirlee More, MD    Referring MD: Renaldo Reel, [place]    ASSESSMENT:    1. Chronic combined systolic and diastolic heart failure ([place])   2. Hypertensive heart disease with heart failure ([place])   3. Non-small cell lung cancer, unspecified laterality ([place])   4. Chronic anticoagulation    PLAN:    In order of problems listed above:  1. He is fluid overloaded he will increase his diuretic dosage recheck echocardiogram and continue guideline directed therapy including carvedilol and Entresto he is not on MRA with his CKD. 2. Doing well from a cancer perspective he is being observed and followed along with his thoracic aortic aneurysm descending thoracic but stable on serial imaging 3. Continue his anticoagulants with massive PE PEA arrest unprovoked in the setting of lung cancer   Next appointment: 6 months   Medication Adjustments/Labs and Tests Ordered: Current medicines are reviewed at length with the patient today.  Concerns regarding medicines are outlined above.  No orders of the defined types were placed in this encounter.  No orders of the defined types were placed in this encounter.   Chief Complaint  Patient presents with  . Follow-up  . Cardiomyopathy    History of Present Illness:    John Maddox is a 80 y.o. male with a hx of heart failure mildly reduced ejection fraction February 2021 45 to 50%, venous thromboembolism with long-term anticoagulation non-small cell lung cancer hypertensive heart disease with heart failure and hyper lipidemia.  He was last seen 09/29/2019.  He had a massive pulmonary embolism with a witnessed PEA arrest February 2020 with right lower extremity deep vein thrombosis.  At that time his ejection fraction was 37% by gated pool.  He follows a [facility] pulmonary for severe  COPD and oncology [facility] for his lung cancer.  He has a history of distal thoracic aortic aneurysm and CKD age 33.  Compliance with diet, lifestyle and medications: Yes  Chest CT with and without contrast ordered by the oncology team 02/10/2020 shows stable appearance of lung cancer at site of treatment tortuous and aneurysmal appearance of the thoracoabdominal aorta unchanged emphysema and coronary artery calcification.  Distal thoracic aorta up to 5 cm.  In terms of his cancer he is stable functionally he has chronic shortness of breath due to COPD but he is able to care for himself uses daytime oxygen as needed but he and his wife both know he has increase his edema.  I need to recheck an echocardiogram to follow his ejection fraction which had improved increase his diuretics and continue his current guideline directed therapy including carvedilol and high-dose Entresto.  He has had no palpitation or syncope.  He remains anticoagulated without interruption Past Medical History:  Diagnosis Date  . AAA (abdominal aortic aneurysm) without rupture ([place]) 08/03/2015  . AKI (acute kidney injury) ([place]) 01/08/2018   Last Assessment & Plan:  Improved Nephrology consulted Likely 2/2 ATNand CINin the setting of contrast at the outside hospital on 12/21, NSAIDs at the outside hospital, intra-operative hypotension, and entresto use. Avoid nephrotoxic medications(entresto/diuretic) Renally dose medications Strict I&O Monitor creatinine(1.68 today)   . Anemia 01/17/2018   Last Assessment & Plan:  Due to chemotherapy and anemia of chronic disease Iron panel shows anemia of chronic disease Follow up  with PCP and oncologist  . Arrhythmia   . Asthma 09/23/2017  . Atherosclerosis of native artery of both lower extremities ([place]) 08/03/2015  . Bilateral carotid artery stenosis 08/03/2015  . Cardiomyopathy ([place]) 11/18/2017  . Chronic anticoagulation 06/05/2018  . Chronic combined systolic and diastolic heart  failure ([place]) 10/03/2017  . Congestive heart failure ([place]) 09/23/2017  . COPD (chronic obstructive pulmonary disease) ([place]) 09/23/2017  . Diabetes mellitus ([place]) 09/23/2017  . Diarrhea 01/14/2018   Last Assessment & Plan:  C diff - negative  GIP - negative Resolved  . Diastolic dysfunction 2/0/6015  . Duodenal nodule 11/10/2014  . Esophageal reflux 09/23/2017  . Essential hypertension 11/10/2014   Last Assessment & Plan:  Controlled off of medication Follow up with PCP as an outpatient  . Fatigue 09/23/2017  . Gout 09/23/2017  . Hematuria 01/14/2018   Last Assessment & Plan:  Need UA as outpatient.  . History of prostate cancer 09/23/2017  . Hyperlipidemia 09/23/2017  . Hypertension 09/23/2017  . Hypertensive heart disease 09/23/2017  . Hypertensive heart disease with heart failure ([place]) 06/06/2018  . Hypokalemia 01/14/2018   Last Assessment & Plan:  Replace and follow up with PCP  . Hypomagnesemia 01/14/2018   Last Assessment & Plan:  Replace and monitor and follow up with PCP  . Hypophosphatemia 01/15/2018   Last Assessment & Plan:  Resolved  . Infected prosthetic knee joint, initial encounter ([place]) 03/06/2018  . Insulin dependent type 2 diabetes mellitus ([place]) 11/10/2014   Last Assessment & Plan:  Glucose 109-140 Continue ISS Monitor glucose  . Left ventricular dysfunction 10/03/2017  . Lung cancer ([place])   . Lung mass 10/04/2017  . Non-small cell lung cancer ([place]) 10/03/2017  . Pulmonary emboli ([place]) 03/06/2018  . Pulmonary embolism ([place])   . PVC's (premature ventricular contractions) 09/23/2017  . PVD (peripheral vascular disease) ([place]) 11/10/2014  . Septic arthritis of knee, left ([place]) 01/04/2018   Added automatically from request for surgery [id number]  Last Assessment & Plan:  Prosthetic knee infection S/p wash out Culture negative  Continue Dapto + Ceftriaxone + Rifampin for 4 weeks(currently day 7) ID was notified today prior to discharge He will need CBC, CMP, ESR, CRP weekly faxed to [phone]([facility]AT  with ID) ID outpatient appointment is on 02/17/2017  Antibiotics administered by port by ho  . Thrombocytopenia ([place]) 01/15/2018   Last Assessment & Plan:  Decreased to 78.  Due to chemotherapy Peripheral smear negative for schistocytes LDH 211(within normal limits), haptoglobin 329(elevated), Retic 1.7 B12=714, Folate=20, copper pending    Past Surgical History:  Procedure Laterality Date  . ABDOMINAL AORTIC ANEURYSM REPAIR    . AORTA - ILIAC ARTERY BYPASS GRAFT    . BACK SURGERY    . CATARACT EXTRACTION, BILATERAL    . CHOLECYSTECTOMY    . HEMICOLECTOMY    . HERNIA REPAIR    . KNEE SURGERY Left    replaced all the hardwear  . PROSTATE SURGERY    . REPLACEMENT TOTAL KNEE    . STENT PLACEMENT VASCULAR ([place] HX)    . TONSILLECTOMY      Current Medications: Current Meds  Medication Sig  . allopurinol (ZYLOPRIM) 300 MG tablet Take 300 mg by mouth daily.  [name] apixaban (ELIQUIS) 5 MG TABS tablet Take 5 mg by mouth 2 (two) times daily.  . carvedilol (COREG) 3.125 MG tablet TAKE 1 TABLET TWICE A DAY  . celecoxib (CELEBREX) 200 MG capsule TAKE 1 CAPSULE TWICE A DAY  . ENTRESTO 97-103  MG TAKE 1 TABLET TWICE A DAY  . fluticasone-salmeterol (ADVAIR HFA) 115-21 MCG/ACT inhaler Inhale 2 puffs into the lungs 2 (two) times daily.   [name] gabapentin (NEURONTIN) 100 MG capsule Take 100 mg by mouth 2 (two) times daily.  . metFORMIN (GLUCOPHAGE) 500 MG tablet Take 500 mg by mouth daily with breakfast. 2 tablets daily  . Multiple Vitamin (MULTI-VITAMINS) TABS Take 1 tablet by mouth daily.  [name] omeprazole (PRILOSEC) 20 MG capsule Take 20 mg by mouth daily.  [name] oxyCODONE (OXY IR/ROXICODONE) 5 MG immediate release tablet Take 1 tablet (5 mg total) by mouth every 6 (six) hours as needed for severe pain.  [name] PROAIR HFA 108 (90 Base) MCG/ACT inhaler Inhale 1 puff into the lungs every 6 (six) hours as needed.   . simvastatin (ZOCOR) 40 MG tablet Take 40 mg by mouth daily.  [name] tiotropium (SPIRIVA) 18 MCG inhalation  capsule Place 18 mcg into inhaler and inhale daily.   [name] torsemide (DEMADEX) 20 MG tablet TAKE 1 TABLET DAILY     Allergies:   Lantus [insulin glargine] and Quinolones   Social History   Socioeconomic History  . Marital status: Married    Spouse name: Not on file  . Number of children: Not on file  . Years of education: Not on file  . Highest education level: Not on file  Occupational History  . Not on file  Tobacco Use  . Smoking status: Former Smoker    Packs/day: 2.50    Years: 40.00    Pack years: 100.00    Types: Cigarettes    Quit date: 2014    Years since quitting: 8.2  . Smokeless tobacco: Never Used  Vaping Use  . Vaping Use: Never used  Substance and Sexual Activity  . Alcohol use: Not Currently  . Drug use: Not Currently  . Sexual activity: Not on file  Other Topics Concern  . Not on file  Social History Narrative  . Not on file   Social Determinants of Health   Financial Resource Strain: Not on file  Food Insecurity: Not on file  Transportation Needs: Not on file  Physical Activity: Not on file  Stress: Not on file  Social Connections: Not on file     Family History: The patient's family history includes Alcohol abuse in his father; Arthritis in his brother, father, and mother; Heart attack in his father; Heart disease in his father; Hyperlipidemia in his father; Hypertension in his father and mother; Lung cancer in his brother and mother; Prostate cancer in his father. ROS:   Please see the history of present illness.    All other systems reviewed and are negative.  EKGs/Labs/Other Studies Reviewed:    The following studies were reviewed today:  EKG:  EKG ordered today and personally reviewed.  The ekg ordered today demonstrates sinus rhythm left axis deviation first-degree AV block otherwise normal EKG  Recent Labs: 11/13/2019: ALT 24; BUN 14; Creatinine 1.3; Hemoglobin 11.7; Platelets 182; Potassium 4.0; Sodium 141  Recent Lipid  Panel 03/11/2020 cholesterol 121 LDL 60 triglycerides 147 HDL 35 creatinine 1.4  Physical Exam:    VS:  BP 136/72   Pulse 75   Ht '5\' 10"'  (1.778 m)   Wt 250 lb 9.6 oz (113.7 kg)   SpO2 96%   BMI 35.96 kg/m     Wt Readings from Last 3 Encounters:  03/31/20 250 lb 9.6 oz (113.7 kg)  02/12/20 255 lb 3.2 oz (115.8 kg)  09/29/19 255  lb (115.7 kg)     GEN: COPD appearance in no acute distress HEENT: Normal NECK: No JVD; No carotid bruits LYMPHATICS: No lymphadenopathy CARDIAC: RRR, no murmurs, rubs, gallops distant heart sounds RESPIRATORY: Hyperinflated diminished breath sounds without rales, wheezing or rhonchi  ABDOMEN: Soft, non-tender, non-distended MUSCULOSKELETAL: 2+ above-the-knee pitting edema; No deformity  SKIN: Warm and dry NEUROLOGIC:  Alert and oriented x 3 PSYCHIATRIC:  Normal affect    Signed, Shirlee More, MD  03/31/2020 1:36 PM    [facility]

## 2020-03-31 NOTE — Patient Instructions (Signed)
Medication Instructions:  Your physician has recommended you make the following change in your medication:  INCREASE: Torsemide 20 mg take an extra tablet by mouth at noon on Monday, Wednesday, and Friday.  *If you need a refill on your cardiac medications before your next appointment, please call your pharmacy*   Lab Work: None If you have labs (blood work) drawn today and your tests are completely normal, you will receive your results only by: [name] MyChart Message (if you have MyChart) OR . A paper copy in the mail If you have any lab test that is abnormal or we need to change your treatment, we will call you to review the results.   Testing/Procedures: Your physician has requested that you have an echocardiogram. Echocardiography is a painless test that uses sound waves to create images of your heart. It provides your doctor with information about the size and shape of your heart and how well your heart's chambers and valves are working. This procedure takes approximately one hour. There are no restrictions for this procedure.     Follow-Up: At [facility], you and your health needs are our priority.  As part of our continuing mission to provide you with exceptional heart care, we have created designated Provider Care Teams.  These Care Teams include your primary Cardiologist (physician) and Advanced Practice Providers (APPs -  Physician Assistants and Nurse Practitioners) who all work together to provide you with the care you need, when you need it.  We recommend signing up for the patient portal called "MyChart".  Sign up information is provided on this After Visit Summary.  MyChart is used to connect with patients for Virtual Visits (Telemedicine).  Patients are able to view lab/test results, encounter notes, upcoming appointments, etc.  Non-urgent messages can be sent to your provider as well.   To learn more about what you can do with MyChart, go to [email or web address].    Your  next appointment:   6 month(s)  The format for your next appointment:   In Person  Provider:   Shirlee More, MD   Other Instructions

## 2020-04-21 ENCOUNTER — Ambulatory Visit (INDEPENDENT_AMBULATORY_CARE_PROVIDER_SITE_OTHER): Payer: Medicare Other

## 2020-04-21 ENCOUNTER — Other Ambulatory Visit: Payer: Self-pay

## 2020-04-21 DIAGNOSIS — I5042 Chronic combined systolic (congestive) and diastolic (congestive) heart failure: Secondary | ICD-10-CM

## 2020-04-21 LAB — ECHOCARDIOGRAM COMPLETE
Area-P 1/2: 2.85 cm2
Calc EF: 39.3 %
S' Lateral: 4.2 cm
Single Plane A2C EF: 40.4 %
Single Plane A4C EF: 38.2 %

## 2020-04-21 NOTE — Progress Notes (Signed)
Complete echocardiogram performed.  Jimmy Dolton Shaker RDCS, RVT  

## 2020-04-22 ENCOUNTER — Telehealth: Payer: Self-pay

## 2020-04-22 NOTE — Telephone Encounter (Signed)
Patient wife notified of test results

## 2020-04-22 NOTE — Telephone Encounter (Signed)
-----   Message from Richardo Priest, MD sent at 04/22/2020 12:34 PM EDT ----- Despite his recent fluid overload this is a good result his heart muscle function remains normal.  No changes in treatment

## 2020-05-02 ENCOUNTER — Other Ambulatory Visit: Payer: Self-pay | Admitting: Hematology and Oncology

## 2020-05-02 ENCOUNTER — Other Ambulatory Visit: Payer: Self-pay

## 2020-05-02 DIAGNOSIS — M549 Dorsalgia, unspecified: Secondary | ICD-10-CM

## 2020-05-02 DIAGNOSIS — G8929 Other chronic pain: Secondary | ICD-10-CM

## 2020-05-02 MED ORDER — OXYCODONE HCL 5 MG PO TABS: 5.0000 mg | ORAL_TABLET | Freq: Four times a day (QID) | ORAL | 0 refills | Status: DC | PRN

## 2020-05-12 ENCOUNTER — Other Ambulatory Visit: Payer: Self-pay

## 2020-05-12 ENCOUNTER — Inpatient Hospital Stay (INDEPENDENT_AMBULATORY_CARE_PROVIDER_SITE_OTHER): Payer: Medicare Other | Admitting: Hematology and Oncology

## 2020-05-12 ENCOUNTER — Other Ambulatory Visit: Payer: Self-pay | Admitting: Hematology and Oncology

## 2020-05-12 ENCOUNTER — Telehealth: Payer: Self-pay | Admitting: Hematology and Oncology

## 2020-05-12 ENCOUNTER — Inpatient Hospital Stay: Payer: Medicare Other | Attending: Oncology

## 2020-05-12 VITALS — BP 138/78 | HR 73 | Temp 98.3°F | Resp 18 | Ht 70.0 in | Wt 247.8 lb

## 2020-05-12 DIAGNOSIS — D696 Thrombocytopenia, unspecified: Secondary | ICD-10-CM | POA: Diagnosis not present

## 2020-05-12 DIAGNOSIS — C3491 Malignant neoplasm of unspecified part of right bronchus or lung: Secondary | ICD-10-CM

## 2020-05-12 DIAGNOSIS — D649 Anemia, unspecified: Secondary | ICD-10-CM | POA: Diagnosis not present

## 2020-05-12 LAB — BASIC METABOLIC PANEL
BUN: 22 — AB (ref 4–21)
CO2: 29 — AB (ref 13–22)
Chloride: 104 (ref 99–108)
Creatinine: 1.1 (ref 0.6–1.3)
Glucose: 119
Potassium: 4.1 (ref 3.4–5.3)
Sodium: 139 (ref 137–147)

## 2020-05-12 LAB — HEPATIC FUNCTION PANEL
ALT: 20 (ref 10–40)
AST: 24 (ref 14–40)
Alkaline Phosphatase: 135 — AB (ref 25–125)
Bilirubin, Total: 0.5

## 2020-05-12 LAB — COMPREHENSIVE METABOLIC PANEL
Albumin: 3.7 (ref 3.5–5.0)
Calcium: 8.7 (ref 8.7–10.7)

## 2020-05-12 LAB — CBC AND DIFFERENTIAL
HCT: 37 — AB (ref 41–53)
Hemoglobin: 12.1 — AB (ref 13.5–17.5)
Neutrophils Absolute: 2.88
Platelets: 165 (ref 150–399)
WBC: 4.8

## 2020-05-12 LAB — CBC
MCV: 90 (ref 80–94)
RBC: 4.13 (ref 3.87–5.11)

## 2020-05-12 LAB — PROTEIN, TOTAL: Total Protein: 6 g/dL — AB (ref 6.3–8.52)

## 2020-05-12 LAB — CORRECTED CALCIUM (CC13): Calcium, Corrected: 9

## 2020-05-12 NOTE — Telephone Encounter (Signed)
Per 4/28 los next appt scheduled and given to patient

## 2020-05-12 NOTE — Progress Notes (Signed)
[facility]  [address] [place],  [place]  [address] [phone]  Clinic Day:  05/19/2020  Referring physician: Renaldo Reel, PA    CHIEF COMPLAINT:  CC: A 80 year old male with history of non-small cell lung cancer here for 3 month evaluation.  Current Treatment:  Surveillance   HISTORY OF PRESENT ILLNESS:  John Maddox. John Maddox is a 80 y.o. male with stage IIIA (T3 N1 M0) non-small cell lung cancer diagnosed in September 2019.  He had presented with severe bradycardia and left chest discomfort and was found to have a 6.2 cm mass in the right upper lobe with 1 paratracheal node measuring 1-2 cm.  Bronchoscopy was done and and the 1st set of cytology and pathology results were negative.  Bronchial brushings and washings revealed rare atypical cells suspicious for malignancy in 1 specimen and malignant cells definitely consistent with non-small cell carcinoma of the lung, favoring adenocarcinoma, in a 2nd specimen.  We recommended concurrent chemoradiation with weekly carboplatin/paclitaxel.  He has many comorbidities including moderate emphysema, a distal descending thoracic aortic aneurysm measuring 4.4 cm, hypertension, congestive heart failure, diabetes mellitus, chronic kidney disease, multiple aortic aneurysms, peripheral artery disease, and history of prostate cancer 11 years ago.  His Cardiolite scan revealed mild global hypokinesis with an ejection fraction of 37%.  He also has pre-existing peripheral neuropathy.  MRI of the head did not reveal any evidence of intracranial metastasis.  He received 7 cycles of chemotherapy, which was completed in December 2019.  Unfortunately, the patient became quite ill and septic later in December 2019.  He was transferred from [facility] to [facility].  He was found to have septic arthritis of his knee and was placed on IV antibiotics at home with daptomycin and ceftriaxone as well as rifampin.   The hemoglobin was 8.0 when he was discharged and continued to drop to 7.3 and then 7.1.  We were eventually able to transfuse him as an inpatient.   When the patient went to see Dr. Linus Salmons of Infectious Disease in February 2020, he collapsed prior to getting his labs drawn and had cardiorespiratory arrest requiring resuscitation.  He was transported to the emergency room and admitted.  He was found to have acute pulmonary embolism of small to moderate size, but no significant right heart strain.  He had deep venous thrombosis of the right femoral vein, right popliteal vein and right posterior tibial vein.  He was placed on heparin IV and transitioned to apixaban 5 mg twice daily, which he continues.  He was also found to have aspiration pneumonia during that hospitalization. A CT chest revealed the spiculated mass in the posterior right upper lobe was slightly smaller measuring 3.8 cm in maximum diameter, decreased from 4.0 cm.  There was stable right hilar adenopathy measuring 1.4 cm.  There was a small right pleural effusion.  He started maintenance durvalumab in March 2020 and tolerated it well.  The patient had a repeat echocardiogram, which showed improvement in his ejection fraction.  CT chest in early June revealed decrease in the size of the right upper lobe mass to 2.4 cm, with decrease in the pleural effusion.  There was a stable thoracic aortic aneurysm measuring 4.7 cm.  When he was seen in June, he reported intermittent dizziness, but MRI brain did not reveal intracranial metastasis or other acute abnormality.  Repeat CT chest in September 2020 revealed improvement in the size of the right upper lobe  pulmonary nodule from 2.4 cm to 1.9 cm, with a possible right lower lobe pneumonia.  CT chest in January 2021 revealed slight interval increase in dense, post treatment fibrotic consolidation and volume loss of the perihilar right upper lobe, which is consistent with maturation of radiation fibrosis.  The  previously seen clustered nodularity of the right lower lobe was essentially resolved, consistent with resolved atypical infection or inflammation.  Atherosclerotic, tortuous and aneurysmal thoracic aorta, the distal arch measuring approximately 5.0 cm and the tortuous descending thoracic aorta focally aneurysmal and measuring approximately 5.1 cm in greatest caliber orthogonal to flow was re-demonstrated.  I recommended [employer] testing of the peripheral blood, and these results show no reportable alterations with companion diagnostic claims.  Other biomarkers with potential clinical significance, included DNMT3A and TET2.  He completed a total of 1 year of durvalumab therapy on March 10th.  CT chest in April revealed post treatment changes about the right hilum measuring 6.9 x 2.5 cm, previously 7.4 x 3.4 cm.  There were nodular changes in the left lung base developing along the margin of the aorta measuring 1.7 x 1.1 cm, more conspicuous than on the previous exam.  Stable appearance of the thoracic aorta with tortuosity and dilation.  The upper abdominal aortic aneurysm with mural thrombus is similar in appearance measuring 4.6 cm.  PET imaging from July 27th revealed a large are of treated tumor/radiation fibrosis involving the right lower lobe.  There was no hypermetabolism to suggest residual or recurrent tumor, no findings for mediastinal/hilar adenopathy or pulmonary metastatic disease.  There was no evidence of abdominal/pelvic metastatic disease or osseous metastatic disease.  Stable, severe vascular disease was seen.  The upper abdominal aorta measures 4.4 cm.    INTERVAL HISTORY:  John Maddox is here for 3 month evaluation of his lung cancerand states that he has been well.  He denies fever, chills, nausea or vomiting. He denies shortness of breath, chest pain or cough. He denies issue with bowel or bladder. His appetite is fair and his weight is stable. He continues to see his PCP for medical  concerns. CBC and CMP are unremarkable.  REVIEW OF SYSTEMS:  Review of Systems  Constitutional: Negative for appetite change, chills, diaphoresis, fatigue, fever and unexpected weight change.  HENT:   Negative for hearing loss, lump/mass, mouth sores, nosebleeds, sore throat, tinnitus, trouble swallowing and voice change.   Eyes: Negative for eye problems and icterus.  Respiratory: Negative for chest tightness, hemoptysis and wheezing. Cough: intermittent. Shortness of breath: stable.   Cardiovascular: Negative for chest pain and palpitations.  Gastrointestinal: Negative for abdominal distention, abdominal pain, blood in stool, constipation, diarrhea, nausea, rectal pain and vomiting.  Endocrine: Negative for hot flashes.  Genitourinary: Negative for bladder incontinence, difficulty urinating, dyspareunia, dysuria, frequency, hematuria and nocturia.   Musculoskeletal: Positive for gait problem (limited by his knees and hips). Negative for arthralgias, back pain, flank pain, myalgias, neck pain and neck stiffness.  Skin: Negative for itching, rash and wound.  Neurological: Positive for gait problem (limited by his knees and hips). Negative for dizziness, extremity weakness, headaches, light-headedness, numbness, seizures and speech difficulty.  Hematological: Negative for adenopathy. Does not bruise/bleed easily.  Psychiatric/Behavioral: Negative for confusion, decreased concentration, depression, sleep disturbance and suicidal ideas. The patient is not nervous/anxious.   All other systems reviewed and are negative.    VITALS:  Blood pressure 138/78, pulse 73, temperature 98.3 F (36.8 C), temperature source Oral, resp. rate 18, height 5\' 10"  (1.778 m),  weight 247 lb 12.8 oz (112.4 kg), SpO2 96 %.  Wt Readings from Last 3 Encounters:  05/12/20 247 lb 12.8 oz (112.4 kg)  03/31/20 250 lb 9.6 oz (113.7 kg)  02/12/20 255 lb 3.2 oz (115.8 kg)    Body mass index is 35.56 kg/m.  Performance  status (ECOG): 1 - Symptomatic but completely ambulatory  PHYSICAL EXAM:  Physical Exam Constitutional:      General: He is not in acute distress.    Appearance: Normal appearance. He is normal weight. He is not ill-appearing, toxic-appearing or diaphoretic.  HENT:     Head: Normocephalic and atraumatic.     Nose: Nose normal. No congestion or rhinorrhea.     Mouth/Throat:     Mouth: Mucous membranes are moist.     Pharynx: Oropharynx is clear. No oropharyngeal exudate or posterior oropharyngeal erythema.  Eyes:     General: No scleral icterus.       Right eye: No discharge.        Left eye: No discharge.     Extraocular Movements: Extraocular movements intact.     Conjunctiva/sclera: Conjunctivae normal.     Pupils: Pupils are equal, round, and reactive to light.  Neck:     Vascular: No carotid bruit.  Cardiovascular:     Rate and Rhythm: Normal rate and regular rhythm.     Pulses: Normal pulses.     Heart sounds: Normal heart sounds. No murmur heard. No friction rub. No gallop.   Pulmonary:     Effort: Pulmonary effort is normal. No respiratory distress.     Breath sounds: Normal breath sounds. No stridor. No wheezing, rhonchi or rales.  Chest:     Chest wall: No tenderness.  Abdominal:     General: Abdomen is flat. Bowel sounds are normal. There is no distension.     Palpations: Abdomen is soft. There is no mass.     Tenderness: There is no abdominal tenderness. There is no right CVA tenderness, left CVA tenderness, guarding or rebound.     Hernia: No hernia is present.  Musculoskeletal:        General: No swelling, tenderness, deformity or signs of injury. Normal range of motion.     Cervical back: Normal range of motion and neck supple. No rigidity or tenderness.     Right lower leg: 1+ Edema present.     Left lower leg: 1+ Edema present.  Lymphadenopathy:     Cervical: No cervical adenopathy.  Skin:    General: Skin is warm and dry.     Capillary Refill: Capillary  refill takes less than 2 seconds.     Coloration: Skin is not jaundiced or pale.     Findings: No bruising, erythema, lesion or rash.  Neurological:     General: No focal deficit present.     Mental Status: He is alert and oriented to person, place, and time. Mental status is at baseline.     Cranial Nerves: No cranial nerve deficit.     Sensory: No sensory deficit.     Motor: No weakness.     Coordination: Coordination normal.     Gait: Gait normal.     Deep Tendon Reflexes: Reflexes normal.  Psychiatric:        Mood and Affect: Mood normal.        Behavior: Behavior normal.        Thought Content: Thought content normal.        Judgment: Judgment normal.  LABS:   CBC Latest Ref Rng & Units 05/12/2020 11/13/2019 03/07/2018  WBC - 4.8 5.5 5.3  Hemoglobin 13.5 - 17.5 12.1(A) 11.7(A) 9.2(L)  Hematocrit 41 - 53 37(A) 36(A) 30.6(L)  Platelets 150 - 399 165 182 232   CMP Latest Ref Rng & Units 05/12/2020 11/13/2019 03/07/2018  Glucose 70 - 99 mg/dL - - 113(H)  BUN 4 - 21 22(A) 14 6(L)  Creatinine 0.6 - 1.3 1.1 1.3 0.78  Sodium 137 - 147 139 141 140  Potassium 3.4 - 5.3 4.1 4.0 3.4(L)  Chloride 99 - 108 104 106 108  CO2 13 - 22 29(A) 28(A) 24  Calcium 8.7 - 10.7 8.7 9.3 8.7(L)  Total Protein 6.3 - 8.52 g/dL 6(A) - -  Alkaline Phos 25 - 125 135(A) 116 -  AST 14 - 40 24 37 -  ALT 10 - 40 20 24 -     No results found for: CEA1 / No results found for: CEA1   STUDIES:   He underwent CT chest with contrast on 02/10/2020 showing: 1. Stable RIGHT infrahilar soft tissue at the site of previous treatment. 2. Decreased size of nodular focus at the LEFT lung base now measuring 13 x 10 mm as compared to 17 x 10 mm. Likely nodular scarring. Attention on follow-up. 3. Tortuous and aneurysmal appearance of the thoracoabdominal aorta with similar appearance. 4. Three-vessel coronary artery disease. 5. Emphysema and aortic atherosclerosis.  Allergies:  Allergies  Allergen Reactions   . Lantus [Insulin Glargine]     Stomach pain  . Quinolones Other (See Comments)    History of aortic aneurysm status post repair.  Use with caution    Current Medications: Current Outpatient Medications  Medication Sig Dispense Refill  . allopurinol (ZYLOPRIM) 300 MG tablet Take 300 mg by mouth daily.    [name] apixaban (ELIQUIS) 5 MG TABS tablet Take 5 mg by mouth 2 (two) times daily.    . carvedilol (COREG) 3.125 MG tablet TAKE 1 TABLET TWICE A DAY 180 tablet 1  . celecoxib (CELEBREX) 200 MG capsule TAKE 1 CAPSULE TWICE A DAY 180 capsule 3  . ENTRESTO 97-103 MG TAKE 1 TABLET TWICE A DAY 180 tablet 3  . fluticasone-salmeterol (ADVAIR HFA) 115-21 MCG/ACT inhaler Inhale 2 puffs into the lungs 2 (two) times daily.     [name] gabapentin (NEURONTIN) 100 MG capsule Take 100 mg by mouth 2 (two) times daily.    . metFORMIN (GLUCOPHAGE) 500 MG tablet Take 500 mg by mouth daily with breakfast. 2 tablets daily    . Multiple Vitamin (MULTI-VITAMINS) TABS Take 1 tablet by mouth daily.    [name] omeprazole (PRILOSEC) 20 MG capsule Take 20 mg by mouth daily.    [name] oxyCODONE (OXY IR/ROXICODONE) 5 MG immediate release tablet Take 1 tablet (5 mg total) by mouth every 6 (six) hours as needed for severe pain. 120 tablet 0  . PROAIR HFA 108 (90 Base) MCG/ACT inhaler Inhale 1 puff into the lungs every 6 (six) hours as needed.     . simvastatin (ZOCOR) 40 MG tablet Take 40 mg by mouth daily.    [name] tiotropium (SPIRIVA) 18 MCG inhalation capsule Place 18 mcg into inhaler and inhale daily.     [name] torsemide (DEMADEX) 20 MG tablet Take 1 tablet (20 mg total) by mouth daily. Take an extra tablet by mouth at noon on Monday, Wednesday, and Friday. 90 tablet 3   No current facility-administered medications for this visit.     ASSESSMENT &  PLAN:   Assessment:   1. Stage IIIA non-small cell lung cancer with partial response to concurrent chemoradiation.  He completed 1 year of durvalumab on March 10th and tolerated this without  significant difficulty.  CT imaging in April revealed continued improvement.  PET scan from July 2021 revealed a large area of treated tumor/radiation fibrosis involving the right lower lobe with no hypermetabolism to suggest residual or recurrent tumor.  No evidence of metastatic disease was observed.  The areas on his CT scan remain stable. He remains without evidence of disease recurrence.   Plan: We plan to repeat CT imaging in 6 months. He will continue to follow with PCP. We will see him back in clinic in 3 months for repeat evaluation.   He verbalizes understanding of and agreement to the plans discussed today. He knows to call the office should any new questions or concerns arise.    Melodye Ped, NP [facility] AT [facility] [address] [place] [address] Dept: [phone] Dept Fax: [phone]

## 2020-05-19 ENCOUNTER — Encounter: Payer: Self-pay | Admitting: Hematology and Oncology

## 2020-05-31 ENCOUNTER — Other Ambulatory Visit: Payer: Self-pay

## 2020-05-31 DIAGNOSIS — G8929 Other chronic pain: Secondary | ICD-10-CM

## 2020-05-31 MED ORDER — OXYCODONE HCL 5 MG PO TABS: 5.0000 mg | ORAL_TABLET | Freq: Four times a day (QID) | ORAL | 0 refills | Status: DC | PRN

## 2020-07-01 ENCOUNTER — Other Ambulatory Visit: Payer: Self-pay

## 2020-07-01 DIAGNOSIS — G8929 Other chronic pain: Secondary | ICD-10-CM

## 2020-07-01 MED ORDER — OXYCODONE HCL 5 MG PO TABS
5.0000 mg | ORAL_TABLET | Freq: Four times a day (QID) | ORAL | 0 refills | Status: DC | PRN
Start: 2020-07-01 — End: 2020-08-01

## 2020-07-12 DIAGNOSIS — H532 Diplopia: Secondary | ICD-10-CM | POA: Diagnosis not present

## 2020-07-12 DIAGNOSIS — Z961 Presence of intraocular lens: Secondary | ICD-10-CM | POA: Diagnosis not present

## 2020-07-12 DIAGNOSIS — E119 Type 2 diabetes mellitus without complications: Secondary | ICD-10-CM | POA: Diagnosis not present

## 2020-07-13 DIAGNOSIS — G620 Drug-induced polyneuropathy: Secondary | ICD-10-CM | POA: Diagnosis not present

## 2020-07-13 DIAGNOSIS — J9611 Chronic respiratory failure with hypoxia: Secondary | ICD-10-CM | POA: Diagnosis not present

## 2020-07-13 DIAGNOSIS — I5040 Unspecified combined systolic (congestive) and diastolic (congestive) heart failure: Secondary | ICD-10-CM | POA: Diagnosis not present

## 2020-07-13 DIAGNOSIS — I714 Abdominal aortic aneurysm, without rupture: Secondary | ICD-10-CM | POA: Diagnosis not present

## 2020-07-13 DIAGNOSIS — Z86711 Personal history of pulmonary embolism: Secondary | ICD-10-CM | POA: Diagnosis not present

## 2020-07-13 DIAGNOSIS — Z85118 Personal history of other malignant neoplasm of bronchus and lung: Secondary | ICD-10-CM | POA: Diagnosis not present

## 2020-07-13 DIAGNOSIS — E1169 Type 2 diabetes mellitus with other specified complication: Secondary | ICD-10-CM | POA: Diagnosis not present

## 2020-07-13 DIAGNOSIS — Z8546 Personal history of malignant neoplasm of prostate: Secondary | ICD-10-CM | POA: Diagnosis not present

## 2020-07-13 DIAGNOSIS — M109 Gout, unspecified: Secondary | ICD-10-CM | POA: Diagnosis not present

## 2020-07-13 DIAGNOSIS — I10 Essential (primary) hypertension: Secondary | ICD-10-CM | POA: Diagnosis not present

## 2020-07-13 DIAGNOSIS — J449 Chronic obstructive pulmonary disease, unspecified: Secondary | ICD-10-CM | POA: Diagnosis not present

## 2020-07-13 DIAGNOSIS — E785 Hyperlipidemia, unspecified: Secondary | ICD-10-CM | POA: Diagnosis not present

## 2020-07-20 DIAGNOSIS — Z20822 Contact with and (suspected) exposure to covid-19: Secondary | ICD-10-CM | POA: Diagnosis not present

## 2020-08-01 ENCOUNTER — Other Ambulatory Visit: Payer: Self-pay

## 2020-08-01 DIAGNOSIS — G8929 Other chronic pain: Secondary | ICD-10-CM

## 2020-08-01 MED ORDER — OXYCODONE HCL 5 MG PO TABS: 5.0000 mg | ORAL_TABLET | Freq: Four times a day (QID) | ORAL | 0 refills | Status: DC | PRN

## 2020-08-11 NOTE — Progress Notes (Signed)
[name]  [address] [place],  [place]  [address] [phone]  Clinic Day:  08/18/2020  Referring physician: Renaldo Reel, PA   This document serves as a record of services personally performed by Hosie Poisson, MD. It was created on their behalf by Curry,Lauren E, a trained medical scribe. The creation of this record is based on the scribe's personal observations and the provider's statements to them.  CHIEF COMPLAINT:  CC: History of non-small cell lung cancer   Current Treatment:  Surveillance   HISTORY OF PRESENT ILLNESS:  John Maddox is a 80 y.o. male with stage IIIA (T3 N1 M0) non-small cell lung cancer diagnosed in September 2019.  He had presented with severe bradycardia and left chest discomfort and was found to have a 6.2 cm mass in the right upper lobe with 1 paratracheal node measuring 1-2 cm.  Bronchoscopy was done and and the 1st set of cytology and pathology results were negative.  Bronchial brushings and washings revealed rare atypical cells suspicious for malignancy in 1 specimen and malignant cells definitely consistent with non-small cell carcinoma of the lung, favoring adenocarcinoma, in a 2nd specimen.  We recommended concurrent chemoradiation with weekly carboplatin/paclitaxel.  He has many comorbidities including moderate emphysema, a distal descending thoracic aortic aneurysm measuring 4.4 cm, hypertension, congestive heart failure, diabetes mellitus, chronic kidney disease, multiple aortic aneurysms, peripheral artery disease, and history of prostate cancer 11 years ago.  His Cardiolite scan revealed mild global hypokinesis with an ejection fraction of 37%.  He also has pre-existing peripheral neuropathy.  MRI of the head did not reveal any evidence of intracranial metastasis.  He received 7 cycles of chemotherapy, which was completed in December 2019.  Unfortunately, the patient became quite ill and septic later in  December 2019.  He was transferred from [facility] to [facility].  He was found to have septic arthritis of his knee and was placed on IV antibiotics at home with daptomycin and ceftriaxone as well as rifampin.  The hemoglobin was 8.0 when he was discharged and continued to drop to 7.3 and then 7.1.  We were eventually able to transfuse him as an inpatient.   When the patient went to see Dr. Linus Salmons of Infectious Disease in February 2020, he collapsed prior to getting his labs drawn and had cardiorespiratory arrest requiring resuscitation.  He was transported to the emergency room and admitted.  He was found to have acute pulmonary embolism of small to moderate size, but no significant right heart strain.  He had deep venous thrombosis of the right femoral vein, right popliteal vein and right posterior tibial vein.  He was placed on heparin IV and transitioned to apixaban 5 mg twice daily, which he continues.  He was also found to have aspiration pneumonia during that hospitalization. A CT chest revealed the spiculated mass in the posterior right upper lobe was slightly smaller measuring 3.8 cm in maximum diameter, decreased from 4.0 cm.  There was stable right hilar adenopathy measuring 1.4 cm.  There was a small right pleural effusion.  He started maintenance durvalumab in March 2020 and tolerated it well.  The patient had a repeat echocardiogram, which showed improvement in his ejection fraction.  CT chest in early June revealed decrease in the size of the right upper lobe mass to 2.4 cm, with decrease in the pleural effusion.  There was a stable thoracic aortic aneurysm measuring 4.7 cm.  When he was  seen in June, he reported intermittent dizziness, but MRI brain did not reveal intracranial metastasis or other acute abnormality.  Repeat CT chest in September 2020 revealed improvement in the size of the right upper lobe pulmonary nodule from 2.4 cm to 1.9 cm, with a possible right lower  lobe pneumonia.  CT chest in January 2021 revealed slight interval increase in dense, post treatment fibrotic consolidation and volume loss of the perihilar right upper lobe, which is consistent with maturation of radiation fibrosis.  The previously seen clustered nodularity of the right lower lobe was essentially resolved, consistent with resolved atypical infection or inflammation.  Atherosclerotic, tortuous and aneurysmal thoracic aorta, the distal arch measuring approximately 5.0 cm and the tortuous descending thoracic aorta focally aneurysmal and measuring approximately 5.1 cm in greatest caliber orthogonal to flow was re-demonstrated.  I recommended FoundationOne testing of the peripheral blood, and these results show no reportable alterations with companion diagnostic claims.  Other biomarkers with potential clinical significance, included DNMT3A and TET2.  He completed a total of 1 year of durvalumab therapy on March 10th.  CT chest in April revealed post treatment changes about the right hilum measuring 6.9 x 2.5 cm, previously 7.4 x 3.4 cm.  There were nodular changes in the left lung base developing along the margin of the aorta measuring 1.7 x 1.1 cm, more conspicuous than on the previous exam.  Stable appearance of the thoracic aorta with tortuosity and dilation.  The upper abdominal aortic aneurysm with mural thrombus is similar in appearance measuring 4.6 cm.  PET imaging from July 2021 revealed a large are of treated tumor/radiation fibrosis involving the right lower lobe.  There was no hypermetabolism to suggest residual or recurrent tumor, no findings for mediastinal/hilar adenopathy or pulmonary metastatic disease.  There was no evidence of abdominal/pelvic metastatic disease or osseous metastatic disease.  Stable, severe vascular disease was seen.  The upper abdominal aorta measures 4.4 cm.  CT from January revealed stable right infrahilar soft tissue at the site of previous treatment.   Decreased size of nodular focus at the left lung base now measuring 13 x 10 mm as compared to 17 x 10 mm. Likely nodular scarring. Tortuous and aneurysmal appearance of the thoracoabdominal aorta with similar appearance.  INTERVAL HISTORY:  John Maddox is here for routine follow up and states that he continues to do fairly well other than problems of his knees.  He uses a walker to ambulate.  He does note intermittent lower extremity edema which is well controlled with furosemide.  He states that he has lost 10-15 pounds of water weight.  Hemoglobin is stable to mildly improved at 12.4, and white count and platelets are normal.  His  appetite is good, and he has lost 5 pounds since his last visit.  He denies fever, chills or other signs of infection.  He denies nausea, vomiting, bowel issues, or abdominal pain.  He denies sore throat, cough, dyspnea, or chest pain.  REVIEW OF SYSTEMS:  Review of Systems  Constitutional: Negative.  Negative for appetite change, chills, fatigue, fever and unexpected weight change.  HENT:  Negative.    Eyes: Negative.   Respiratory: Negative.  Negative for chest tightness, cough, hemoptysis, shortness of breath and wheezing.   Cardiovascular:  Positive for leg swelling (intermittent). Negative for chest pain and palpitations.  Gastrointestinal: Negative.  Negative for abdominal distention, abdominal pain, blood in stool, constipation, diarrhea, nausea and vomiting.  Endocrine: Negative.   Genitourinary: Negative.  Negative for difficulty  urinating, dysuria, frequency and hematuria.   Musculoskeletal:  Positive for arthralgias (bilateral knees) and gait problem (uses a walker to ambulate). Negative for back pain, flank pain and myalgias.  Skin: Negative.   Neurological:  Positive for gait problem (uses a walker to ambulate). Negative for dizziness, extremity weakness, headaches, light-headedness, numbness, seizures and speech difficulty.  Hematological: Negative.    Psychiatric/Behavioral: Negative.  Negative for depression and sleep disturbance. The patient is not nervous/anxious.   All other systems reviewed and are negative.   VITALS:  Blood pressure 137/72, pulse 88, temperature 98.4 F (36.9 C), temperature source Oral, resp. rate 18, height 5\' 10"  (1.778 m), weight 242 lb (109.8 kg), SpO2 95 %.  Wt Readings from Last 3 Encounters:  08/18/20 242 lb (109.8 kg)  05/12/20 247 lb 12.8 oz (112.4 kg)  03/31/20 250 lb 9.6 oz (113.7 kg)    Body mass index is 34.72 kg/m.  Performance status (ECOG): 1 - Symptomatic but completely ambulatory  PHYSICAL EXAM:  Physical Exam Constitutional:      General: He is not in acute distress.    Appearance: Normal appearance. He is normal weight.  HENT:     Head: Normocephalic and atraumatic.  Eyes:     General: No scleral icterus.    Extraocular Movements: Extraocular movements intact.     Conjunctiva/sclera: Conjunctivae normal.     Pupils: Pupils are equal, round, and reactive to light.  Cardiovascular:     Rate and Rhythm: Normal rate and regular rhythm.     Pulses: Normal pulses.     Heart sounds: Normal heart sounds. No murmur heard.   No friction rub. No gallop.  Pulmonary:     Effort: Pulmonary effort is normal. No respiratory distress.     Breath sounds: Normal breath sounds.  Abdominal:     General: Bowel sounds are normal. There is no distension.     Palpations: Abdomen is soft. There is no hepatomegaly, splenomegaly or mass.     Tenderness: There is no abdominal tenderness.  Musculoskeletal:        General: Normal range of motion.     Cervical back: Normal range of motion and neck supple.     Right lower leg: Edema (1-2+) present.     Left lower leg: Edema (1-2+) present.  Lymphadenopathy:     Cervical: No cervical adenopathy.  Skin:    General: Skin is warm and dry.  Neurological:     General: No focal deficit present.     Mental Status: He is alert and oriented to person, place,  and time. Mental status is at baseline.  Psychiatric:        Mood and Affect: Mood normal.        Behavior: Behavior normal.        Thought Content: Thought content normal.        Judgment: Judgment normal.    LABS:   CBC Latest Ref Rng & Units 08/18/2020 05/12/2020 11/13/2019  WBC - 5.5 4.8 5.5  Hemoglobin 13.5 - 17.5 12.4(A) 12.1(A) 11.7(A)  Hematocrit 41 - 53 38(A) 37(A) 36(A)  Platelets 150 - 399 169 165 182   CMP Latest Ref Rng & Units 05/12/2020 11/13/2019 03/07/2018  Glucose 70 - 99 mg/dL - - 113(H)  BUN 4 - 21 22(A) 14 6(L)  Creatinine 0.6 - 1.3 1.1 1.3 0.78  Sodium 137 - 147 139 141 140  Potassium 3.4 - 5.3 4.1 4.0 3.4(L)  Chloride 99 - 108 104 106 108  CO2 13 - 22 29(A) 28(A) 24  Calcium 8.7 - 10.7 8.7 9.3 8.7(L)  Total Protein 6.3 - 8.52 g/dL 6(A) - -  Alkaline Phos 25 - 125 135(A) 116 -  AST 14 - 40 24 37 -  ALT 10 - 40 20 24 -     No results found for: CEA1 / No results found for: CEA1   STUDIES:   Allergies:  Allergies  Allergen Reactions   Lantus [Insulin Glargine]     Stomach pain   Quinolones Other (See Comments)    History of aortic aneurysm status post repair.  Use with caution    Current Medications: Current Outpatient Medications  Medication Sig Dispense Refill   allopurinol (ZYLOPRIM) 300 MG tablet Take 300 mg by mouth daily.     apixaban (ELIQUIS) 5 MG TABS tablet Take 5 mg by mouth 2 (two) times daily.     carvedilol (COREG) 3.125 MG tablet TAKE 1 TABLET TWICE A DAY 180 tablet 1   celecoxib (CELEBREX) 200 MG capsule TAKE 1 CAPSULE TWICE A DAY 180 capsule 3   ENTRESTO 97-103 MG TAKE 1 TABLET TWICE A DAY 180 tablet 3   fluticasone-salmeterol (ADVAIR HFA) 115-21 MCG/ACT inhaler Inhale 2 puffs into the lungs 2 (two) times daily.      gabapentin (NEURONTIN) 100 MG capsule Take 100 mg by mouth 2 (two) times daily.     metFORMIN (GLUCOPHAGE) 500 MG tablet Take 500 mg by mouth daily with breakfast. 2 tablets daily     Multiple Vitamin  (MULTI-VITAMINS) TABS Take 1 tablet by mouth daily.     omeprazole (PRILOSEC) 20 MG capsule Take 20 mg by mouth daily.     oxyCODONE (OXY IR/ROXICODONE) 5 MG immediate release tablet Take 1 tablet (5 mg total) by mouth every 6 (six) hours as needed for severe pain. 120 tablet 0   PROAIR HFA 108 (90 Base) MCG/ACT inhaler Inhale 1 puff into the lungs every 6 (six) hours as needed.      simvastatin (ZOCOR) 40 MG tablet Take 40 mg by mouth daily.     tiotropium (SPIRIVA) 18 MCG inhalation capsule Place 18 mcg into inhaler and inhale daily.      torsemide (DEMADEX) 20 MG tablet Take 1 tablet (20 mg total) by mouth daily. Take an extra tablet by mouth at noon on Monday, Wednesday, and Friday. 90 tablet 3   No current facility-administered medications for this visit.     ASSESSMENT & PLAN:   Assessment:   1. Stage IIIA non-small cell lung cancer with partial response to concurrent chemoradiation.  He completed 1 year of durvalumab in March 2021 and tolerated this without significant difficulty.  CT imaging in April revealed continued improvement.  PET scan from July 2021 revealed a large area of treated tumor/radiation fibrosis involving the right lower lobe with no hypermetabolism to suggest residual or recurrent tumor.  No evidence of metastatic disease was observed.  CT scan from January revealed stable right infrahilar soft tissue, and decreased size of nodular focus at the left lung base now measuring 13 x 10 mm.  He remains without evidence of disease recurrence.  Plan: His port was flushed today, and we will continue this every 3 months.  We will schedule him for CT chest, abdomen and pelvis now since it has been over 6 months, and I will call him with the results.  We will see him back in clinic in 3 months with CBC, CMP and CEA for repeat evaluation.  After that visit, we will go to 4 months.  He verbalizes understanding of and agreement to the plans discussed today. He knows to call the office  should any new questions or concerns arise.    Derwood Kaplan, MD [facility] AT [facility] [address] [place] [address] Dept: [phone] Dept Fax: [phone]   I, Rita Ohara, am acting as scribe for Derwood Kaplan, MD  I have reviewed this report as typed by the medical scribe, and it is complete and accurate.

## 2020-08-18 ENCOUNTER — Other Ambulatory Visit: Payer: Self-pay | Admitting: Oncology

## 2020-08-18 ENCOUNTER — Inpatient Hospital Stay: Payer: Medicare Other

## 2020-08-18 ENCOUNTER — Encounter: Payer: Self-pay | Admitting: Oncology

## 2020-08-18 ENCOUNTER — Telehealth: Payer: Self-pay | Admitting: Oncology

## 2020-08-18 ENCOUNTER — Other Ambulatory Visit: Payer: Self-pay | Admitting: Hematology and Oncology

## 2020-08-18 ENCOUNTER — Inpatient Hospital Stay: Payer: Medicare Other | Attending: Oncology | Admitting: Oncology

## 2020-08-18 VITALS — BP 137/72 | HR 88 | Temp 98.4°F | Resp 18 | Ht 70.0 in | Wt 242.0 lb

## 2020-08-18 DIAGNOSIS — Z79899 Other long term (current) drug therapy: Secondary | ICD-10-CM | POA: Diagnosis not present

## 2020-08-18 DIAGNOSIS — Z85118 Personal history of other malignant neoplasm of bronchus and lung: Secondary | ICD-10-CM | POA: Insufficient documentation

## 2020-08-18 DIAGNOSIS — Z452 Encounter for adjustment and management of vascular access device: Secondary | ICD-10-CM | POA: Insufficient documentation

## 2020-08-18 DIAGNOSIS — Z8546 Personal history of malignant neoplasm of prostate: Secondary | ICD-10-CM | POA: Diagnosis not present

## 2020-08-18 DIAGNOSIS — Z7984 Long term (current) use of oral hypoglycemic drugs: Secondary | ICD-10-CM | POA: Insufficient documentation

## 2020-08-18 DIAGNOSIS — C3491 Malignant neoplasm of unspecified part of right bronchus or lung: Secondary | ICD-10-CM

## 2020-08-18 DIAGNOSIS — Z923 Personal history of irradiation: Secondary | ICD-10-CM | POA: Insufficient documentation

## 2020-08-18 DIAGNOSIS — I714 Abdominal aortic aneurysm, without rupture, unspecified: Secondary | ICD-10-CM

## 2020-08-18 DIAGNOSIS — Z7901 Long term (current) use of anticoagulants: Secondary | ICD-10-CM | POA: Insufficient documentation

## 2020-08-18 DIAGNOSIS — Z9221 Personal history of antineoplastic chemotherapy: Secondary | ICD-10-CM | POA: Diagnosis not present

## 2020-08-18 DIAGNOSIS — Z86711 Personal history of pulmonary embolism: Secondary | ICD-10-CM | POA: Diagnosis not present

## 2020-08-18 DIAGNOSIS — C3490 Malignant neoplasm of unspecified part of unspecified bronchus or lung: Secondary | ICD-10-CM | POA: Diagnosis not present

## 2020-08-18 LAB — HEPATIC FUNCTION PANEL
ALT: 18 (ref 10–40)
AST: 25 (ref 14–40)
Alkaline Phosphatase: 133 — AB (ref 25–125)
Bilirubin, Total: 0.7

## 2020-08-18 LAB — BASIC METABOLIC PANEL
BUN: 18 (ref 4–21)
CO2: 26 — AB (ref 13–22)
Chloride: 105 (ref 99–108)
Creatinine: 1.4 — AB (ref 0.6–1.3)
Glucose: 93
Potassium: 4.2 (ref 3.4–5.3)
Sodium: 142 (ref 137–147)

## 2020-08-18 LAB — CBC: RBC: 4.24 (ref 3.87–5.11)

## 2020-08-18 LAB — CBC AND DIFFERENTIAL
HCT: 38 — AB (ref 41–53)
Hemoglobin: 12.4 — AB (ref 13.5–17.5)
Neutrophils Absolute: 3.47
Platelets: 169 (ref 150–399)
WBC: 5.5

## 2020-08-18 LAB — COMPREHENSIVE METABOLIC PANEL
Albumin: 4.1 (ref 3.5–5.0)
Calcium: 9.3 (ref 8.7–10.7)

## 2020-08-18 MED ORDER — SODIUM CHLORIDE 0.9% FLUSH
10.0000 mL | INTRAVENOUS | Status: DC | PRN
  Administered 2020-08-18: 10 mL
  Filled 2020-08-18: qty 10

## 2020-08-18 MED ORDER — HEPARIN SOD (PORK) LOCK FLUSH 100 UNIT/ML IV SOLN
500.0000 [IU] | Freq: Once | INTRAVENOUS | Status: AC | PRN
  Administered 2020-08-18: 500 [IU]
  Filled 2020-08-18: qty 5

## 2020-08-18 NOTE — Telephone Encounter (Signed)
Per 8/4 los next appt scheduled and confirmed by patient

## 2020-08-19 LAB — CEA: CEA: 2 ng/mL (ref 0.0–4.7)

## 2020-08-21 ENCOUNTER — Encounter: Payer: Self-pay | Admitting: Hematology and Oncology

## 2020-08-22 ENCOUNTER — Other Ambulatory Visit: Payer: Self-pay | Admitting: Cardiology

## 2020-08-31 ENCOUNTER — Other Ambulatory Visit: Payer: Self-pay

## 2020-08-31 DIAGNOSIS — M549 Dorsalgia, unspecified: Secondary | ICD-10-CM

## 2020-08-31 DIAGNOSIS — G8929 Other chronic pain: Secondary | ICD-10-CM

## 2020-08-31 MED ORDER — OXYCODONE HCL 5 MG PO TABS: 5.0000 mg | ORAL_TABLET | Freq: Four times a day (QID) | ORAL | 0 refills | Status: DC | PRN

## 2020-09-01 DIAGNOSIS — Z1331 Encounter for screening for depression: Secondary | ICD-10-CM | POA: Diagnosis not present

## 2020-09-01 DIAGNOSIS — E785 Hyperlipidemia, unspecified: Secondary | ICD-10-CM | POA: Diagnosis not present

## 2020-09-01 DIAGNOSIS — Z0000 Encounter for general adult medical examination without abnormal findings: Secondary | ICD-10-CM | POA: Diagnosis not present

## 2020-09-01 DIAGNOSIS — Z9181 History of falling: Secondary | ICD-10-CM | POA: Diagnosis not present

## 2020-09-25 NOTE — Progress Notes (Signed)
Cardiology Office Note:    Date:  09/26/2020   ID:  John Maddox. John Maddox, John Maddox March 04, 1940, MRN [id number]  PCP:  John Reel, PA  Cardiologist:  John More, MD    Referring MD: John Maddox, [place]    ASSESSMENT:    1. Chronic combined systolic and diastolic heart failure ([place])   2. Hypertensive heart disease with heart failure ([place])   3. Chronic anticoagulation    PLAN:    In order of problems listed above:  He continues to do well with his cardiomyopathy and heart failure and has recovered normal ejection fraction on guideline directed therapy.  He has no fluid overload continue current treatment including his loop diuretic torsemide beta-blocker high-dose Entresto.  Recent labs are reassuring with normal potassium and stable renal function. BP at target continue guideline directed therapy Continue long-term anticoagulation with his previous pulmonary embolism and PEA arrest Stable CKD   Next appointment: 6 months   Medication Adjustments/Labs and Tests Ordered: Current medicines are reviewed at length with the patient today.  Concerns regarding medicines are outlined above.  No orders of the defined types were placed in this encounter.  No orders of the defined types were placed in this encounter.   Chief Complaint  Patient presents with   Follow-up   Congestive Heart Failure   Cardiomyopathy     History of Present Illness:    John Maddox is a 80 y.o. male with a hx of heart failure previously mildly reduced ejection fraction February 2000 2145 to 50%, venous thromboembolism on long-term anticoagulation with non-small cell lung cancer hypertensive heart disease with heart failure hyperlipidemia and a PEA arrest February 2020 due to pulmonary embolism.  He follows with [facility] pulmonary for severe COPD and [facility] at [facility] for his lung cancer.  Other problems include distal thoracic aortic aneurysm 5 cm and CKD stage  II.  He was last seen 03/31/2020 with volume overload and decompensated heart failure with increase in diuretic dose.  Compliance with diet, lifestyle and medications: Yes  He was somewhat improved he is now Maddox active and use a walker and not a wheelchair.  Outdoors he finds himself short of breath but not indoors no edema orthopnea he is not short of breath with ADLs.  He has had no chest pain edema syncope and tolerates his anticoagulant without bleeding.  Following the visit an echocardiogram performed 0 04/21/2020.  Left ventricle is normal in size EF 55 to 60% and normal diastolic filling pressure.  The right ventricle was normal in size function and normal pulmonary artery systolic pressure he had mild aortic regurgitation mild enlargement of the ascending aorta 37 mm.  1. Left ventricular ejection fraction, by estimation, is 55 to 60%. The  left ventricle has normal function. The left ventricle has no regional  wall motion abnormalities. Left ventricular diastolic parameters are  consistent with Grade I diastolic  dysfunction (impaired relaxation).   2. Right ventricular systolic function is normal. The right ventricular  size is normal. There is normal pulmonary artery systolic pressure.   3. The mitral valve is normal in structure. No evidence of mitral valve  regurgitation. No evidence of mitral stenosis.   4. The aortic valve is normal in structure. Aortic valve regurgitation is  mild. Mild aortic valve sclerosis is present, with no evidence of aortic  valve stenosis.   5. There is mild dilatation of the ascending aorta, measuring 37 mm.   6.  The inferior vena cava is normal in size with greater than 50%  respiratory variability, suggesting right atrial pressure of 3 mmHg.   Past Medical History:  Diagnosis Date   AAA (abdominal aortic aneurysm) without rupture ([place]) 08/03/2015   AKI (acute kidney injury) ([place]) 01/08/2018   Last Assessment & Plan:  Improved Nephrology consulted  Likely 2/2 ATN and CIN in the setting of contrast at the outside hospital on 12/21, NSAIDs at the outside hospital, intra-operative hypotension, and entresto use. Avoid nephrotoxic medications(entresto/diuretic) Renally dose medications Strict I&O Monitor creatinine(1.68 today)     Anemia 01/17/2018   Last Assessment & Plan:  Due to chemotherapy and anemia of chronic disease Iron panel shows anemia of chronic disease Follow up with PCP and oncologist   Arrhythmia    Asthma 09/23/2017   Atherosclerosis of native artery of both lower extremities ([place]) 08/03/2015   Bilateral carotid artery stenosis 08/03/2015   Cardiomyopathy ([place]) 11/18/2017   Chronic anticoagulation 06/05/2018   Chronic combined systolic and diastolic heart failure ([place]) 10/03/2017   Congestive heart failure ([place]) 09/23/2017   COPD (chronic obstructive pulmonary disease) ([place]) 09/23/2017   Diabetes mellitus ([place]) 09/23/2017   Diarrhea 01/14/2018   Last Assessment & Plan:  C diff - negative  GIP - negative Resolved   Diastolic dysfunction 02/21/3783   Duodenal nodule 11/10/2014   Esophageal reflux 09/23/2017   Essential hypertension 11/10/2014   Last Assessment & Plan:  Controlled off of medication Follow up with PCP as an outpatient   Fatigue 09/23/2017   Gout 09/23/2017   Hematuria 01/14/2018   Last Assessment & Plan:  Need UA as outpatient.   History of prostate cancer 09/23/2017   Hyperlipidemia 09/23/2017   Hypertension 09/23/2017   Hypertensive heart disease 09/23/2017   Hypertensive heart disease with heart failure ([place]) 06/06/2018   Hypokalemia 01/14/2018   Last Assessment & Plan:  Replace and follow up with PCP   Hypomagnesemia 01/14/2018   Last Assessment & Plan:  Replace and monitor and follow up with PCP   Hypophosphatemia 01/15/2018   Last Assessment & Plan:  Resolved   Infected prosthetic knee joint, initial encounter ([place]) 03/06/2018   Insulin dependent type 2 diabetes mellitus ([place]) 11/10/2014   Last Assessment & Plan:  Glucose  109-140 Continue ISS Monitor glucose   Left ventricular dysfunction 10/03/2017   Lung cancer ([place])    Lung mass 10/04/2017   Non-small cell lung cancer ([place]) 10/03/2017   Pulmonary emboli ([place]) 03/06/2018   Pulmonary embolism ([facility])    PVC's (premature ventricular contractions) 09/23/2017   PVD (peripheral vascular disease) ([place]) 11/10/2014   Septic arthritis of knee, left ([place]) 01/04/2018   Added automatically from request for surgery [id number]  Last Assessment & Plan:  Prosthetic knee infection S/p wash out Culture negative  Continue Dapto + Ceftriaxone + Rifampin for 4 weeks(currently day 7) ID was notified today prior to discharge He will need CBC, CMP, ESR, CRP weekly faxed to [phone]([facility] with ID) ID outpatient appointment is on 02/17/2017  Antibiotics administered by port by ho   Thrombocytopenia ([place]) 01/15/2018   Last Assessment & Plan:  Decreased to 78.  Due to chemotherapy Peripheral smear negative for schistocytes LDH 211(within normal limits), haptoglobin 329(elevated), Retic 1.7 B12=714, Folate=20, copper pending    Past Surgical History:  Procedure Laterality Date   ABDOMINAL AORTIC ANEURYSM REPAIR     AORTA - ILIAC ARTERY BYPASS GRAFT     BACK SURGERY     CATARACT EXTRACTION, BILATERAL  CHOLECYSTECTOMY     HEMICOLECTOMY     HERNIA REPAIR     KNEE SURGERY Left    replaced all the hardwear   PROSTATE SURGERY     REPLACEMENT TOTAL KNEE     STENT PLACEMENT VASCULAR ([place] HX)     TONSILLECTOMY      Current Medications: No outpatient medications have been marked as taking for the 09/26/20 encounter (Office Visit) with Richardo Priest, MD.     Allergies:   Lantus [insulin glargine] and Quinolones   Social History   Socioeconomic History   Marital status: Married    Spouse name: Not on file   Number of children: Not on file   Years of education: Not on file   Highest education level: Not on file  Occupational History   Not on file  Tobacco Use   Smoking status:  Former    Packs/day: 2.50    Years: 40.00    Pack years: 100.00    Types: Cigarettes    Quit date: 2014    Years since quitting: 8.7   Smokeless tobacco: Never  Vaping Use   Vaping Use: Never used  Substance and Sexual Activity   Alcohol use: Not Currently   Drug use: Not Currently   Sexual activity: Not on file  Other Topics Concern   Not on file  Social History Narrative   Not on file   Social Determinants of Health   Financial Resource Strain: Not on file  Food Insecurity: Not on file  Transportation Needs: Not on file  Physical Activity: Not on file  Stress: Not on file  Social Connections: Not on file     Family History: The patient's family history includes Alcohol abuse in his father; Arthritis in his brother, father, and mother; Heart attack in his father; Heart disease in his father; Hyperlipidemia in his father; Hypertension in his father and mother; Lung cancer in his brother and mother; Prostate cancer in his father. ROS:   Please see the history of present illness.    All other systems reviewed and are negative.  EKGs/Labs/Other Studies Reviewed:    The following studies were reviewed today:    Recent Labs: 08/18/2020: ALT 18; BUN 18; Creatinine 1.4; Hemoglobin 12.4; Platelets 169; Potassium 4.2; Sodium 142  Recent Lipid Panel No results found for: CHOL, TRIG, HDL, CHOLHDL, VLDL, LDLCALC, LDLDIRECT  Physical Exam:    VS:  BP 116/70 (BP Location: Right Arm, Patient Position: Sitting)   Pulse 100   Ht '5\' 10"'  (1.778 m)   Wt 237 lb 9.6 oz (107.8 kg)   SpO2 97%   BMI 34.09 kg/m     Wt Readings from Last 3 Encounters:  09/26/20 237 lb 9.6 oz (107.8 kg)  08/18/20 242 lb (109.8 kg)  05/12/20 247 lb 12.8 oz (112.4 kg)     GEN:  Well nourished, well developed in no acute distress HEENT: Normal NECK: No JVD; No carotid bruits LYMPHATICS: No lymphadenopathy CARDIAC: RRR, no murmurs, rubs, gallops RESPIRATORY:  Clear to auscultation without rales,  wheezing or rhonchi  ABDOMEN: Soft, non-tender, non-distended MUSCULOSKELETAL:  No edema; No deformity  SKIN: Warm and dry NEUROLOGIC:  Maddox and oriented x 3 PSYCHIATRIC:  Normal affect    Signed, John More, MD  09/26/2020 2:49 PM    [place]

## 2020-09-26 ENCOUNTER — Other Ambulatory Visit: Payer: Self-pay

## 2020-09-26 ENCOUNTER — Encounter: Payer: Self-pay | Admitting: Cardiology

## 2020-09-26 ENCOUNTER — Ambulatory Visit (INDEPENDENT_AMBULATORY_CARE_PROVIDER_SITE_OTHER): Payer: Medicare Other | Admitting: Cardiology

## 2020-09-26 VITALS — BP 116/70 | HR 100 | Ht 70.0 in | Wt 237.6 lb

## 2020-09-26 DIAGNOSIS — I110 Hypertensive heart disease with heart failure: Secondary | ICD-10-CM

## 2020-09-26 DIAGNOSIS — I5042 Chronic combined systolic (congestive) and diastolic (congestive) heart failure: Secondary | ICD-10-CM | POA: Diagnosis not present

## 2020-09-26 DIAGNOSIS — Z7901 Long term (current) use of anticoagulants: Secondary | ICD-10-CM

## 2020-09-26 MED ORDER — CELECOXIB 200 MG PO CAPS: 200.0000 mg | ORAL_CAPSULE | Freq: Two times a day (BID) | ORAL | 3 refills | Status: DC

## 2020-09-26 MED ORDER — ENTRESTO 97-103 MG PO TABS: 1.0000 | ORAL_TABLET | Freq: Two times a day (BID) | ORAL | 3 refills | Status: DC

## 2020-09-26 MED ORDER — TORSEMIDE 20 MG PO TABS: 20.0000 mg | ORAL_TABLET | Freq: Every day | ORAL | 3 refills | Status: DC

## 2020-09-26 MED ORDER — SIMVASTATIN 40 MG PO TABS: 40.0000 mg | ORAL_TABLET | Freq: Every day | ORAL | 3 refills | Status: AC

## 2020-09-26 MED ORDER — CARVEDILOL 3.125 MG PO TABS: 3.1250 mg | ORAL_TABLET | Freq: Two times a day (BID) | ORAL | 3 refills | Status: DC

## 2020-09-26 MED ORDER — APIXABAN 5 MG PO TABS: 5.0000 mg | ORAL_TABLET | Freq: Two times a day (BID) | ORAL | 3 refills | Status: AC

## 2020-09-26 NOTE — Patient Instructions (Signed)

## 2020-09-27 ENCOUNTER — Other Ambulatory Visit: Payer: Self-pay | Admitting: Cardiology

## 2020-09-28 NOTE — Telephone Encounter (Signed)
Torsemide 20 mg # 130 x 3 refills sent to  [place], [place]

## 2020-09-30 ENCOUNTER — Other Ambulatory Visit: Payer: Self-pay | Admitting: Hematology and Oncology

## 2020-09-30 ENCOUNTER — Telehealth: Payer: Self-pay | Admitting: Oncology

## 2020-09-30 DIAGNOSIS — G8929 Other chronic pain: Secondary | ICD-10-CM

## 2020-09-30 DIAGNOSIS — M25562 Pain in left knee: Secondary | ICD-10-CM

## 2020-09-30 MED ORDER — OXYCODONE HCL 5 MG PO TABS: 5.0000 mg | ORAL_TABLET | Freq: Four times a day (QID) | ORAL | 0 refills | Status: DC | PRN

## 2020-09-30 NOTE — Telephone Encounter (Signed)
Spoke w/patient's spouse concerning scheduled Labs, CT Scans for 9/21.  Gave Appt Time/Instructions

## 2020-10-05 ENCOUNTER — Encounter: Payer: Self-pay | Admitting: Oncology

## 2020-10-05 DIAGNOSIS — I700 Atherosclerosis of aorta: Secondary | ICD-10-CM | POA: Diagnosis not present

## 2020-10-05 DIAGNOSIS — K5730 Diverticulosis of large intestine without perforation or abscess without bleeding: Secondary | ICD-10-CM | POA: Diagnosis not present

## 2020-10-05 DIAGNOSIS — N200 Calculus of kidney: Secondary | ICD-10-CM | POA: Diagnosis not present

## 2020-10-05 DIAGNOSIS — Z8546 Personal history of malignant neoplasm of prostate: Secondary | ICD-10-CM | POA: Diagnosis not present

## 2020-10-05 DIAGNOSIS — I712 Thoracic aortic aneurysm, without rupture: Secondary | ICD-10-CM | POA: Diagnosis not present

## 2020-10-05 DIAGNOSIS — I714 Abdominal aortic aneurysm, without rupture: Secondary | ICD-10-CM | POA: Diagnosis not present

## 2020-10-05 DIAGNOSIS — I2510 Atherosclerotic heart disease of native coronary artery without angina pectoris: Secondary | ICD-10-CM | POA: Diagnosis not present

## 2020-10-05 DIAGNOSIS — J701 Chronic and other pulmonary manifestations due to radiation: Secondary | ICD-10-CM | POA: Diagnosis not present

## 2020-10-05 DIAGNOSIS — J439 Emphysema, unspecified: Secondary | ICD-10-CM | POA: Diagnosis not present

## 2020-10-07 ENCOUNTER — Telehealth: Payer: Self-pay

## 2020-10-07 NOTE — Telephone Encounter (Signed)
Patient notified and patient states he does not want the scan sent anywhere else

## 2020-10-07 NOTE — Telephone Encounter (Signed)
-----   Message from Derwood Kaplan, MD sent at 10/07/2020 10:51 AM EDT ----- Regarding: call pt Tell him CT looks good, just scar tissue in chest, no cancer seen.  He does still have the aneurysms. Does he want [place] to send report to his other docs?

## 2020-11-01 ENCOUNTER — Other Ambulatory Visit: Payer: Self-pay

## 2020-11-01 DIAGNOSIS — G8929 Other chronic pain: Secondary | ICD-10-CM

## 2020-11-01 DIAGNOSIS — M549 Dorsalgia, unspecified: Secondary | ICD-10-CM

## 2020-11-01 DIAGNOSIS — M25562 Pain in left knee: Secondary | ICD-10-CM

## 2020-11-01 MED ORDER — OXYCODONE HCL 5 MG PO TABS: 5.0000 mg | ORAL_TABLET | Freq: Four times a day (QID) | ORAL | 0 refills | Status: DC | PRN

## 2020-11-09 DIAGNOSIS — I700 Atherosclerosis of aorta: Secondary | ICD-10-CM | POA: Diagnosis not present

## 2020-11-09 DIAGNOSIS — E785 Hyperlipidemia, unspecified: Secondary | ICD-10-CM | POA: Diagnosis not present

## 2020-11-09 DIAGNOSIS — I10 Essential (primary) hypertension: Secondary | ICD-10-CM | POA: Diagnosis not present

## 2020-11-09 DIAGNOSIS — M109 Gout, unspecified: Secondary | ICD-10-CM | POA: Diagnosis not present

## 2020-11-09 DIAGNOSIS — I5040 Unspecified combined systolic (congestive) and diastolic (congestive) heart failure: Secondary | ICD-10-CM | POA: Diagnosis not present

## 2020-11-09 DIAGNOSIS — T451X5A Adverse effect of antineoplastic and immunosuppressive drugs, initial encounter: Secondary | ICD-10-CM | POA: Diagnosis not present

## 2020-11-09 DIAGNOSIS — Z139 Encounter for screening, unspecified: Secondary | ICD-10-CM | POA: Diagnosis not present

## 2020-11-09 DIAGNOSIS — J9611 Chronic respiratory failure with hypoxia: Secondary | ICD-10-CM | POA: Diagnosis not present

## 2020-11-09 DIAGNOSIS — Z86711 Personal history of pulmonary embolism: Secondary | ICD-10-CM | POA: Diagnosis not present

## 2020-11-09 DIAGNOSIS — J449 Chronic obstructive pulmonary disease, unspecified: Secondary | ICD-10-CM | POA: Diagnosis not present

## 2020-11-09 DIAGNOSIS — E1169 Type 2 diabetes mellitus with other specified complication: Secondary | ICD-10-CM | POA: Diagnosis not present

## 2020-11-09 DIAGNOSIS — G620 Drug-induced polyneuropathy: Secondary | ICD-10-CM | POA: Diagnosis not present

## 2020-11-09 DIAGNOSIS — Z23 Encounter for immunization: Secondary | ICD-10-CM | POA: Diagnosis not present

## 2020-11-09 LAB — HEPATIC FUNCTION PANEL
ALT: 15 (ref 10–40)
AST: 20 (ref 14–40)
Alkaline Phosphatase: 177 — AB (ref 25–125)
Bilirubin, Total: 0.6

## 2020-11-09 LAB — CBC AND DIFFERENTIAL
HCT: 38 — AB (ref 41–53)
Hemoglobin: 12.9 — AB (ref 13.5–17.5)
Neutrophils Absolute: 5.4
Platelets: 178 (ref 150–399)
WBC: 7.6

## 2020-11-09 LAB — CBC: RBC: 4.41 (ref 3.87–5.11)

## 2020-11-09 LAB — BASIC METABOLIC PANEL
BUN: 19 (ref 4–21)
CO2: 24 — AB (ref 13–22)
Chloride: 101 (ref 99–108)
Creatinine: 1.2 (ref 0.6–1.3)
Glucose: 87
Potassium: 4.3 (ref 3.4–5.3)
Sodium: 141 (ref 137–147)

## 2020-11-09 LAB — COMPREHENSIVE METABOLIC PANEL
Albumin: 4.3 (ref 3.5–5.0)
Calcium: 9.9 (ref 8.7–10.7)

## 2020-11-14 NOTE — Progress Notes (Signed)
[name]  [address] [place],  [place]  [address] [phone]  Clinic Day:  11/18/2020  Referring physician: Renaldo Reel, PA   This document serves as a record of services personally performed by Hosie Poisson, MD. It was created on their behalf by Curry,Lauren E, a trained medical scribe. The creation of this record is based on the scribe's personal observations and the provider's statements to them.  CHIEF COMPLAINT:  CC: History of non-small cell lung cancer   Current Treatment:  Surveillance   HISTORY OF PRESENT ILLNESS:  John Maddox is a 80 y.o. male with stage IIIA (T3 N1 M0) non-small cell lung cancer diagnosed in September 2019.  He had presented with severe bradycardia and left chest discomfort and was found to have a 6.2 cm mass in the right upper lobe with 1 paratracheal node measuring 1-2 cm.  Bronchoscopy was done and and the 1st set of cytology and pathology results were negative.  Bronchial brushings and washings revealed rare atypical cells suspicious for malignancy in 1 specimen and malignant cells definitely consistent with non-small cell carcinoma of the lung, favoring adenocarcinoma, in a 2nd specimen.  We recommended concurrent chemoradiation with weekly carboplatin/paclitaxel.  He has many comorbidities including moderate emphysema, a distal descending thoracic aortic aneurysm measuring 4.4 cm, hypertension, congestive heart failure, diabetes mellitus, chronic kidney disease, multiple aortic aneurysms, peripheral artery disease, and history of prostate cancer 11 years ago.  His Cardiolite scan revealed mild global hypokinesis with an ejection fraction of 37%.  He also has pre-existing peripheral neuropathy.  MRI of the head did not reveal any evidence of intracranial metastasis.  He received 7 cycles of chemotherapy, which was completed in December 2019.  Unfortunately, the patient became quite ill and septic later in  December 2019.  He was transferred from [facility].  He was found to have septic arthritis of his knee and was placed on IV antibiotics at home with daptomycin and ceftriaxone as well as rifampin.  The hemoglobin was 8.0 when he was discharged and continued to drop to 7.3 and then 7.1.  We were eventually able to transfuse him as an inpatient.   When the patient went to see Dr. Linus Salmons of Infectious Disease in February 2020, he collapsed prior to getting his labs drawn and had cardiorespiratory arrest requiring resuscitation.  He was transported to the emergency room and admitted.  He was found to have acute pulmonary embolism of small to moderate size, but no significant right heart strain.  He had deep venous thrombosis of the right femoral vein, right popliteal vein and right posterior tibial vein.  He was placed on heparin IV and transitioned to apixaban 5 mg twice daily, which he continues.  He was also found to have aspiration pneumonia during that hospitalization. A CT chest revealed the spiculated mass in the posterior right upper lobe was slightly smaller measuring 3.8 cm in maximum diameter, decreased from 4.0 cm.  There was stable right hilar adenopathy measuring 1.4 cm.  There was a small right pleural effusion.  He started maintenance durvalumab in March 2020 and tolerated it well.  The patient had a repeat echocardiogram, which showed improvement in his ejection fraction.  CT chest in early June revealed decrease in the size of the right upper lobe mass to 2.4 cm, with decrease in the pleural effusion.  There was a stable thoracic aortic aneurysm measuring 4.7 cm.  When he was  seen in June, he reported intermittent dizziness, but MRI brain did not reveal intracranial metastasis or other acute abnormality.  Repeat CT chest in September 2020 revealed improvement in the size of the right upper lobe pulmonary nodule from 2.4 cm to 1.9 cm, with a possible right lower  lobe pneumonia.  CT chest in January 2021 revealed slight interval increase in dense, post treatment fibrotic consolidation and volume loss of the perihilar right upper lobe, which is consistent with maturation of radiation fibrosis.  The previously seen clustered nodularity of the right lower lobe was essentially resolved, consistent with resolved atypical infection or inflammation.  Atherosclerotic, tortuous and aneurysmal thoracic aorta, the distal arch measuring approximately 5.0 cm and the tortuous descending thoracic aorta focally aneurysmal and measuring approximately 5.1 cm in greatest caliber orthogonal to flow was re-demonstrated.  I recommended [employer] testing of the peripheral blood, and these results show no reportable alterations with companion diagnostic claims.  Other biomarkers with potential clinical significance, included DNMT3A and TET2.  He completed a total of 1 year of durvalumab therapy on March 10th.  CT chest in April revealed post treatment changes about the right hilum measuring 6.9 x 2.5 cm, previously 7.4 x 3.4 cm.  There were nodular changes in the left lung base developing along the margin of the aorta measuring 1.7 x 1.1 cm, more conspicuous than on the previous exam.  Stable appearance of the thoracic aorta with tortuosity and dilation.  The upper abdominal aortic aneurysm with mural thrombus is similar in appearance measuring 4.6 cm.  PET imaging from July 2021 revealed a large are of treated tumor/radiation fibrosis involving the right lower lobe.  There was no hypermetabolism to suggest residual or recurrent tumor, no findings for mediastinal/hilar adenopathy or pulmonary metastatic disease.  There was no evidence of abdominal/pelvic metastatic disease or osseous metastatic disease.  Stable, severe vascular disease was seen.  The upper abdominal aorta measures 4.4 cm.  CT from January revealed stable right infrahilar soft tissue at the site of previous treatment.  Decreased  size of nodular focus at the left lung base now measuring 13 x 10 mm as compared to 17 x 10 mm. Likely nodular scarring. Tortuous and aneurysmal appearance of the thoracoabdominal aorta with similar appearance.  INTERVAL HISTORY:  John Maddox is here for routine follow up and states that he is doing fair. He has chronic arthralgias of the knees and hips. He remains in a wheelchair. Otherwise, he denies complaints. CT chest, abdomen and pelvis from September revealed unchanged post treatment/post radiation appearance of the right lung, with perihilar fibrosis and consolidation with no evidence of recurrent or metastatic disease. Redemonstrated aneurysmal aortic arch and descending thoracic aorta, maximum caliber of the distal arch approximately 4.6 x 4.2 cm and of the distal descending thoracic aorta approximately 5.2 x 5.2 cm. Labs from October were unremarkable except for a hemoglobin of 12.9. Today his hemoglobin is 12.3, and white count and platelets are normal. Chemistries are unremarkable except for a creatinine of 1.3, previously 1.4. His  appetite is good, and his weight is stable since his last visit.  He denies fever, chills or other signs of infection.  He denies nausea, vomiting, bowel issues, or abdominal pain.  He denies sore throat, cough, dyspnea, or chest pain. He is accompanied by his wife today.  REVIEW OF SYSTEMS:  Review of Systems  Constitutional: Negative.  Negative for appetite change, chills, fatigue, fever and unexpected weight change.  HENT:  Negative.    Eyes:  Negative.   Respiratory: Negative.  Negative for chest tightness, cough, hemoptysis, shortness of breath and wheezing.   Cardiovascular: Negative.  Negative for chest pain, leg swelling and palpitations.  Gastrointestinal: Negative.  Negative for abdominal distention, abdominal pain, blood in stool, constipation, diarrhea, nausea and vomiting.  Endocrine: Negative.   Genitourinary: Negative.  Negative for difficulty  urinating, dysuria, frequency and hematuria.   Musculoskeletal:  Positive for arthralgias. Negative for back pain, flank pain, gait problem and myalgias.  Skin: Negative.   Neurological: Negative.  Negative for dizziness, extremity weakness, gait problem, headaches, light-headedness, numbness, seizures and speech difficulty.  Hematological: Negative.   Psychiatric/Behavioral: Negative.  Negative for depression and sleep disturbance. The patient is not nervous/anxious.   All other systems reviewed and are negative.   VITALS:  Blood pressure 117/78, pulse 91, temperature 98.1 F (36.7 C), temperature source Oral, resp. rate 18, height 5\' 10"  (1.778 m), weight 237 lb 9.6 oz (107.8 kg), SpO2 95 %.  Wt Readings from Last 3 Encounters:  11/18/20 237 lb 9.6 oz (107.8 kg)  09/26/20 237 lb 9.6 oz (107.8 kg)  08/18/20 242 lb (109.8 kg)    Body mass index is 34.09 kg/m.  Performance status (ECOG): 1 - Symptomatic but completely ambulatory  PHYSICAL EXAM:  Physical Exam Constitutional:      General: He is not in acute distress.    Appearance: Normal appearance. He is normal weight.  HENT:     Head: Normocephalic and atraumatic.  Eyes:     General: No scleral icterus.    Extraocular Movements: Extraocular movements intact.     Conjunctiva/sclera: Conjunctivae normal.     Pupils: Pupils are equal, round, and reactive to light.  Cardiovascular:     Rate and Rhythm: Normal rate and regular rhythm.     Pulses: Normal pulses.     Heart sounds: Normal heart sounds. No murmur heard.   No friction rub. No gallop.  Pulmonary:     Effort: Pulmonary effort is normal. No respiratory distress.     Breath sounds: Normal breath sounds.  Abdominal:     General: Bowel sounds are normal. There is no distension.     Palpations: Abdomen is soft. There is no hepatomegaly, splenomegaly or mass.     Tenderness: There is no abdominal tenderness.  Musculoskeletal:        General: Normal range of motion.      Cervical back: Normal range of motion and neck supple.     Right lower leg: Edema (1-2+) present.     Left lower leg: Edema (1-2+) present.  Lymphadenopathy:     Cervical: No cervical adenopathy.  Skin:    General: Skin is warm and dry.  Neurological:     General: No focal deficit present.     Mental Status: He is alert and oriented to person, place, and time. Mental status is at baseline.  Psychiatric:        Mood and Affect: Mood normal.        Behavior: Behavior normal.        Thought Content: Thought content normal.        Judgment: Judgment normal.    LABS:   CBC Latest Ref Rng & Units 11/18/2020 08/18/2020 05/12/2020  WBC - 5.9 5.5 4.8  Hemoglobin 13.5 - 17.5 12.3(A) 12.4(A) 12.1(A)  Hematocrit 41 - 53 38(A) 38(A) 37(A)  Platelets 150 - 399 190 169 165   CMP Latest Ref Rng & Units 11/18/2020 08/18/2020 05/12/2020  Glucose  70 - 99 mg/dL - - -  BUN 4 - 21 15 18  22(A)  Creatinine 0.6 - 1.3 1.3 1.4(A) 1.1  Sodium 137 - 147 143 142 139  Potassium 3.4 - 5.3 3.8 4.2 4.1  Chloride 99 - 108 107 105 104  CO2 13 - 22 26(A) 26(A) 29(A)  Calcium 8.7 - 10.7 9.1 9.3 8.7  Total Protein 6.3 - 8.52 g/dL - - 6(A)  Alkaline Phos 25 - 125 149(A) 133(A) 135(A)  AST 14 - 40 26 25 24   ALT 10 - 40 18 18 20      Lab Results  Component Value Date   CEA1 2.0 08/18/2020   /  CEA  Date Value Ref Range Status  08/18/2020 2.0 0.0 - 4.7 ng/mL Final    Comment:    (NOTE)                             Nonsmokers          <3.9                             Smokers             <5.6 [employer] Electrochemiluminescence Immunoassay (ECLIA) Values obtained with different assay methods or kits cannot be used interchangeably.  Results cannot be interpreted as absolute evidence of the presence or absence of malignant disease. Performed At: [facility] [place], [place] [id number] Rush Farmer MD LG:[id number]      STUDIES:   EXAM: 10/05/2020 CT CHEST, ABDOMEN, AND  PELVIS WITH CONTRAST  TECHNIQUE: Multidetector CT imaging of the chest, abdomen and pelvis was performed following the standard protocol during bolus administration of intravenous contrast.  CONTRAST: 100 mL Isovue 370 iodinated contrast IV, additional oral enteric contrast  COMPARISON: CT chest, 02/10/2020, PET-CT, 08/11/2019  FINDINGS: CT CHEST FINDINGS Cardiovascular: Left chest port catheter. Redemonstrated aneurysmal aortic arch and descending thoracic aorta, maximum caliber of the distal arch approximately 4.6 x 4.2 cm and of the distal descending thoracic aorta approximately 5.2 x 5.2 cm (series 601, image 85). Normal heart size. Three-vessel coronary artery calcifications no pericardial effusion. Mediastinum/Nodes: No enlarged mediastinal, hilar, or axillary lymph nodes. Thyroid gland, trachea, and esophagus demonstrate no significant findings. Lungs/Pleura: Moderate centrilobular emphysema. Unchanged post treatment/post radiation appearance of the right lung, with perihilar fibrosis and consolidation. No pleural effusion or pneumothorax. Musculoskeletal: No chest wall mass or suspicious bone lesions identified. CT ABDOMEN PELVIS FINDINGS Hepatobiliary: No focal liver abnormality is seen. Status post cholecystectomy. No biliary dilatation. Pancreas: Unremarkable. No pancreatic ductal dilatation or surrounding inflammatory changes. Spleen: Normal in size without significant abnormality. Adrenals/Urinary Tract: Adrenal glands are unremarkable. Small nonobstructive calculus of the inferior pole of the right kidney. No left-sided calculi or hydronephrosis. Thickening of the relatively decompressed urinary bladder, likely secondary to chronic outlet obstruction. Stomach/Bowel: Stomach is within normal limits. Diverticula of the duodenum. Appendix appears normal. No evidence of bowel wall thickening, distention, or inflammatory changes. Descending and sigmoid  diverticulosis. Vascular/Lymphatic: Aortic atherosclerosis. Status post aortobiiliac stent endograft repair of the infrarenal abdominal aorta. Unchanged aneurysm of the proximal native abdominal aorta measuring up to 4.8 x 4.4 cm. No enlarged abdominal or pelvic lymph nodes. Reproductive: Marking clips in the prostate. Other: No abdominal wall hernia or abnormality. No abdominopelvic ascites. Musculoskeletal: No acute or significant osseous findings.  IMPRESSION: 1. Unchanged post treatment/post radiation appearance  of the right lung, with perihilar fibrosis and consolidation. 2. No evidence of recurrent or metastatic disease in the chest, abdomen, or pelvis. 3. Redemonstrated aneurysmal aortic arch and descending thoracic aorta, maximum caliber of the distal arch approximately 4.6 x 4.2 cm and of the distal descending thoracic aorta approximately 5.2 x 5.2 cm. Status post aortobiiliac stent endograft repair of the infrarenal abdominal aorta. Unchanged aneurysm of the proximal native abdominal aorta measuring up to 4.8 x 4.4 cm. Recommend semi-annual imaging followup by CTA or MRA and referral to cardiothoracic surgery if not already obtained. This recommendation follows 2010 ACCF/AHA/AATS/ACR/ASA/SCA/SCAI/SIR/STS/SVM Guidelines for the Diagnosis and Management of Patients With Thoracic Aortic Disease. Circulation. 2010; 121: H419-F790. Aortic aneurysm NOS (ICD10-I71.9) 4. Emphysema. 5. Coronary artery disease.  Allergies:  Allergies  Allergen Reactions   Lantus [Insulin Glargine]     Stomach pain   Quinolones Other (See Comments)    History of aortic aneurysm status post repair.  Use with caution    Current Medications: Current Outpatient Medications  Medication Sig Dispense Refill   allopurinol (ZYLOPRIM) 300 MG tablet Take 300 mg by mouth daily.     apixaban (ELIQUIS) 5 MG TABS tablet Take 1 tablet (5 mg total) by mouth 2 (two) times daily. 180 tablet 3   carvedilol  (COREG) 3.125 MG tablet Take 1 tablet (3.125 mg total) by mouth 2 (two) times daily. 180 tablet 3   celecoxib (CELEBREX) 200 MG capsule Take 1 capsule (200 mg total) by mouth 2 (two) times daily. 180 capsule 3   fluticasone-salmeterol (ADVAIR HFA) 115-21 MCG/ACT inhaler Inhale 2 puffs into the lungs 2 (two) times daily.      gabapentin (NEURONTIN) 100 MG capsule Take 100 mg by mouth 2 (two) times daily.     metFORMIN (GLUCOPHAGE) 500 MG tablet Take 500 mg by mouth daily with breakfast. 2 tablets daily     Multiple Vitamin (MULTI-VITAMINS) TABS Take 1 tablet by mouth daily.     omeprazole (PRILOSEC) 20 MG capsule Take 20 mg by mouth daily.     oxyCODONE (OXY IR/ROXICODONE) 5 MG immediate release tablet Take 1 tablet (5 mg total) by mouth every 6 (six) hours as needed for severe pain. 120 tablet 0   PROAIR HFA 108 (90 Base) MCG/ACT inhaler Inhale 1 puff into the lungs every 6 (six) hours as needed.      sacubitril-valsartan (ENTRESTO) 97-103 MG Take 1 tablet by mouth 2 (two) times daily. 180 tablet 3   simvastatin (ZOCOR) 40 MG tablet Take 1 tablet (40 mg total) by mouth daily. 90 tablet 3   tiotropium (SPIRIVA) 18 MCG inhalation capsule Place 18 mcg into inhaler and inhale daily.      torsemide (DEMADEX) 20 MG tablet TAKE 1 TABLET DAILY, TAKE 1 EXTRA TABLET AT NOON ON MONDAY, WEDNESDAY, AND FRIDAY 130 tablet 3   Current Facility-Administered Medications  Medication Dose Route Frequency Provider Last Rate Last Admin   sodium chloride flush (NS) 0.9 % injection 10 mL  10 mL Intracatheter PRN Mosher, Kelli A, PA-C   10 mL at 11/18/20 1359     ASSESSMENT & PLAN:   Assessment:   1. Stage IIIA non-small cell lung cancer with partial response to concurrent chemoradiation.  He completed 1 year of durvalumab in March 2021 and tolerated this without significant difficulty.  CT imaging in April revealed continued improvement.  PET scan from July 2021 revealed a large area of treated tumor/radiation  fibrosis involving the right lower lobe with no hypermetabolism  to suggest residual or recurrent tumor.  No evidence of metastatic disease was observed.  CT scan from September 2022 revealed unchanged post treatment/post radiation appearance of the right lung, with perihilar fibrosis and consolidation. No evidence of recurrent or metastatic disease.    Plan: We will see him back in clinic in 2-3 months with CBC, CMP and CEA for repeat evaluation.  We will plan repeat scans in the spring.  He verbalizes understanding of and agreement to the plans discussed today. He knows to call the office should any new questions or concerns arise.    Derwood Kaplan, MD [facility] [address] [place] [address] Dept: [phone] Dept Fax: [phone]   I, Rita Ohara, am acting as scribe for Derwood Kaplan, MD  I have reviewed this report as typed by the medical scribe, and it is complete and accurate.

## 2020-11-18 ENCOUNTER — Inpatient Hospital Stay: Payer: Medicare Other

## 2020-11-18 ENCOUNTER — Telehealth: Payer: Self-pay | Admitting: Oncology

## 2020-11-18 ENCOUNTER — Encounter: Payer: Self-pay | Admitting: Oncology

## 2020-11-18 ENCOUNTER — Other Ambulatory Visit: Payer: Self-pay | Admitting: Oncology

## 2020-11-18 ENCOUNTER — Inpatient Hospital Stay: Payer: Medicare Other | Attending: Oncology | Admitting: Oncology

## 2020-11-18 VITALS — BP 117/78 | HR 91 | Temp 98.1°F | Resp 18 | Ht 70.0 in | Wt 237.6 lb

## 2020-11-18 DIAGNOSIS — C3491 Malignant neoplasm of unspecified part of right bronchus or lung: Secondary | ICD-10-CM | POA: Diagnosis not present

## 2020-11-18 DIAGNOSIS — Z85118 Personal history of other malignant neoplasm of bronchus and lung: Secondary | ICD-10-CM | POA: Insufficient documentation

## 2020-11-18 DIAGNOSIS — Z923 Personal history of irradiation: Secondary | ICD-10-CM | POA: Diagnosis not present

## 2020-11-18 DIAGNOSIS — Z9221 Personal history of antineoplastic chemotherapy: Secondary | ICD-10-CM | POA: Insufficient documentation

## 2020-11-18 DIAGNOSIS — Z8546 Personal history of malignant neoplasm of prostate: Secondary | ICD-10-CM | POA: Diagnosis not present

## 2020-11-18 DIAGNOSIS — C3490 Malignant neoplasm of unspecified part of unspecified bronchus or lung: Secondary | ICD-10-CM | POA: Diagnosis not present

## 2020-11-18 DIAGNOSIS — Z452 Encounter for adjustment and management of vascular access device: Secondary | ICD-10-CM | POA: Diagnosis not present

## 2020-11-18 LAB — COMPREHENSIVE METABOLIC PANEL
Albumin: 3.8 (ref 3.5–5.0)
Calcium: 9.1 (ref 8.7–10.7)

## 2020-11-18 LAB — BASIC METABOLIC PANEL
BUN: 15 (ref 4–21)
CO2: 26 — AB (ref 13–22)
Chloride: 107 (ref 99–108)
Creatinine: 1.3 (ref 0.6–1.3)
Glucose: 119
Potassium: 3.8 (ref 3.4–5.3)
Sodium: 143 (ref 137–147)

## 2020-11-18 LAB — HEPATIC FUNCTION PANEL
ALT: 18 (ref 10–40)
AST: 26 (ref 14–40)
Alkaline Phosphatase: 149 — AB (ref 25–125)
Bilirubin, Total: 0.6

## 2020-11-18 LAB — CBC: RBC: 4.25 (ref 3.87–5.11)

## 2020-11-18 LAB — CBC AND DIFFERENTIAL
HCT: 38 — AB (ref 41–53)
Hemoglobin: 12.3 — AB (ref 13.5–17.5)
Neutrophils Absolute: 4.01
Platelets: 190 (ref 150–399)
WBC: 5.9

## 2020-11-18 MED ORDER — HEPARIN SOD (PORK) LOCK FLUSH 100 UNIT/ML IV SOLN
500.0000 [IU] | Freq: Once | INTRAVENOUS | Status: AC | PRN
  Administered 2020-11-18: 500 [IU]

## 2020-11-18 MED ORDER — SODIUM CHLORIDE 0.9% FLUSH
10.0000 mL | INTRAVENOUS | Status: DC | PRN
  Administered 2020-11-18: 10 mL

## 2020-11-18 NOTE — Telephone Encounter (Signed)
Per 11/4 los next appt scheduled and given to patient 

## 2020-11-19 LAB — CEA: CEA: 2 ng/mL (ref 0.0–4.7)

## 2020-11-24 ENCOUNTER — Encounter: Payer: Self-pay | Admitting: Hematology and Oncology

## 2020-12-01 ENCOUNTER — Other Ambulatory Visit: Payer: Self-pay

## 2020-12-01 DIAGNOSIS — G8929 Other chronic pain: Secondary | ICD-10-CM

## 2020-12-01 DIAGNOSIS — M549 Dorsalgia, unspecified: Secondary | ICD-10-CM

## 2020-12-01 MED ORDER — OXYCODONE HCL 5 MG PO TABS: 5.0000 mg | ORAL_TABLET | Freq: Four times a day (QID) | ORAL | 0 refills | Status: DC | PRN

## 2020-12-03 DIAGNOSIS — Z20828 Contact with and (suspected) exposure to other viral communicable diseases: Secondary | ICD-10-CM | POA: Diagnosis not present

## 2020-12-30 ENCOUNTER — Other Ambulatory Visit: Payer: Self-pay

## 2020-12-30 DIAGNOSIS — M25562 Pain in left knee: Secondary | ICD-10-CM

## 2020-12-30 DIAGNOSIS — G8929 Other chronic pain: Secondary | ICD-10-CM

## 2020-12-30 DIAGNOSIS — M549 Dorsalgia, unspecified: Secondary | ICD-10-CM

## 2020-12-30 MED ORDER — OXYCODONE HCL 5 MG PO TABS: 5.0000 mg | ORAL_TABLET | Freq: Four times a day (QID) | ORAL | 0 refills | Status: DC | PRN

## 2021-01-25 ENCOUNTER — Encounter: Payer: Self-pay | Admitting: Hematology and Oncology

## 2021-02-01 ENCOUNTER — Encounter: Payer: Self-pay | Admitting: Hematology and Oncology

## 2021-02-01 ENCOUNTER — Inpatient Hospital Stay: Payer: Medicare Other

## 2021-02-01 ENCOUNTER — Inpatient Hospital Stay: Payer: Medicare Other | Attending: Oncology | Admitting: Hematology and Oncology

## 2021-02-01 ENCOUNTER — Other Ambulatory Visit: Payer: Self-pay

## 2021-02-01 ENCOUNTER — Other Ambulatory Visit: Payer: Self-pay | Admitting: Hematology and Oncology

## 2021-02-01 VITALS — BP 125/74 | HR 79 | Temp 98.2°F | Resp 18 | Ht 70.0 in | Wt 235.0 lb

## 2021-02-01 DIAGNOSIS — M25562 Pain in left knee: Secondary | ICD-10-CM | POA: Diagnosis not present

## 2021-02-01 DIAGNOSIS — Z452 Encounter for adjustment and management of vascular access device: Secondary | ICD-10-CM | POA: Diagnosis not present

## 2021-02-01 DIAGNOSIS — M549 Dorsalgia, unspecified: Secondary | ICD-10-CM | POA: Diagnosis not present

## 2021-02-01 DIAGNOSIS — E119 Type 2 diabetes mellitus without complications: Secondary | ICD-10-CM | POA: Diagnosis not present

## 2021-02-01 DIAGNOSIS — Z7984 Long term (current) use of oral hypoglycemic drugs: Secondary | ICD-10-CM | POA: Diagnosis not present

## 2021-02-01 DIAGNOSIS — Z85118 Personal history of other malignant neoplasm of bronchus and lung: Secondary | ICD-10-CM | POA: Insufficient documentation

## 2021-02-01 DIAGNOSIS — Z923 Personal history of irradiation: Secondary | ICD-10-CM | POA: Diagnosis not present

## 2021-02-01 DIAGNOSIS — Z79899 Other long term (current) drug therapy: Secondary | ICD-10-CM | POA: Diagnosis not present

## 2021-02-01 DIAGNOSIS — C3491 Malignant neoplasm of unspecified part of right bronchus or lung: Secondary | ICD-10-CM

## 2021-02-01 DIAGNOSIS — Z87891 Personal history of nicotine dependence: Secondary | ICD-10-CM | POA: Diagnosis not present

## 2021-02-01 DIAGNOSIS — Z9221 Personal history of antineoplastic chemotherapy: Secondary | ICD-10-CM | POA: Diagnosis not present

## 2021-02-01 DIAGNOSIS — Z794 Long term (current) use of insulin: Secondary | ICD-10-CM | POA: Diagnosis not present

## 2021-02-01 DIAGNOSIS — M25561 Pain in right knee: Secondary | ICD-10-CM | POA: Diagnosis not present

## 2021-02-01 DIAGNOSIS — G8929 Other chronic pain: Secondary | ICD-10-CM | POA: Diagnosis not present

## 2021-02-01 LAB — COMPREHENSIVE METABOLIC PANEL
Albumin: 4 (ref 3.5–5.0)
Calcium: 9 (ref 8.7–10.7)

## 2021-02-01 LAB — CBC AND DIFFERENTIAL
HCT: 40 — AB (ref 41–53)
Hemoglobin: 12.9 — AB (ref 13.5–17.5)
Neutrophils Absolute: 6.28
Platelets: 185 (ref 150–399)
WBC: 8.6

## 2021-02-01 LAB — BASIC METABOLIC PANEL
BUN: 23 — AB (ref 4–21)
CO2: 27 — AB (ref 13–22)
Chloride: 109 — AB (ref 99–108)
Creatinine: 1.5 — AB (ref 0.6–1.3)
Glucose: 99
Potassium: 4.2 (ref 3.4–5.3)
Sodium: 142 (ref 137–147)

## 2021-02-01 LAB — HEPATIC FUNCTION PANEL
ALT: 22 (ref 10–40)
AST: 26 (ref 14–40)
Alkaline Phosphatase: 159 — AB (ref 25–125)
Bilirubin, Total: 0.8

## 2021-02-01 LAB — CBC: RBC: 4.5 (ref 3.87–5.11)

## 2021-02-01 MED ORDER — OXYCODONE HCL 5 MG PO TABS: 5.0000 mg | ORAL_TABLET | Freq: Four times a day (QID) | ORAL | 0 refills | Status: DC | PRN

## 2021-02-01 MED ORDER — SODIUM CHLORIDE 0.9% FLUSH
10.0000 mL | INTRAVENOUS | Status: DC | PRN
  Administered 2021-02-01: 10 mL

## 2021-02-01 MED ORDER — HEPARIN SOD (PORK) LOCK FLUSH 100 UNIT/ML IV SOLN
500.0000 [IU] | Freq: Once | INTRAVENOUS | Status: AC | PRN
  Administered 2021-02-01: 500 [IU]

## 2021-02-01 NOTE — Assessment & Plan Note (Addendum)
Stage IIIA non-small cell lung cancer diagnosed in September 2019.  He had with partial response to concurrent chemoradiation.  He completed 1 year of durvalumab in March 2021. Most recent CT imaging from September 2022 revealed unchanged post treatment/post radiation appearance of the right lung, with perihilar fibrosis and consolidation without evidence of recurrent or metastatic disease.  He remains without evidence of disease. We will plan to see him back in 3 months with a CBC, comprehensive metabolic panel, CEA and CT chest, abdomen and pelvis for repeat clinical assessment.

## 2021-02-01 NOTE — Progress Notes (Signed)
John Maddox  [address] [place],  [place]  [address] [phone]  Clinic Day:  02/01/2021  Referring physician: Renaldo Reel, PA  ASSESSMENT & PLAN:   Assessment & Plan: Non-small cell lung cancer ([place]) Stage IIIA non-small cell lung cancer diagnosed in September 2019.  He had with partial response to concurrent chemoradiation.  He completed 1 year of durvalumab in March 2021. Most recent CT imaging from September 2022 revealed unchanged post treatment/post radiation appearance of the right lung, with perihilar fibrosis and consolidation without evidence of recurrent or metastatic disease.  He remains without evidence of disease. We will plan to see him back in 3 months with a CBC, comprehensive metabolic panel, CEA and CT chest, abdomen and pelvis for repeat clinical assessment.    The patient understands the plans discussed today and is in agreement with them.  He knows to contact our office if he develops concerns prior to his next appointment.     John Pickles, PA-C  [facility] [address] [place] [address] Dept: [phone] Dept Fax: [phone]   Orders Placed This Encounter  Procedures   CT CHEST ABDOMEN PELVIS W CONTRAST    Standing Status:   Future    Standing Expiration Date:   02/01/2022    Order Specific Question:   Preferred imaging location?    Answer:   External    Comments:   [facility]    Order Specific Question:   Radiology Contrast Protocol - do NOT remove file path    Answer:   [email or web address]   CBC with Differential ([place] Only)    Standing Status:   Future    Standing Expiration Date:   02/01/2022   CMP ([place] only)    Standing Status:   Future    Standing Expiration Date:   02/01/2022   CEA    Standing Status:   Future    Standing Expiration Date:   02/01/2022      CHIEF COMPLAINT:   CC: Stage IIIA non-small cell lung cancer  Current Treatment: Observation   HISTORY OF PRESENT ILLNESS:  John Maddox. Nong is a 81 y.o. male with stage IIIA (T3 N1 M0) non-small cell lung cancer diagnosed in September 2019.  He had presented with severe bradycardia and left chest discomfort and was found to have a 6.2 cm mass in the right upper lobe with 1 paratracheal node measuring 1-2 cm.  Bronchoscopy was done and the 1st set of cytology and pathology results were negative.  Bronchial brushings and washings revealed rare atypical cells suspicious for malignancy in 1 specimen and malignant cells definitely consistent with non-small cell carcinoma of the lung, favoring adenocarcinoma, in a 2nd specimen.  We recommended concurrent chemoradiation with weekly carboplatin/paclitaxel.  He has many comorbidities including moderate emphysema, a distal descending thoracic aortic aneurysm measuring 4.4 cm, hypertension, congestive heart failure, diabetes mellitus, chronic kidney disease, multiple aortic aneurysms, peripheral artery disease, and history of prostate cancer 11 years ago.  His Cardiolite scan revealed mild global hypokinesis with an ejection fraction of 37%.  He also has pre-existing peripheral neuropathy.  MRI of the head did not reveal any evidence of intracranial metastasis.  He received 7 cycles of chemotherapy, which was completed in December 2019.  Unfortunately, the patient became quite ill and septic later in December 2019.  He was [facility] from [facility] to [facility].  He was  found to have septic arthritis of his knee and was placed on IV antibiotics at home with daptomycin and ceftriaxone as well as rifampin.  The hemoglobin was 8.0 when he was discharged and continued to drop to 7.3 and then 7.1.  We were eventually able to transfuse him as an inpatient.   When the patient went to see Dr. Linus Maddox of Infectious Disease in February 2020, he collapsed prior to  getting his labs drawn and had cardiorespiratory arrest requiring resuscitation.  He was transported to the emergency room and admitted.  He was found to have acute pulmonary embolism of small to moderate size, but no significant right heart strain.  He had deep venous thrombosis of the right femoral vein, right popliteal vein and right posterior tibial vein.  He was placed on heparin IV and transitioned to apixaban 5 mg twice daily, which he continues.  He was also found to have aspiration pneumonia during that hospitalization. A CT chest revealed the spiculated mass in the posterior right upper lobe was slightly smaller measuring 3.8 cm in maximum diameter, decreased from 4.0 cm.  There was stable right hilar adenopathy measuring 1.4 cm.  There was a small right pleural effusion.   He started maintenance durvalumab in March 2020 and tolerated it well.  The patient had a repeat echocardiogram, which showed improvement in his ejection fraction.  CT chest in early June revealed decrease in the size of the right upper lobe mass to 2.4 cm, with decrease in the pleural effusion.  There was a stable thoracic aortic aneurysm measuring 4.7 cm.  When he was seen in June, he reported intermittent dizziness, but MRI brain did not reveal intracranial metastasis or other acute abnormality.  Repeat CT chest in September 2020 revealed improvement in the size of the right upper lobe pulmonary nodule from 2.4 cm to 1.9 cm, with a possible right lower lobe pneumonia.  CT chest in January 2021 revealed slight interval increase in dense, post treatment fibrotic consolidation and volume loss of the perihilar right upper lobe, which is consistent with maturation of radiation fibrosis.  The previously seen clustered nodularity of the right lower lobe was essentially resolved, consistent with resolved atypical infection or inflammation.  Atherosclerotic, tortuous and aneurysmal thoracic aorta, the distal arch measuring approximately 5.0  cm and the tortuous descending thoracic aorta focally aneurysmal and measuring approximately 5.1 cm in greatest caliber orthogonal to flow was re-demonstrated.  I recommended [employer] testing of the peripheral blood, and these results show no reportable alterations with companion diagnostic claims.  Other biomarkers with potential clinical significance, included DNMT3A and TET2.  He completed a total of 1 year of durvalumab therapy on March 10th.  CT chest in April revealed post treatment changes about the right hilum measuring 6.9 x 2.5 cm, previously 7.4 x 3.4 cm.  There were nodular changes in the left lung base developing along the margin of the aorta measuring 1.7 x 1.1 cm, more conspicuous than on the previous exam.  Stable appearance of the thoracic aorta with tortuosity and dilation.  The upper abdominal aortic aneurysm with mural thrombus is similar in appearance measuring 4.6 cm.  PET imaging from July 2021 revealed a large are of treated tumor/radiation fibrosis involving the right lower lobe.  There was no hypermetabolism to suggest residual or recurrent tumor, no findings for mediastinal/hilar adenopathy or pulmonary metastatic disease.  There was no evidence of abdominal/pelvic metastatic disease or osseous metastatic disease.  Stable, severe vascular disease was seen.  The upper abdominal aorta measures 4.4 cm.  CT from January revealed stable right infrahilar soft tissue at the site of previous treatment.  Decreased size of nodular focus at the left lung base now measuring 13 x 10 mm as compared to 17 x 10 mm. Likely nodular scarring. Tortuous and aneurysmal appearance of the thoracoabdominal aorta with similar appearance.  CT chest, abdomen and pelvis from September revealed unchanged post treatment/post radiation appearance of the right lung, with perihilar fibrosis and consolidation with no evidence of recurrent or metastatic disease. Redemonstrated aneurysmal aortic arch and descending  thoracic aorta, maximum caliber of the distal arch approximately 4.6 x 4.2 cm and of the distal descending thoracic aorta approximately 5.2 x 5.2 cm.  INTERVAL HISTORY:  Geovanny is here today for repeat clinical assessment and continues to do fairly well.  He denies cough, chest pain or progressive dyspnea.  He only intermittently uses portable oxygen.  He has difficulty ambulating due to dyspnea with exertion as well as pain from degenerative disease.  He continues oxycodone 5 mg every 6 hours with fair control of his pain.  He denies fevers or chills.  His appetite is good.  He did not wish to weigh today, but reports his weight to be 235 pounds.  REVIEW OF SYSTEMS:  Review of Systems  Constitutional:  Positive for fatigue. Negative for appetite change, chills, fever and unexpected weight change.  HENT:   Negative for lump/mass, mouth sores and sore throat.   Respiratory:  Positive for shortness of breath (With exertion). Negative for cough.   Cardiovascular:  Negative for chest pain and leg swelling.  Gastrointestinal:  Negative for abdominal pain, constipation, diarrhea, nausea and vomiting.  Genitourinary:  Negative for difficulty urinating, dysuria, frequency and hematuria.   Musculoskeletal:  Positive for arthralgias (Chronic, stable due to degenerative disease) and gait problem (Due to degenerative disease). Negative for back pain and myalgias.  Skin:  Negative for itching, rash and wound.  Neurological:  Positive for gait problem (Due to degenerative disease). Negative for dizziness, extremity weakness, headaches, light-headedness and numbness.  Hematological:  Negative for adenopathy.  Psychiatric/Behavioral:  Negative for depression and sleep disturbance. The patient is not nervous/anxious.     VITALS:  Blood pressure 125/74, pulse 79, temperature 98.2 F (36.8 C), temperature source Oral, resp. rate 18, height '5\' 10"'  (1.778 m), weight 235 lb (106.6 kg), SpO2 99 %.  Wt Readings from  Last 3 Encounters:  02/01/21 235 lb (106.6 kg)  11/18/20 237 lb 9.6 oz (107.8 kg)  09/26/20 237 lb 9.6 oz (107.8 kg)    Body mass index is 33.72 kg/m.  Performance status (ECOG): 2 - Symptomatic, <50% confined to bed  PHYSICAL EXAM:  Physical Exam Vitals and nursing note reviewed.  Constitutional:      General: He is not in acute distress.    Appearance: Normal appearance. He is normal weight.  HENT:     Head: Normocephalic and atraumatic.     Mouth/Throat:     Mouth: Mucous membranes are moist.     Pharynx: Oropharynx is clear. No oropharyngeal exudate or posterior oropharyngeal erythema.  Eyes:     General: No scleral icterus.    Extraocular Movements: Extraocular movements intact.     Conjunctiva/sclera: Conjunctivae normal.     Pupils: Pupils are equal, round, and reactive to light.  Cardiovascular:     Rate and Rhythm: Normal rate and regular rhythm.     Heart sounds: Normal heart sounds. No murmur heard.  No friction rub. No gallop.  Pulmonary:     Effort: Pulmonary effort is normal.     Breath sounds: Normal breath sounds. No wheezing, rhonchi or rales.  Abdominal:     General: Bowel sounds are normal. There is no distension.     Palpations: Abdomen is soft. There is no hepatomegaly, splenomegaly or mass.     Tenderness: There is no abdominal tenderness.  Musculoskeletal:        General: Normal range of motion.     Cervical back: Normal range of motion and neck supple. No tenderness.     Right lower leg: Edema (1+) present.     Left lower leg: Edema (1+) present.  Lymphadenopathy:     Cervical: No cervical adenopathy.     Upper Body:     Right upper body: No supraclavicular or axillary adenopathy.     Left upper body: No supraclavicular or axillary adenopathy.     Lower Body: No right inguinal adenopathy. No left inguinal adenopathy.  Skin:    General: Skin is warm and dry.     Coloration: Skin is not jaundiced.     Findings: No rash.  Neurological:      Mental Status: He is alert and oriented to person, place, and time.     Cranial Nerves: No cranial nerve deficit.  Psychiatric:        Mood and Affect: Mood normal.        Behavior: Behavior normal.        Thought Content: Thought content normal.    LABS:   CBC Latest Ref Rng & Units 02/01/2021 11/18/2020 11/09/2020  WBC - 8.6 5.9 7.6  Hemoglobin 13.5 - 17.5 12.9(A) 12.3(A) 12.9(A)  Hematocrit 41 - 53 40(A) 38(A) 38(A)  Platelets 150 - 399 185 190 178   CMP Latest Ref Rng & Units 02/01/2021 11/18/2020 11/09/2020  Glucose 70 - 99 mg/dL - - -  BUN 4 - 21 23(A) 15 19  Creatinine 0.6 - 1.3 1.5(A) 1.3 1.2  Sodium 137 - 147 142 143 141  Potassium 3.4 - 5.3 4.2 3.8 4.3  Chloride 99 - 108 109(A) 107 101  CO2 13 - 22 27(A) 26(A) 24(A)  Calcium 8.7 - 10.7 9.0 9.1 9.9  Total Protein 6.3 - 8.52 g/dL - - -  Alkaline Phos 25 - 125 159(A) 149(A) 177(A)  AST 14 - 40 '26 26 20  ' ALT 10 - 40 '22 18 15     ' Lab Results  Component Value Date   CEA1 2.0 11/18/2020   /  CEA  Date Value Ref Range Status  11/18/2020 2.0 0.0 - 4.7 ng/mL Final    Comment:    (NOTE)                             Nonsmokers          <3.9                             Smokers             <5.6 [employer] Electrochemiluminescence Immunoassay (ECLIA) Values obtained with different assay methods or kits cannot be used interchangeably.  Results cannot be interpreted as absolute evidence of the presence or absence of malignant disease. Performed At: [facility] [place], [place] [id number] Rush Farmer MD KK:[id number]    No results found for: PSA1  No results found for: MPN361 No results found for: CAN125  No results found for: TOTALPROTELP, ALBUMINELP, A1GS, A2GS, BETS, BETA2SER, GAMS, MSPIKE, SPEI No results found for: TIBC, FERRITIN, IRONPCTSAT No results found for: LDH  STUDIES:  No results found.    HISTORY:   Past Medical History:  Diagnosis Date   AAA (abdominal aortic  aneurysm) without rupture 08/03/2015   AKI (acute kidney injury) ([place]) 01/08/2018   Last Assessment & Plan:  Improved Nephrology consulted Likely 2/2 ATN and CIN in the setting of contrast at the outside hospital on 12/21, NSAIDs at the outside hospital, intra-operative hypotension, and entresto use. Avoid nephrotoxic medications(entresto/diuretic) Renally dose medications Strict I&O Monitor creatinine(1.68 today)     Anemia 01/17/2018   Last Assessment & Plan:  Due to chemotherapy and anemia of chronic disease Iron panel shows anemia of chronic disease Follow up with PCP and oncologist   Arrhythmia    Asthma 09/23/2017   Atherosclerosis of native artery of both lower extremities ([place]) 08/03/2015   Bilateral carotid artery stenosis 08/03/2015   Cardiomyopathy ([place]) 11/18/2017   Chronic anticoagulation 06/05/2018   Chronic combined systolic and diastolic heart failure ([place]) 10/03/2017   Congestive heart failure ([place]) 09/23/2017   COPD (chronic obstructive pulmonary disease) ([place]) 09/23/2017   Diabetes mellitus ([place]) 09/23/2017   Diarrhea 01/14/2018   Last Assessment & Plan:  C diff - negative  GIP - negative Resolved   Diastolic dysfunction 04/18/3152   Duodenal nodule 11/10/2014   Esophageal reflux 09/23/2017   Essential hypertension 11/10/2014   Last Assessment & Plan:  Controlled off of medication Follow up with PCP as an outpatient   Fatigue 09/23/2017   Gout 09/23/2017   Hematuria 01/14/2018   Last Assessment & Plan:  Need UA as outpatient.   History of prostate cancer 09/23/2017   Hyperlipidemia 09/23/2017   Hypertension 09/23/2017   Hypertensive heart disease 09/23/2017   Hypertensive heart disease with heart failure ([place]) 06/06/2018   Hypokalemia 01/14/2018   Last Assessment & Plan:  Replace and follow up with PCP   Hypomagnesemia 01/14/2018   Last Assessment & Plan:  Replace and monitor and follow up with PCP   Hypophosphatemia 01/15/2018   Last Assessment & Plan:  Resolved   Infected prosthetic knee  joint, initial encounter ([place]) 03/06/2018   Insulin dependent type 2 diabetes mellitus ([place]) 11/10/2014   Last Assessment & Plan:  Glucose 109-140 Continue ISS Monitor glucose   Left ventricular dysfunction 10/03/2017   Lung cancer ([place])    Lung mass 10/04/2017   Non-small cell lung cancer ([place]) 10/03/2017   Pulmonary emboli ([place]) 03/06/2018   Pulmonary embolism ([facility])    PVC's (premature ventricular contractions) 09/23/2017   PVD (peripheral vascular disease) ([place]) 11/10/2014   Septic arthritis of knee, left ([place]) 01/04/2018   Added automatically from request for surgery [id number]  Last Assessment & Plan:  Prosthetic knee infection S/p wash out Culture negative  Continue Dapto + Ceftriaxone + Rifampin for 4 weeks(currently day 7) ID was notified today prior to discharge He will need CBC, CMP, ESR, CRP weekly faxed to [phone](OPAT with ID) ID outpatient appointment is on 02/17/2017  Antibiotics administered by port by ho   Thrombocytopenia ([place]) 01/15/2018   Last Assessment & Plan:  Decreased to 78.  Due to chemotherapy Peripheral smear negative for schistocytes LDH 211(within normal limits), haptoglobin 329(elevated), Retic 1.7 B12=714, Folate=20, copper pending    Past Surgical History:  Procedure Laterality Date   ABDOMINAL AORTIC ANEURYSM REPAIR  AORTA - ILIAC ARTERY BYPASS GRAFT     BACK SURGERY     CATARACT EXTRACTION, BILATERAL     CHOLECYSTECTOMY     HEMICOLECTOMY     HERNIA REPAIR     KNEE SURGERY Left    replaced all the hardwear   PROSTATE SURGERY     REPLACEMENT TOTAL KNEE     STENT PLACEMENT VASCULAR ([place] HX)     TONSILLECTOMY      Family History  Problem Relation Age of Onset   Arthritis Mother    Lung cancer Mother    Hypertension Mother    Arthritis Father    Heart attack Father    Prostate cancer Father    Hypertension Father    Hyperlipidemia Father    Heart disease Father    Alcohol abuse Father    Arthritis Brother    Lung cancer Brother     Social  History:  reports that he quit smoking about 9 years ago. His smoking use included cigarettes. He has a 100.00 pack-year smoking history. He has never used smokeless tobacco. He reports that he does not currently use alcohol. He reports that he does not currently use drugs.The patient is accompanied by his wife today.  Allergies:  Allergies  Allergen Reactions   Lantus [Insulin Glargine]     Stomach pain   Quinolones Other (See Comments)    History of aortic aneurysm status post repair.  Use with caution    Current Medications: Current Outpatient Medications  Medication Sig Dispense Refill   allopurinol (ZYLOPRIM) 300 MG tablet Take 300 mg by mouth daily.     apixaban (ELIQUIS) 5 MG TABS tablet Take 1 tablet (5 mg total) by mouth 2 (two) times daily. 180 tablet 3   carvedilol (COREG) 3.125 MG tablet Take 1 tablet (3.125 mg total) by mouth 2 (two) times daily. 180 tablet 3   celecoxib (CELEBREX) 200 MG capsule Take 1 capsule (200 mg total) by mouth 2 (two) times daily. 180 capsule 3   fluticasone-salmeterol (ADVAIR HFA) 115-21 MCG/ACT inhaler Inhale 2 puffs into the lungs 2 (two) times daily.      gabapentin (NEURONTIN) 100 MG capsule Take 100 mg by mouth 2 (two) times daily.     metFORMIN (GLUCOPHAGE) 500 MG tablet Take 500 mg by mouth daily with breakfast. 2 tablets daily     Multiple Vitamin (MULTI-VITAMINS) TABS Take 1 tablet by mouth daily.     omeprazole (PRILOSEC) 20 MG capsule Take 20 mg by mouth daily.     oxyCODONE (OXY IR/ROXICODONE) 5 MG immediate release tablet Take 1 tablet (5 mg total) by mouth every 6 (six) hours as needed for severe pain. 120 tablet 0   PROAIR HFA 108 (90 Base) MCG/ACT inhaler Inhale 1 puff into the lungs every 6 (six) hours as needed.      sacubitril-valsartan (ENTRESTO) 97-103 MG Take 1 tablet by mouth 2 (two) times daily. 180 tablet 3   simvastatin (ZOCOR) 40 MG tablet Take 1 tablet (40 mg total) by mouth daily. 90 tablet 3   tiotropium (SPIRIVA) 18 MCG  inhalation capsule Place 18 mcg into inhaler and inhale daily.      torsemide (DEMADEX) 20 MG tablet TAKE 1 TABLET DAILY, TAKE 1 EXTRA TABLET AT NOON ON MONDAY, WEDNESDAY, AND FRIDAY 130 tablet 3   Current Facility-Administered Medications  Medication Dose Route Frequency Provider Last Rate Last Admin   sodium chloride flush (NS) 0.9 % injection 10 mL  10 mL Intracatheter PRN  Siah Steely A, PA-C   10 mL at 02/01/21 1358

## 2021-02-02 ENCOUNTER — Encounter: Payer: Self-pay | Admitting: Hematology and Oncology

## 2021-02-02 LAB — CEA: CEA: 2.1 ng/mL (ref 0.0–4.7)

## 2021-02-03 DIAGNOSIS — N138 Other obstructive and reflux uropathy: Secondary | ICD-10-CM

## 2021-02-03 DIAGNOSIS — C61 Malignant neoplasm of prostate: Secondary | ICD-10-CM | POA: Diagnosis not present

## 2021-02-03 DIAGNOSIS — N401 Enlarged prostate with lower urinary tract symptoms: Secondary | ICD-10-CM | POA: Diagnosis not present

## 2021-02-03 DIAGNOSIS — N471 Phimosis: Secondary | ICD-10-CM | POA: Diagnosis not present

## 2021-02-03 HISTORY — DX: Other obstructive and reflux uropathy: N13.8

## 2021-02-16 DIAGNOSIS — J449 Chronic obstructive pulmonary disease, unspecified: Secondary | ICD-10-CM | POA: Diagnosis not present

## 2021-02-16 DIAGNOSIS — J9611 Chronic respiratory failure with hypoxia: Secondary | ICD-10-CM | POA: Diagnosis not present

## 2021-02-16 DIAGNOSIS — I700 Atherosclerosis of aorta: Secondary | ICD-10-CM | POA: Diagnosis not present

## 2021-02-16 DIAGNOSIS — T451X5A Adverse effect of antineoplastic and immunosuppressive drugs, initial encounter: Secondary | ICD-10-CM | POA: Diagnosis not present

## 2021-02-16 DIAGNOSIS — G620 Drug-induced polyneuropathy: Secondary | ICD-10-CM | POA: Diagnosis not present

## 2021-02-16 DIAGNOSIS — I714 Abdominal aortic aneurysm, without rupture, unspecified: Secondary | ICD-10-CM | POA: Diagnosis not present

## 2021-02-16 DIAGNOSIS — Z86711 Personal history of pulmonary embolism: Secondary | ICD-10-CM | POA: Diagnosis not present

## 2021-02-16 DIAGNOSIS — I10 Essential (primary) hypertension: Secondary | ICD-10-CM | POA: Diagnosis not present

## 2021-02-16 DIAGNOSIS — E1169 Type 2 diabetes mellitus with other specified complication: Secondary | ICD-10-CM | POA: Diagnosis not present

## 2021-02-16 DIAGNOSIS — M109 Gout, unspecified: Secondary | ICD-10-CM | POA: Diagnosis not present

## 2021-02-16 DIAGNOSIS — E785 Hyperlipidemia, unspecified: Secondary | ICD-10-CM | POA: Diagnosis not present

## 2021-02-16 DIAGNOSIS — I5040 Unspecified combined systolic (congestive) and diastolic (congestive) heart failure: Secondary | ICD-10-CM | POA: Diagnosis not present

## 2021-02-21 DIAGNOSIS — E669 Obesity, unspecified: Secondary | ICD-10-CM | POA: Diagnosis not present

## 2021-02-21 DIAGNOSIS — J449 Chronic obstructive pulmonary disease, unspecified: Secondary | ICD-10-CM | POA: Diagnosis not present

## 2021-02-21 DIAGNOSIS — I5032 Chronic diastolic (congestive) heart failure: Secondary | ICD-10-CM | POA: Diagnosis not present

## 2021-02-22 DIAGNOSIS — E875 Hyperkalemia: Secondary | ICD-10-CM | POA: Diagnosis not present

## 2021-02-23 DIAGNOSIS — R9431 Abnormal electrocardiogram [ECG] [EKG]: Secondary | ICD-10-CM | POA: Diagnosis not present

## 2021-02-23 DIAGNOSIS — I252 Old myocardial infarction: Secondary | ICD-10-CM | POA: Diagnosis not present

## 2021-02-23 DIAGNOSIS — Z01810 Encounter for preprocedural cardiovascular examination: Secondary | ICD-10-CM | POA: Diagnosis not present

## 2021-02-23 DIAGNOSIS — Z87891 Personal history of nicotine dependence: Secondary | ICD-10-CM | POA: Diagnosis not present

## 2021-02-23 DIAGNOSIS — I440 Atrioventricular block, first degree: Secondary | ICD-10-CM | POA: Diagnosis not present

## 2021-02-24 DIAGNOSIS — Z9889 Other specified postprocedural states: Secondary | ICD-10-CM | POA: Diagnosis not present

## 2021-02-24 DIAGNOSIS — N138 Other obstructive and reflux uropathy: Secondary | ICD-10-CM | POA: Diagnosis not present

## 2021-02-24 DIAGNOSIS — R338 Other retention of urine: Secondary | ICD-10-CM | POA: Diagnosis not present

## 2021-02-24 DIAGNOSIS — Z9289 Personal history of other medical treatment: Secondary | ICD-10-CM | POA: Diagnosis not present

## 2021-02-24 DIAGNOSIS — Z87891 Personal history of nicotine dependence: Secondary | ICD-10-CM | POA: Diagnosis not present

## 2021-02-24 DIAGNOSIS — N471 Phimosis: Secondary | ICD-10-CM | POA: Diagnosis not present

## 2021-02-24 DIAGNOSIS — N401 Enlarged prostate with lower urinary tract symptoms: Secondary | ICD-10-CM | POA: Diagnosis not present

## 2021-02-24 DIAGNOSIS — R3912 Poor urinary stream: Secondary | ICD-10-CM | POA: Diagnosis not present

## 2021-02-24 DIAGNOSIS — Z8546 Personal history of malignant neoplasm of prostate: Secondary | ICD-10-CM | POA: Diagnosis not present

## 2021-03-06 ENCOUNTER — Other Ambulatory Visit: Payer: Self-pay | Admitting: Cardiology

## 2021-03-06 ENCOUNTER — Other Ambulatory Visit: Payer: Self-pay

## 2021-03-06 DIAGNOSIS — G8929 Other chronic pain: Secondary | ICD-10-CM

## 2021-03-06 MED ORDER — OXYCODONE HCL 5 MG PO TABS: 5.0000 mg | ORAL_TABLET | Freq: Four times a day (QID) | ORAL | 0 refills | Status: DC | PRN

## 2021-03-08 ENCOUNTER — Telehealth: Payer: Self-pay

## 2021-03-08 ENCOUNTER — Other Ambulatory Visit: Payer: Self-pay | Admitting: Hematology and Oncology

## 2021-03-08 DIAGNOSIS — G8929 Other chronic pain: Secondary | ICD-10-CM

## 2021-03-08 DIAGNOSIS — M549 Dorsalgia, unspecified: Secondary | ICD-10-CM

## 2021-03-08 MED ORDER — OXYCODONE HCL 5 MG PO TABS: 5.0000 mg | ORAL_TABLET | Freq: Four times a day (QID) | ORAL | 0 refills | Status: DC | PRN

## 2021-03-08 NOTE — Telephone Encounter (Addendum)
I notified pt's wife that [facility] ----- Message from Marvia Pickles, PA-C sent at 03/08/2021  1:08 PM EST ----- Regarding: RE: Oxycodone needs to be sent to [facility], I thought he used [facility] in [place] for local pharm, so that is weird. [facility] ----- Message ----- From: Dairl Ponder, RN Sent: 03/08/2021  12:17 PM EST To: Marvia Pickles, PA-C Subject: Oxycodone needs to be sent to [facility]  Pt called to ask that we send [facility]cod[facility] to [facility]. It was sent to [facility].

## 2021-03-13 DIAGNOSIS — Z20822 Contact with and (suspected) exposure to covid-19: Secondary | ICD-10-CM | POA: Diagnosis not present

## 2021-03-19 NOTE — Progress Notes (Signed)
Cardiology Office Note:    Date:  03/21/2021   ID:  John Maddox. Hildreth, John Maddox 1940-11-01, MRN [id number]  PCP:  Nicoletta Dress, MD  Cardiologist:  Shirlee More, MD    Referring MD: Renaldo Reel, [place]    ASSESSMENT:    1. Chest pain of uncertain etiology   2. Hypertensive heart disease with heart failure ([place])   3. Non-small cell lung cancer, unspecified laterality ([place])   4. Chronic anticoagulation   5. Mixed hyperlipidemia   6. Aneurysm of descending thoracic aorta without rupture    PLAN:    In order of problems listed above:  He has a new problem and it is very localized point tender chest wall pain raising concern of rib fracture or metastatic disease, I will think he requires cardiac enzymes I will send him to get an x-ray ribs left-sided chest x-ray performed. Stable no fluid overload continue his diuretic beta-blocker low-dose and Entresto with lightheadedness have asked his wife to check blood pressure sitting and standing for 2 weeks. Stable followed by oncology he will be having a follow-up CT of his chest also showed a cyst thoracic aorta Continue long-term anticoagulation Lipids are at target continue statin Pending follow-up CT scan by oncology March   Next appointment: 6 months   Medication Adjustments/Labs and Tests Ordered: Current medicines are reviewed at length with the patient today.  Concerns regarding medicines are outlined above.  No orders of the defined types were placed in this encounter.  No orders of the defined types were placed in this encounter.   Routine follow-up for heart failure, recently is having sharp focal left chest pain   History of Present Illness:    John Bayona. Maddox is a 81 y.o. male with a hx of  heart failure previously mildly reduced ejection fraction 04/21/2020 most recent 36 to 60% venous thromboembolism on long-term anticoagulation with non-small cell lung cancer hypertensive heart disease with heart failure  hyperlipidemia and a PEA arrest February 2020 due to pulmonary embolism.  He follows with [facility] pulmonary for severe COPD and oncology for his lung cancer.  Other problems include distal thoracic aortic aneurysm 5 cm and CKD stage II.  He was last seen 09/26/2020 improved compensated heart failure with recovered ejection fraction.[name]  He complains of very focal sharp positional tender to the touch left rib pain as marked the chest syndrome for both the chest x-ray and rib x-rays. He is not having edema shortness of breath angina palpitation or syncope. He remains anticoagulated with his previous massive pulmonary embolism He complains of lightheadedness I have asked him and his wife to check his blood pressure at home sitting and standing daily for 2 weeks and either send a list with MyChart or drop them off in the office He continues on his statin without muscle pain or weakness  Compliance with diet, lifestyle and medications: Yes Past Medical History:  Diagnosis Date   AAA (abdominal aortic aneurysm) without rupture 08/03/2015   AKI (acute kidney injury) ([place]) 01/08/2018   Last Assessment & Plan:  Improved Nephrology consulted Likely 2/2 ATN and CIN in the setting of contrast at the outside hospital on 12/21, NSAIDs at the outside hospital, intra-operative hypotension, and entresto use. Avoid nephrotoxic medications(entresto/diuretic) Renally dose medications Strict I&O Monitor creatinine(1.68 today)     Anemia 01/17/2018   Last Assessment & Plan:  Due to chemotherapy and anemia of chronic disease Iron panel shows anemia of chronic disease Follow up with PCP  and oncologist   Arrhythmia    Asthma 09/23/2017   Atherosclerosis of native artery of both lower extremities ([place]) 08/03/2015   Bilateral carotid artery stenosis 08/03/2015   Cardiomyopathy ([place]) 11/18/2017   Chronic anticoagulation 06/05/2018   Chronic combined systolic and diastolic heart failure ([place]) 10/03/2017   Congestive heart  failure ([place]) 09/23/2017   COPD (chronic obstructive pulmonary disease) ([place]) 09/23/2017   Diabetes mellitus ([place]) 09/23/2017   Diarrhea 01/14/2018   Last Assessment & Plan:  C diff - negative  GIP - negative Resolved   Diastolic dysfunction 05/20/3746   Duodenal nodule 11/10/2014   Esophageal reflux 09/23/2017   Essential hypertension 11/10/2014   Last Assessment & Plan:  Controlled off of medication Follow up with PCP as an outpatient   Fatigue 09/23/2017   Gout 09/23/2017   Hematuria 01/14/2018   Last Assessment & Plan:  Need UA as outpatient.   History of prostate cancer 09/23/2017   Hyperlipidemia 09/23/2017   Hypertension 09/23/2017   Hypertensive heart disease 09/23/2017   Hypertensive heart disease with heart failure ([place]) 06/06/2018   Hypokalemia 01/14/2018   Last Assessment & Plan:  Replace and follow up with PCP   Hypomagnesemia 01/14/2018   Last Assessment & Plan:  Replace and monitor and follow up with PCP   Hypophosphatemia 01/15/2018   Last Assessment & Plan:  Resolved   Infected prosthetic knee joint, initial encounter ([place]) 03/06/2018   Insulin dependent type 2 diabetes mellitus ([place]) 11/10/2014   Last Assessment & Plan:  Glucose 109-140 Continue ISS Monitor glucose   Left ventricular dysfunction 10/03/2017   Lung cancer ([place])    Lung mass 10/04/2017   Non-small cell lung cancer ([place]) 10/03/2017   Pulmonary emboli ([place]) 03/06/2018   Pulmonary embolism ([facility])    PVC's (premature ventricular contractions) 09/23/2017   PVD (peripheral vascular disease) ([place]) 11/10/2014   Septic arthritis of knee, left ([employer]) 01/04/2018   Added automatically from request for surgery [id number]  Last Assessment & Plan:  Prosthetic knee infection S/p wash out Culture negative  Continue Dapto + Ceftriaxone + Rifampin for 4 weeks(currently day 7) ID was notified today prior to discharge He will need CBC, CMP, ESR, CRP weekly faxed to [phone](OPAT with ID) ID outpatient appointment is on 02/17/2017  Antibiotics  administered by port by ho   Thrombocytopenia ([place]) 01/15/2018   Last Assessment & Plan:  Decreased to 78.  Due to chemotherapy Peripheral smear negative for schistocytes LDH 211(within normal limits), haptoglobin 329(elevated), Retic 1.7 B12=714, Folate=20, copper pending    Past Surgical History:  Procedure Laterality Date   ABDOMINAL AORTIC ANEURYSM REPAIR     AORTA - ILIAC ARTERY BYPASS GRAFT     BACK SURGERY     CATARACT EXTRACTION, BILATERAL     CHOLECYSTECTOMY     HEMICOLECTOMY     HERNIA REPAIR     KNEE SURGERY Left    replaced all the hardwear   PROSTATE SURGERY     REPLACEMENT TOTAL KNEE     STENT PLACEMENT VASCULAR ([place] HX)     TONSILLECTOMY      Current Medications: Current Meds  Medication Sig   allopurinol (ZYLOPRIM) 300 MG tablet Take 300 mg by mouth daily.   apixaban (ELIQUIS) 5 MG TABS tablet Take 1 tablet (5 mg total) by mouth 2 (two) times daily.   carvedilol (COREG) 3.125 MG tablet Take 1 tablet (3.125 mg total) by mouth 2 (two) times daily.   ENTRESTO 97-103 MG TAKE 1 TABLET TWICE A  DAY   fluticasone-salmeterol (ADVAIR HFA) 115-21 MCG/ACT inhaler Inhale 2 puffs into the lungs 2 (two) times daily.    gabapentin (NEURONTIN) 100 MG capsule Take 100 mg by mouth 2 (two) times daily.   metFORMIN (GLUCOPHAGE) 500 MG tablet Take 500 mg by mouth daily with breakfast. 2 tablets daily   omeprazole (PRILOSEC) 20 MG capsule Take 20 mg by mouth daily.   oxyCODONE (OXY IR/ROXICODONE) 5 MG immediate release tablet Take 1 tablet (5 mg total) by mouth every 6 (six) hours as needed for severe pain.   PROAIR HFA 108 (90 Base) MCG/ACT inhaler Inhale 1 puff into the lungs every 6 (six) hours as needed.    simvastatin (ZOCOR) 40 MG tablet Take 1 tablet (40 mg total) by mouth daily.   tamsulosin (FLOMAX) 0.4 MG CAPS capsule Take 0.4 mg by mouth daily.   tiotropium (SPIRIVA) 18 MCG inhalation capsule Place 18 mcg into inhaler and inhale daily.    torsemide (DEMADEX) 20 MG tablet TAKE  1 TABLET DAILY, TAKE 1 EXTRA TABLET AT NOON ON MONDAY, WEDNESDAY, AND FRIDAY     Allergies:   Lantus [insulin glargine] and Quinolones   Social History   Socioeconomic History   Marital status: Married    Spouse name: Not on file   Number of children: Not on file   Years of education: Not on file   Highest education level: Not on file  Occupational History   Not on file  Tobacco Use   Smoking status: Former    Packs/day: 2.50    Years: 40.00    Pack years: 100.00    Types: Cigarettes    Quit date: 2014    Years since quitting: 9.1   Smokeless tobacco: Never  Vaping Use   Vaping Use: Never used  Substance and Sexual Activity   Alcohol use: Not Currently   Drug use: Not Currently   Sexual activity: Not on file  Other Topics Concern   Not on file  Social History Narrative   Not on file   Social Determinants of Health   Financial Resource Strain: Not on file  Food Insecurity: Not on file  Transportation Needs: Not on file  Physical Activity: Not on file  Stress: Not on file  Social Connections: Not on file     Family History: The patient'  family history includes Alcohol abuse in his father; Arthritis in his brother, father, and mother; Heart attack in his father; Heart disease in his father; Hyperlipidemia in his father; Hypertension in his father and mother; Lung cancer in his brother and mother; Prostate cancer in his father. ROS:   Please see the history of present illness.    All other systems reviewed and are negative.  EKGs/Labs/Other Studies Reviewed:    The following studies were reviewed today:  EKG:  EKG ordered today and personally reviewed.  The ekg ordered today demonstrates sinus rhythm old inferior and septal MI  Recent Labs:  02/16/2021: Cholesterol 144 LDL 83 triglycerides 122 HDL 39 A1c 6.0% hemoglobin 12.9 creatinine 1.24 potassium 4.1  02/01/2021: ALT 22; BUN 23; Creatinine 1.5; Hemoglobin 12.9; Platelets 185; Potassium 4.2; Sodium 142   Recent Lipid Panel No results found for: CHOL, TRIG, HDL, CHOLHDL, VLDL, LDLCALC, LDLDIRECT  Physical Exam:    VS:  BP 110/60    Pulse 87    Ht '5\' 10"'  (1.778 m)    Wt 227 lb (103 kg)    SpO2 98%    BMI 32.57 kg/m  Wt Readings from Last 3 Encounters:  03/21/21 227 lb (103 kg)  02/01/21 235 lb (106.6 kg)  11/18/20 237 lb 9.6 oz (107.8 kg)     GEN:  Well nourished, well developed in no acute distress HEENT: Normal NECK: No JVD; No carotid bruits LYMPHATICS: No lymphadenopathy CARDIAC: RRR, no murmurs, rubs, gallops five-point tender over the left lateral rib, marked distress to be seen for x-ray regarding fracture RESPIRATORY:  Clear to auscultation without rales, wheezing or rhonchi  ABDOMEN: Soft, non-tender, non-distended MUSCULOSKELETAL:  No edema; No deformity  SKIN: Warm and dry NEUROLOGIC:  [place] and oriented x 3 PSYCHIATRIC:  Normal affect    Signed, Shirlee More, MD  03/21/2021 10:45 AM    [place]

## 2021-03-21 ENCOUNTER — Ambulatory Visit (INDEPENDENT_AMBULATORY_CARE_PROVIDER_SITE_OTHER): Payer: Medicare Other | Admitting: Cardiology

## 2021-03-21 ENCOUNTER — Encounter: Payer: Self-pay | Admitting: Cardiology

## 2021-03-21 ENCOUNTER — Other Ambulatory Visit: Payer: Self-pay

## 2021-03-21 VITALS — BP 110/60 | HR 87 | Ht 70.0 in | Wt 227.0 lb

## 2021-03-21 DIAGNOSIS — Z7901 Long term (current) use of anticoagulants: Secondary | ICD-10-CM

## 2021-03-21 DIAGNOSIS — I7123 Aneurysm of the descending thoracic aorta, without rupture: Secondary | ICD-10-CM

## 2021-03-21 DIAGNOSIS — C3490 Malignant neoplasm of unspecified part of unspecified bronchus or lung: Secondary | ICD-10-CM

## 2021-03-21 DIAGNOSIS — E782 Mixed hyperlipidemia: Secondary | ICD-10-CM

## 2021-03-21 DIAGNOSIS — R0782 Intercostal pain: Secondary | ICD-10-CM | POA: Diagnosis not present

## 2021-03-21 DIAGNOSIS — R079 Chest pain, unspecified: Secondary | ICD-10-CM

## 2021-03-21 DIAGNOSIS — J439 Emphysema, unspecified: Secondary | ICD-10-CM | POA: Diagnosis not present

## 2021-03-21 DIAGNOSIS — I110 Hypertensive heart disease with heart failure: Secondary | ICD-10-CM

## 2021-03-21 NOTE — Patient Instructions (Signed)
Medication Instructions:  ?Your physician recommends that you continue on your current medications as directed. Please refer to the Current Medication list given to you today. ? ?*If you need a refill on your cardiac medications before your next appointment, please call your pharmacy* ? ? ?Lab Work: ?NONE ?If you have labs (blood work) drawn today and your tests are completely normal, you will receive your results only by: ?MyChart Message (if you have MyChart) OR ?A paper copy in the mail ?If you have any lab test that is abnormal or we need to change your treatment, we will call you to review the results. ? ? ?Testing/Procedures: ?Chest and Rib X-ray to be done at [facility] ? ? ?Follow-Up: ?At [facility], you and your health needs are our priority.  As part of our continuing mission to provide you with exceptional heart care, we have created designated Provider Care Teams.  These Care Teams include your primary Cardiologist (physician) and Advanced Practice Providers (APPs -  Physician Assistants and Nurse Practitioners) who all work together to provide you with the care you need, when you need it. ? ?We recommend signing up for the patient portal called "MyChart".  Sign up information is provided on this After Visit Summary.  MyChart is used to connect with patients for Virtual Visits (Telemedicine).  Patients are able to view lab/test results, encounter notes, upcoming appointments, etc.  Non-urgent messages can be sent to your provider as well.   ?To learn more about what you can do with MyChart, go to [email or web address].   ? ?Your next appointment:   ?6 month(s) ? ?The format for your next appointment:   ?In Person ? ?Provider:   ?Shirlee More, MD  ? ? ?Other Instructions ?Check BP daily while sitting and standing for 2 weeks and bring by results or send in mychart to Dr. Bettina Gavia ?Please put name and date of birth on the BP log  ?

## 2021-03-29 ENCOUNTER — Telehealth: Payer: Self-pay

## 2021-03-29 NOTE — Telephone Encounter (Signed)
Called patient regarding Left ribs and chest x-ray done 03/21/2021 . Patient was not available spoke with Velva Harman patients wife. Colon Flattery that Dr Bettina Gavia reviewed result of x-ray that noted left rib fracture and also changes of emphysema. Wife voiced understanding and thanked me for the call. ?

## 2021-03-31 ENCOUNTER — Ambulatory Visit: Payer: Medicare Other | Admitting: Cardiology

## 2021-04-03 ENCOUNTER — Other Ambulatory Visit: Payer: Self-pay

## 2021-04-03 DIAGNOSIS — G8929 Other chronic pain: Secondary | ICD-10-CM

## 2021-04-03 MED ORDER — OXYCODONE HCL 5 MG PO TABS: 5.0000 mg | ORAL_TABLET | Freq: Four times a day (QID) | ORAL | 0 refills | Status: DC | PRN

## 2021-04-07 DIAGNOSIS — Z20822 Contact with and (suspected) exposure to covid-19: Secondary | ICD-10-CM | POA: Diagnosis not present

## 2021-04-18 DIAGNOSIS — Z20822 Contact with and (suspected) exposure to covid-19: Secondary | ICD-10-CM | POA: Diagnosis not present

## 2021-04-21 DIAGNOSIS — Z20822 Contact with and (suspected) exposure to covid-19: Secondary | ICD-10-CM | POA: Diagnosis not present

## 2021-04-25 ENCOUNTER — Telehealth: Payer: Self-pay

## 2021-04-25 NOTE — Telephone Encounter (Signed)
Dr Shirlee More reviewed patients home blood pressure reading and noted BP's good. Copy of results sent for scanning ?

## 2021-04-27 DIAGNOSIS — R051 Acute cough: Secondary | ICD-10-CM | POA: Diagnosis not present

## 2021-04-27 DIAGNOSIS — R059 Cough, unspecified: Secondary | ICD-10-CM | POA: Diagnosis not present

## 2021-04-27 DIAGNOSIS — Z20822 Contact with and (suspected) exposure to covid-19: Secondary | ICD-10-CM | POA: Diagnosis not present

## 2021-05-01 ENCOUNTER — Other Ambulatory Visit: Payer: Self-pay

## 2021-05-01 DIAGNOSIS — C3490 Malignant neoplasm of unspecified part of unspecified bronchus or lung: Secondary | ICD-10-CM | POA: Diagnosis not present

## 2021-05-01 DIAGNOSIS — C3491 Malignant neoplasm of unspecified part of right bronchus or lung: Secondary | ICD-10-CM | POA: Diagnosis not present

## 2021-05-01 DIAGNOSIS — G8929 Other chronic pain: Secondary | ICD-10-CM

## 2021-05-01 LAB — COMPREHENSIVE METABOLIC PANEL
Albumin: 4 (ref 3.5–5.0)
Calcium: 9 (ref 8.7–10.7)

## 2021-05-01 LAB — HEPATIC FUNCTION PANEL
ALT: 19 U/L (ref 10–40)
AST: 22 (ref 14–40)
Alkaline Phosphatase: 137 — AB (ref 25–125)
Bilirubin, Total: 0.6

## 2021-05-01 LAB — CBC AND DIFFERENTIAL
HCT: 38 — AB (ref 41–53)
Hemoglobin: 12 — AB (ref 13.5–17.5)
Neutrophils Absolute: 3.76
Platelets: 174 10*3/uL (ref 150–400)
WBC: 5.7

## 2021-05-01 LAB — BASIC METABOLIC PANEL
BUN: 20 (ref 4–21)
CO2: 26 — AB (ref 13–22)
Chloride: 101 (ref 99–108)
Creatinine: 1.3 (ref 0.6–1.3)
Glucose: 92
Potassium: 4.6 mEq/L (ref 3.5–5.1)
Sodium: 138 (ref 137–147)

## 2021-05-01 LAB — CBC: RBC: 4.33 (ref 3.87–5.11)

## 2021-05-01 MED ORDER — OXYCODONE HCL 5 MG PO TABS: 5.0000 mg | ORAL_TABLET | Freq: Four times a day (QID) | ORAL | 0 refills | Status: DC | PRN

## 2021-05-02 ENCOUNTER — Other Ambulatory Visit: Payer: Self-pay | Admitting: Oncology

## 2021-05-02 ENCOUNTER — Encounter: Payer: Self-pay | Admitting: Oncology

## 2021-05-02 NOTE — Progress Notes (Signed)
?[facility]  ?[address] ?[place],  [place]  [address] ?[phone] ? ?Clinic Day:  05/03/21 ? ?Referring physician: Renaldo Reel, PA ? ?ASSESSMENT & PLAN:  ? ?Assessment & Plan: ? ?Non-small cell lung cancer ?Stage IIIA non-small cell lung cancer with partial response to concurrent chemoradiation.  He completed 1 year of durvalumab in March 2021 and tolerated this without significant difficulty.  CT imaging in April revealed continued improvement.  PET scan from July 2021 revealed a large area of treated tumor/radiation fibrosis involving the right lower lobe with no hypermetabolism to suggest residual or recurrent tumor.  No evidence of metastatic disease was observed.  CT scan from September 2022 revealed unchanged post treatment/post radiation appearance of the right lung, with perihilar fibrosis and consolidation. No evidence of recurrent or metastatic disease.  His most recent scan from May 01, 2021 remains stable. ? ?  ?I reviewed the test results with the patient and his wife and all looks good.  He is clinically doing well and so I can see him back in 3 months with CBC and comprehensive metabolic profile.  He will need to have his port flushed at that time.  If all is well we will wait and rescan him in 6 months. The patient understands the plans discussed today and is in agreement with them.  He knows to contact our office if he develops concerns prior to his next appointment. ? ? ? ?Derwood Kaplan, MD  ?[facility] ?[place] ?[place] [address] ?Dept: [phone] ?Dept Fax: [phone]  ? ? ? ? ?CHIEF COMPLAINT:  ?CC: Stage IIIA non-small cell lung cancer ? ?Current Treatment: Observation ? ? ?HISTORY OF PRESENT ILLNESS:  ?John Maddox is a 81 y.o. male with stage IIIA (T3 N1 M0) non-small cell lung cancer diagnosed in September 2019.  He had presented with severe bradycardia and  left chest discomfort and was found to have a 6.2 cm mass in the right upper lobe with 1 paratracheal node measuring 1-2 cm.  Bronchoscopy was done and the 1st set of cytology and pathology results were negative.  Bronchial brushings and washings revealed rare atypical cells suspicious for malignancy in 1 specimen and malignant cells definitely consistent with non-small cell carcinoma of the lung, favoring adenocarcinoma, in a 2nd specimen.  We recommended concurrent chemoradiation with weekly carboplatin/paclitaxel.  He has many comorbidities including moderate emphysema, a distal descending thoracic aortic aneurysm measuring 4.4 cm, hypertension, congestive heart failure, diabetes mellitus, chronic kidney disease, multiple aortic aneurysms, peripheral artery disease, and history of prostate cancer 11 years ago.  His Cardiolite scan revealed mild global hypokinesis with an ejection fraction of 37%.  He also has pre-existing peripheral neuropathy.  MRI of the head did not reveal any evidence of intracranial metastasis.  He received 7 cycles of chemotherapy, which was completed in December 2019.  Unfortunately, the patient became quite ill and septic later in December 2019.  He was transferred from [facility] to [facility].  He was found to have septic arthritis of his knee and was placed on IV antibiotics at home with daptomycin and ceftriaxone as well as rifampin.  The hemoglobin was 8.0 when he was discharged and continued to drop to 7.3 and then 7.1.  We were eventually able to transfuse him as an inpatient.   When the patient went to see Dr. Linus Salmons of Infectious Disease in February 2020,  he collapsed prior to getting his labs drawn and had cardiorespiratory arrest requiring resuscitation.  He was transported to the emergency room and admitted.  He was found to have acute pulmonary embolism of small to moderate size, but no significant right heart strain.  He had deep venous thrombosis of  the right femoral vein, right popliteal vein and right posterior tibial vein.  He was placed on heparin IV and transitioned to apixaban 5 mg twice daily, which he continues.  He was also found to have aspiration pneumonia during that hospitalization. A CT chest revealed the spiculated mass in the posterior right upper lobe was slightly smaller measuring 3.8 cm in maximum diameter, decreased from 4.0 cm.  There was stable right hilar adenopathy measuring 1.4 cm.  There was a small right pleural effusion. ?  ?He started maintenance durvalumab in March 2020 and tolerated it well.  The patient had a repeat echocardiogram, which showed improvement in his ejection fraction.  CT chest in early June revealed decrease in the size of the right upper lobe mass to 2.4 cm, with decrease in the pleural effusion.  There was a stable thoracic aortic aneurysm measuring 4.7 cm.  When he was seen in June, he reported intermittent dizziness, but MRI brain did not reveal intracranial metastasis or other acute abnormality.  Repeat CT chest in September 2020 revealed improvement in the size of the right upper lobe pulmonary nodule from 2.4 cm to 1.9 cm, with a possible right lower lobe pneumonia.  CT chest in January 2021 revealed slight interval increase in dense, post treatment fibrotic consolidation and volume loss of the perihilar right upper lobe, which is consistent with maturation of radiation fibrosis.  The previously seen clustered nodularity of the right lower lobe was essentially resolved, consistent with resolved atypical infection or inflammation.  Atherosclerotic, tortuous and aneurysmal thoracic aorta, the distal arch measuring approximately 5.0 cm and the tortuous descending thoracic aorta focally aneurysmal and measuring approximately 5.1 cm in greatest caliber orthogonal to flow was re-demonstrated.  I recommended [employer] testing of the peripheral blood, and these results show no reportable alterations with  companion diagnostic claims.  Other biomarkers with potential clinical significance, included DNMT3A and TET2.  He completed a total of 1 year of durvalumab therapy on March 10th.  CT chest in April revealed post treatment changes about the right hilum measuring 6.9 x 2.5 cm, previously 7.4 x 3.4 cm.  There were nodular changes in the left lung base developing along the margin of the aorta measuring 1.7 x 1.1 cm, more conspicuous than on the previous exam.  Stable appearance of the thoracic aorta with tortuosity and dilation.  The upper abdominal aortic aneurysm with mural thrombus is similar in appearance measuring 4.6 cm.  PET imaging from July 2021 revealed a large are of treated tumor/radiation fibrosis involving the right lower lobe.  There was no hypermetabolism to suggest residual or recurrent tumor, no findings for mediastinal/hilar adenopathy or pulmonary metastatic disease.  There was no evidence of abdominal/pelvic metastatic disease or osseous metastatic disease.  Stable, severe vascular disease was seen.  The upper abdominal aorta measures 4.4 cm.  CT from January revealed stable right infrahilar soft tissue at the site of previous treatment.  Decreased size of nodular focus at the left lung base now measuring 13 x 10 mm as compared to 17 x 10 mm. Likely nodular scarring. Tortuous and aneurysmal appearance of the thoracoabdominal aorta with similar appearance.  CT chest, abdomen and pelvis from September  of 2022 revealed unchanged post treatment/post radiation appearance of the right lung, with perihilar fibrosis and consolidation with no evidence of recurrent or metastatic disease. Redemonstrated aneurysmal aortic arch and descending thoracic aorta, maximum caliber of the distal arch approximately 4.6 x 4.2 cm and of the distal descending thoracic aorta approximately 5.2 x 5.2 cm. ? ?INTERVAL HISTORY:  ?Shamar is here today for repeat clinical assessment and continues to do fairly well.  He had a  repeat CT scan of chest, abdomen and pelvis, and this remains stable.  He has no evidence of metastatic disease but does have chronic changes of the right perihilar area from radiotherapy.  He still has an Oceanographer

## 2021-05-03 ENCOUNTER — Other Ambulatory Visit: Payer: Self-pay

## 2021-05-03 ENCOUNTER — Encounter: Payer: Self-pay | Admitting: Oncology

## 2021-05-03 ENCOUNTER — Inpatient Hospital Stay: Payer: Medicare Other | Attending: Oncology | Admitting: Oncology

## 2021-05-03 ENCOUNTER — Other Ambulatory Visit: Payer: Self-pay | Admitting: Oncology

## 2021-05-03 VITALS — BP 123/71 | HR 70 | Temp 97.6°F | Resp 18 | Ht 70.0 in | Wt 233.6 lb

## 2021-05-03 DIAGNOSIS — C3491 Malignant neoplasm of unspecified part of right bronchus or lung: Secondary | ICD-10-CM

## 2021-05-13 ENCOUNTER — Encounter: Payer: Self-pay | Admitting: Hematology and Oncology

## 2021-05-15 LAB — CEA: CEA: 2.2

## 2021-05-22 DIAGNOSIS — Z20822 Contact with and (suspected) exposure to covid-19: Secondary | ICD-10-CM | POA: Diagnosis not present

## 2021-05-23 ENCOUNTER — Other Ambulatory Visit: Payer: Self-pay | Admitting: Oncology

## 2021-05-31 ENCOUNTER — Other Ambulatory Visit: Payer: Self-pay

## 2021-05-31 DIAGNOSIS — G8929 Other chronic pain: Secondary | ICD-10-CM

## 2021-05-31 MED ORDER — OXYCODONE HCL 5 MG PO TABS: 5.0000 mg | ORAL_TABLET | Freq: Four times a day (QID) | ORAL | 0 refills | Status: DC | PRN

## 2021-06-20 DIAGNOSIS — G620 Drug-induced polyneuropathy: Secondary | ICD-10-CM | POA: Diagnosis not present

## 2021-06-20 DIAGNOSIS — Z86711 Personal history of pulmonary embolism: Secondary | ICD-10-CM | POA: Diagnosis not present

## 2021-06-20 DIAGNOSIS — K219 Gastro-esophageal reflux disease without esophagitis: Secondary | ICD-10-CM | POA: Diagnosis not present

## 2021-06-20 DIAGNOSIS — I110 Hypertensive heart disease with heart failure: Secondary | ICD-10-CM | POA: Diagnosis not present

## 2021-06-20 DIAGNOSIS — J449 Chronic obstructive pulmonary disease, unspecified: Secondary | ICD-10-CM | POA: Diagnosis not present

## 2021-06-20 DIAGNOSIS — E785 Hyperlipidemia, unspecified: Secondary | ICD-10-CM | POA: Diagnosis not present

## 2021-06-20 DIAGNOSIS — M109 Gout, unspecified: Secondary | ICD-10-CM | POA: Diagnosis not present

## 2021-06-20 DIAGNOSIS — Z79899 Other long term (current) drug therapy: Secondary | ICD-10-CM | POA: Diagnosis not present

## 2021-06-20 DIAGNOSIS — T451X5A Adverse effect of antineoplastic and immunosuppressive drugs, initial encounter: Secondary | ICD-10-CM | POA: Diagnosis not present

## 2021-06-20 DIAGNOSIS — E1169 Type 2 diabetes mellitus with other specified complication: Secondary | ICD-10-CM | POA: Diagnosis not present

## 2021-06-20 DIAGNOSIS — H6122 Impacted cerumen, left ear: Secondary | ICD-10-CM | POA: Diagnosis not present

## 2021-06-20 DIAGNOSIS — Z85118 Personal history of other malignant neoplasm of bronchus and lung: Secondary | ICD-10-CM | POA: Diagnosis not present

## 2021-07-03 ENCOUNTER — Telehealth: Payer: Self-pay

## 2021-07-03 ENCOUNTER — Other Ambulatory Visit: Payer: Self-pay | Admitting: Hematology and Oncology

## 2021-07-03 DIAGNOSIS — G8929 Other chronic pain: Secondary | ICD-10-CM

## 2021-07-03 MED ORDER — OXYCODONE HCL 5 MG PO TABS: 5.0000 mg | ORAL_TABLET | Freq: Four times a day (QID) | ORAL | 0 refills | Status: DC | PRN

## 2021-07-03 NOTE — Telephone Encounter (Signed)
Patient notified [facility] meds [facility].

## 2021-07-10 DIAGNOSIS — R0602 Shortness of breath: Secondary | ICD-10-CM | POA: Diagnosis not present

## 2021-07-10 DIAGNOSIS — R0609 Other forms of dyspnea: Secondary | ICD-10-CM | POA: Diagnosis not present

## 2021-07-10 DIAGNOSIS — I10 Essential (primary) hypertension: Secondary | ICD-10-CM | POA: Diagnosis not present

## 2021-08-02 ENCOUNTER — Encounter: Payer: Self-pay | Admitting: Oncology

## 2021-08-02 ENCOUNTER — Other Ambulatory Visit: Payer: Self-pay | Admitting: Oncology

## 2021-08-02 ENCOUNTER — Inpatient Hospital Stay: Payer: Medicare Other | Attending: Oncology

## 2021-08-02 ENCOUNTER — Inpatient Hospital Stay (INDEPENDENT_AMBULATORY_CARE_PROVIDER_SITE_OTHER): Payer: Medicare Other | Admitting: Oncology

## 2021-08-02 VITALS — BP 144/69 | HR 88 | Temp 98.1°F | Resp 20 | Ht 70.0 in | Wt 229.5 lb

## 2021-08-02 DIAGNOSIS — G8929 Other chronic pain: Secondary | ICD-10-CM

## 2021-08-02 DIAGNOSIS — C3491 Malignant neoplasm of unspecified part of right bronchus or lung: Secondary | ICD-10-CM

## 2021-08-02 DIAGNOSIS — D649 Anemia, unspecified: Secondary | ICD-10-CM | POA: Diagnosis not present

## 2021-08-02 LAB — CBC AND DIFFERENTIAL
HCT: 35 — AB (ref 41–53)
Hemoglobin: 11.3 — AB (ref 13.5–17.5)
Neutrophils Absolute: 3.84
Platelets: 156 10*3/uL (ref 150–400)
WBC: 5.9

## 2021-08-02 LAB — COMPREHENSIVE METABOLIC PANEL
Albumin: 3.7 (ref 3.5–5.0)
Calcium: 8.8 (ref 8.7–10.7)

## 2021-08-02 LAB — BASIC METABOLIC PANEL
BUN: 21 (ref 4–21)
CO2: 27 — AB (ref 13–22)
Chloride: 107 (ref 99–108)
Creatinine: 1.3 (ref 0.6–1.3)
Glucose: 91
Potassium: 4 mEq/L (ref 3.5–5.1)
Sodium: 141 (ref 137–147)

## 2021-08-02 LAB — HEPATIC FUNCTION PANEL
ALT: 18 U/L (ref 10–40)
AST: 21 (ref 14–40)
Alkaline Phosphatase: 143 — AB (ref 25–125)
Bilirubin, Total: 0.5

## 2021-08-02 LAB — CBC: RBC: 4 (ref 3.87–5.11)

## 2021-08-02 MED ORDER — HEPARIN SOD (PORK) LOCK FLUSH 100 UNIT/ML IV SOLN: 500.0000 [IU] | Freq: Once | INTRAVENOUS | Status: DC | PRN

## 2021-08-02 MED ORDER — OXYCODONE HCL 5 MG PO TABS: 5.0000 mg | ORAL_TABLET | Freq: Four times a day (QID) | ORAL | 0 refills | Status: DC | PRN

## 2021-08-02 MED ORDER — SODIUM CHLORIDE 0.9% FLUSH: 10.0000 mL | INTRAVENOUS | Status: DC | PRN

## 2021-08-29 ENCOUNTER — Other Ambulatory Visit: Payer: Self-pay | Admitting: Cardiology

## 2021-08-30 ENCOUNTER — Encounter: Payer: Self-pay | Admitting: Hematology and Oncology

## 2021-08-30 NOTE — Progress Notes (Signed)
[facility]  [address] [facility] [place],  [place]  [address] [phone]  Clinic Day:  05/03/21  Referring physician: Nicoletta Dress, MD  ASSESSMENT & PLAN:   Assessment & Plan:  Non-small cell lung cancer Stage IIIA non-small cell lung cancer with partial response to concurrent chemoradiation.  He completed 1 year of durvalumab in March 2021 and tolerated this without significant difficulty.  CT imaging in April revealed continued improvement.  PET scan from July 2021 revealed a large area of treated tumor/radiation fibrosis involving the right lower lobe with no hypermetabolism to suggest residual or recurrent tumor.  No evidence of metastatic disease was observed.  CT scan from September 2022 revealed unchanged post treatment/post radiation appearance of the right lung, with perihilar fibrosis and consolidation. No evidence of recurrent or metastatic disease.  His most recent scan from May 01, 2021 remains stable.  Anemia This is largely anemia of chronic disease but is lower than it has been.  We will continue to monitor it.    I reviewed the test results with the patient and his wife and all looks good.  He is clinically doing well and so I can see him back in 3 months with CBC, CEA, comprehensive metabolic profile and CT of the chest.  He will need to have his port flushed at that time.  I will refill his oxycodone.  His port was flushed today.  The patient understands the plans discussed today and is in agreement with them.  He knows to contact our office if he develops concerns prior to his next appointment.    Derwood Kaplan, MD  [facility] [address] [place] [address] Dept: [phone] Dept Fax: [phone]      CHIEF COMPLAINT:  CC: Stage IIIA non-small cell lung cancer  Current Treatment: Observation   HISTORY OF PRESENT ILLNESS:  John Maddox  is a 81 y.o. male with stage IIIA (T3 N1 M0) non-small cell lung cancer diagnosed in September 2019.  He had presented with severe bradycardia and left chest discomfort and was found to have a 6.2 cm mass in the right upper lobe with 1 paratracheal node measuring 1-2 cm.  Bronchoscopy was done and the 1st set of cytology and pathology results were negative.  Bronchial brushings and washings revealed rare atypical cells suspicious for malignancy in 1 specimen and malignant cells definitely consistent with non-small cell carcinoma of the lung, favoring adenocarcinoma, in a 2nd specimen.  We recommended concurrent chemoradiation with weekly carboplatin/paclitaxel.  He has many comorbidities including moderate emphysema, a distal descending thoracic aortic aneurysm measuring 4.4 cm, hypertension, congestive heart failure, diabetes mellitus, chronic kidney disease, multiple aortic aneurysms, peripheral artery disease, and history of prostate cancer 11 years ago.  His Cardiolite scan revealed mild global hypokinesis with an ejection fraction of 37%.  He also has pre-existing peripheral neuropathy.  MRI of the head did not reveal any evidence of intracranial metastasis.  He received 7 cycles of chemotherapy, which was completed in December 2019.  Unfortunately, the patient became quite ill and septic later in December 2019.  He was transferred from [facility] to [facility].  He was found to have septic arthritis of his knee and was placed on IV antibiotics at home with daptomycin and ceftriaxone as well as rifampin.  The hemoglobin was 8.0 when he was discharged and continued to drop to 7.3 and then 7.1.  We were eventually able to transfuse him as an inpatient.   When the patient went to see Dr. Linus Salmons of Infectious Disease in February 2020, he collapsed prior to getting his labs drawn and had cardiorespiratory arrest requiring resuscitation.  He was transported to the emergency room and admitted.   He was found to have acute pulmonary embolism of small to moderate size, but no significant right heart strain.  He had deep venous thrombosis of the right femoral vein, right popliteal vein and right posterior tibial vein.  He was placed on heparin IV and transitioned to apixaban 5 mg twice daily, which he continues.  He was also found to have aspiration pneumonia during that hospitalization. A CT chest revealed the spiculated mass in the posterior right upper lobe was slightly smaller measuring 3.8 cm in maximum diameter, decreased from 4.0 cm.  There was stable right hilar adenopathy measuring 1.4 cm.  There was a small right pleural effusion.   He started maintenance durvalumab in March 2020 and tolerated it well.  The patient had a repeat echocardiogram, which showed improvement in his ejection fraction.  CT chest in early June revealed decrease in the size of the right upper lobe mass to 2.4 cm, with decrease in the pleural effusion.  There was a stable thoracic aortic aneurysm measuring 4.7 cm.  When he was seen in June, he reported intermittent dizziness, but MRI brain did not reveal intracranial metastasis or other acute abnormality.  Repeat CT chest in September 2020 revealed improvement in the size of the right upper lobe pulmonary nodule from 2.4 cm to 1.9 cm, with a possible right lower lobe pneumonia.  CT chest in January 2021 revealed slight interval increase in dense, post treatment fibrotic consolidation and volume loss of the perihilar right upper lobe, which is consistent with maturation of radiation fibrosis.  The previously seen clustered nodularity of the right lower lobe was essentially resolved, consistent with resolved atypical infection or inflammation.  Atherosclerotic, tortuous and aneurysmal thoracic aorta, the distal arch measuring approximately 5.0 cm and the tortuous descending thoracic aorta focally aneurysmal and measuring approximately 5.1 cm in greatest caliber orthogonal to  flow was re-demonstrated.  I recommended [employer] testing of the peripheral blood, and these results show no reportable alterations with companion diagnostic claims.  Other biomarkers with potential clinical significance, included DNMT3A and TET2.  He completed a total of 1 year of durvalumab therapy on March 10th.  CT chest in April revealed post treatment changes about the right hilum measuring 6.9 x 2.5 cm, previously 7.4 x 3.4 cm.  There were nodular changes in the left lung base developing along the margin of the aorta measuring 1.7 x 1.1 cm, more conspicuous than on the previous exam.  Stable appearance of the thoracic aorta with tortuosity and dilation.  The upper abdominal aortic aneurysm with mural thrombus is similar in appearance measuring 4.6 cm.  PET imaging from July 2021 revealed a large are of treated tumor/radiation fibrosis involving the right lower lobe.  There was no hypermetabolism to suggest residual or recurrent tumor, no findings for mediastinal/hilar adenopathy or pulmonary metastatic disease.  There was no evidence of abdominal/pelvic metastatic disease or osseous metastatic disease.  Stable, severe vascular disease was seen.  The upper abdominal aorta measures 4.4 cm.  CT from January revealed stable right infrahilar soft tissue at the site of previous treatment.  Decreased size of nodular focus at the left lung base now measuring 13 x 10 mm as compared to  17 x 10 mm. Likely nodular scarring. Tortuous and aneurysmal appearance of the thoracoabdominal aorta with similar appearance.  CT chest, abdomen and pelvis from September of 2022 revealed unchanged post treatment/post radiation appearance of the right lung, with perihilar fibrosis and consolidation with no evidence of recurrent or metastatic disease. Redemonstrated aneurysmal aortic arch and descending thoracic aorta, maximum caliber of the distal arch approximately 4.6 x 4.2 cm and of the distal descending thoracic aorta  approximately 5.2 x 5.2 cm.  INTERVAL HISTORY:  John Maddox is here today for repeat clinical assessment and complains of pain all over, but this is chronic.  His CT scan of chest, abdomen and pelvis from April remains stable.  He has no evidence of metastatic disease but does have chronic changes of the right perihilar area from radiotherapy.  He still has an aneurysm of the abdominal aorta which measures 4.8 cm, stable.  He denies cough, chest pain or progressive dyspnea.  He only intermittently uses portable oxygen.  He has difficulty ambulating due to dyspnea with exertion as well as pain from degenerative disease.  He continues to use a wheelchair.  He is back on Celebrex and this is helping.  He continues to have generalized pain from severe arthritis and degenerative disc disease.  He continues oxycodone 5 mg every 6 hours with fair control of his pain.  His labs look fairly good but his hemoglobin has decreased from 12.0 to 11.3.  Comprehensive metabolic profile reveals a stable creatinine of 1.3 with the rest normal.  His CEA was stable and normal at 2.2. He denies fevers or chills.  His appetite is good.  His weight is down 4 pounds since his last visit.  REVIEW OF SYSTEMS:  Review of Systems  Constitutional:  Positive for fatigue. Negative for appetite change, chills, fever and unexpected weight change.  HENT:   Negative for lump/mass, mouth sores and sore throat.   Respiratory:  Positive for shortness of breath (With exertion). Negative for cough.   Cardiovascular:  Negative for chest pain and leg swelling.  Gastrointestinal:  Negative for abdominal pain, constipation, diarrhea, nausea and vomiting.  Genitourinary:  Negative for difficulty urinating, dysuria, frequency and hematuria.   Musculoskeletal:  Positive for arthralgias (Chronic, stable due to degenerative disease) and gait problem (Due to degenerative disease). Negative for back pain and myalgias.  Skin:  Negative for itching, rash and  wound.  Neurological:  Positive for gait problem (Due to degenerative disease). Negative for dizziness, extremity weakness, headaches, light-headedness and numbness.  Hematological:  Negative for adenopathy.  Psychiatric/Behavioral:  Negative for depression and sleep disturbance. The patient is not nervous/anxious.     VITALS:  Blood pressure (!) 144/69, pulse 88, temperature 98.1 F (36.7 C), temperature source Oral, resp. rate 20, height _0  (1.778 m), weight 229 lb 8 oz (104.1 kg), SpO2 96 %.  Wt Readings from Last 3 Encounters:  08/02/21 229 lb 8 oz (104.1 kg)  05/03/21 233 lb 9.6 oz (106 kg)  03/21/21 227 lb (103 kg)    Body mass index is 32.93 kg/m.  Performance status (ECOG): 2 - Symptomatic, <50% confined to bed  PHYSICAL EXAM:  Physical Exam Vitals and nursing note reviewed.  Constitutional:      General: He is not in acute distress.    Appearance: Normal appearance. He is normal weight.  HENT:     Head: Normocephalic and atraumatic.     Mouth/Throat:     Mouth: Mucous membranes are moist.  Pharynx: Oropharynx is clear. No oropharyngeal exudate or posterior oropharyngeal erythema.  Eyes:     General: No scleral icterus.    Extraocular Movements: Extraocular movements intact.     Conjunctiva/sclera: Conjunctivae normal.     Pupils: Pupils are equal, round, and reactive to light.  Cardiovascular:     Rate and Rhythm: Normal rate and regular rhythm.     Heart sounds: Normal heart sounds. No murmur heard.    No friction rub. No gallop.  Pulmonary:     Effort: Pulmonary effort is normal.     Breath sounds: Normal breath sounds. No wheezing, rhonchi or rales.  Abdominal:     General: Bowel sounds are normal. There is no distension.     Palpations: Abdomen is soft. There is no hepatomegaly, splenomegaly or mass.     Tenderness: There is no abdominal tenderness.  Musculoskeletal:        General: Normal range of motion.     Cervical back: Normal range of motion  and neck supple. No tenderness.     Right lower leg: Edema (1+) present.     Left lower leg: Edema (1+) present.  Lymphadenopathy:     Cervical: No cervical adenopathy.     Upper Body:     Right upper body: No supraclavicular or axillary adenopathy.     Left upper body: No supraclavicular or axillary adenopathy.     Lower Body: No right inguinal adenopathy. No left inguinal adenopathy.  Skin:    General: Skin is warm and dry.     Coloration: Skin is not jaundiced.     Findings: No rash.  Neurological:     Mental Status: He is alert and oriented to person, place, and time.     Cranial Nerves: No cranial nerve deficit.  Psychiatric:        Mood and Affect: Mood normal.        Behavior: Behavior normal.        Thought Content: Thought content normal.    LABS:      Latest Ref Rng & Units 08/02/2021   12:00 AM 05/01/2021   12:00 AM 02/01/2021   12:00 AM  CBC  WBC  5.9     5.7     8.6   Hemoglobin 13.5 - 17.5 11.3     12.0     12.9   Hematocrit 41 - 53 35     38     40   Platelets 150 - 400 K/uL 156     174     185      This result is from an external source.       Latest Ref Rng & Units 08/02/2021   12:00 AM 05/01/2021   12:00 AM 02/01/2021   12:00 AM  CMP  BUN 4 - _0 Creatinine 0.6 - 1.3 1.3     1.3     1.5   Sodium 137 - 147 141     138     142   Potassium 3.5 - 5.1 mEq/L 4.0     4.6     4.2   Chloride 99 - 108 107     101     109   CO2 13 - _1 Calcium 8.7 - 10.7 8.8     9.0     9.0  Alkaline Phos 25 - 125 143     137     159   AST 14 - 40 _0 ALT 10 - 40 U/L _1 This result is from an external source.      Lab Results  Component Value Date   CEA1 2.1 02/01/2021   CEA 2.2 05/01/2021   /  CEA  Date Value Ref Range Status  05/01/2021 2.2  Final  02/01/2021 2.1 0.0 - 4.7 ng/mL Final    Comment:    (NOTE)                             Nonsmokers          <3.9                              Smokers             <5.6 [employer] Electrochemiluminescence Immunoassay (ECLIA) Values obtained with different assay methods or kits cannot be used interchangeably.  Results cannot be interpreted as absolute evidence of the presence or absence of malignant disease. Performed At: [facility] [place], [place] [id number] Rush Farmer MD [id number]    STUDIES:    EXAM: 05/01/21 CT CHEST, ABDOMEN, AND PELVIS WITH CONTRAST  TECHNIQUE:  Multidetector CT imaging of the chest, abdomen and pelvis was  performed following the standard protocol during bolus  administration of intravenous contrast.  RADIATION DOSE REDUCTION: This exam was performed according to the  departmental dose-optimization program which includes automated  exposure control, adjustment of the mA and/or kV according to  patient size and/or use of iterative reconstruction technique.  CONTRAST: 80 cc Isovue 370  COMPARISON: CTs 10/05/2020 and 02/10/2020. PET-CT 08/11/2019.   FINDINGS:  CT CHEST FINDINGS  Cardiovascular: No acute vascular findings are identified. Left IJ  Port-A-Cath tip extends to the superior cavoatrial junction. There  is atherosclerosis of the aorta, great vessels and coronary  arteries. The thoracic aorta is tortuous and mildly dilated,  unchanged from the previous study. The descending thoracic aorta  measures up to 5.7 cm on coronal image 82/601, unchanged from  previous study (remeasured). The heart size is normal. There is no  pericardial effusion.  Mediastinum/Nodes: There are no enlarged mediastinal, hilar or  axillary lymph nodes. The thyroid gland, trachea and esophagus  demonstrate no significant findings.  Lungs/Pleura: No pleural effusion or pneumothorax. Moderate  centrilobular emphysema. Stable bandlike scarring in the right  perihilar region with architectural distortion and volume loss  attributed to prior radiation therapy. No evidence of local   recurrence or suspicious pulmonary nodule. Stable mild scarring in  the lingula and medial left lower lobe.  Musculoskeletal/Chest wall: No chest wall mass or suspicious osseous  findings.   CT ABDOMEN AND PELVIS FINDINGS  Hepatobiliary: The liver is normal in density without suspicious  focal abnormality. Stable low-density lesions anteriorly in the  liver, likely focal fat. No evidence of biliary dilatation status  post cholecystectomy.  Pancreas: Unremarkable. No pancreatic ductal dilatation or  surrounding inflammatory changes.  Spleen: Normal in size without focal abnormality.  Adrenals/Urinary Tract: Both adrenal glands appear normal. Small  nonobstructing right renal calculi. No evidence of ureteral calculus  or hydronephrosis. Both kidneys demonstrate  mild cortical thinning  without focal abnormality. The bladder appears unremarkable.  Stomach/Bowel: Enteric contrast was administered and has passed into  the mid colon. The stomach appears unremarkable for its degree of  distension. No evidence of bowel wall thickening, distention or  surrounding inflammatory change. Multiple prominent duodenal  diverticula are again noted. There is diverticulosis of the  descending and sigmoid colon. The appendix appears normal.  Vascular/Lymphatic: There are no enlarged abdominal or pelvic lymph  nodes. Stable postsurgical changes status post aorto bi-iliac stent  graft repair. Just proximal to the stent graft, there is stable  aneurysmal dilatation of the aorta which measures 4.8 x 4.3 cm on  image 62/2 (previously 4.8 x 4.4 cm). No evidence of large vessel  occlusion or retroperitoneal hematoma.  Reproductive: Prostate fiducial markers are noted without change in  appearance of the gland.  Other: Stable small umbilical hernia containing only fat. Stable  mildly prominent fat in both inguinal canals. No ascites or  peritoneal nodularity.  Musculoskeletal: No acute or significant osseous  findings.  Degenerative and postsurgical changes throughout the lumbar spine.  Convex right scoliosis. Moderate bilateral hip arthropathy.   IMPRESSION:  1. Stable treatment changes in the right lung without evidence of  local recurrence or metastatic disease.  2. No evidence of metastatic disease or acute findings in the  abdomen or pelvis.  3. Status post aorto iliac stent grafting with stable aneurysmal  dilatation of the descending thoracic and proximal abdominal aorta.  Follow-up as clinically warranted.  4. Coronary and aortic atherosclerosis (ICD10-I70.0). Emphysema  (ICD10-J43.9).   Electronically Signed  By: Richardean Sale M.D.  On: 05/02/2021 12:55   HISTORY:   Past Medical History:  Diagnosis Date   AAA (abdominal aortic aneurysm) without rupture ([place]) 08/03/2015   AKI (acute kidney injury) ([place]) 01/08/2018   Last Assessment & Plan:  Improved Nephrology consulted Likely 2/2 ATN and CIN in the setting of contrast at the outside hospital on 12/21, NSAIDs at the outside hospital, intra-operative hypotension, and entresto use. Avoid nephrotoxic medications(entresto/diuretic) Renally dose medications Strict I&O Monitor creatinine(1.68 today)     Anemia 01/17/2018   Last Assessment & Plan:  Due to chemotherapy and anemia of chronic disease Iron panel shows anemia of chronic disease Follow up with PCP and oncologist   Arrhythmia    Asthma 09/23/2017   Atherosclerosis of native artery of both lower extremities ([place]) 08/03/2015   Bilateral carotid artery stenosis 08/03/2015   Cardiomyopathy ([place]) 11/18/2017   Chronic anticoagulation 06/05/2018   Chronic combined systolic and diastolic heart failure ([place]) 10/03/2017   Congestive heart failure ([place]) 09/23/2017   COPD (chronic obstructive pulmonary disease) ([place]) 09/23/2017   Diabetes mellitus ([place]) 09/23/2017   Diarrhea 01/14/2018   Last Assessment & Plan:  C diff - negative  GIP - negative Resolved   Diastolic dysfunction 03/20/1441    Duodenal nodule 11/10/2014   Esophageal reflux 09/23/2017   Essential hypertension 11/10/2014   Last Assessment & Plan:  Controlled off of medication Follow up with PCP as an outpatient   Fatigue 09/23/2017   Gout 09/23/2017   Hematuria 01/14/2018   Last Assessment & Plan:  Need UA as outpatient.   History of prostate cancer 09/23/2017   Hyperlipidemia 09/23/2017   Hypertension 09/23/2017   Hypertensive heart disease 09/23/2017   Hypertensive heart disease with heart failure ([place]) 06/06/2018   Hypokalemia 01/14/2018   Last Assessment & Plan:  Replace and follow up with PCP   Hypomagnesemia 01/14/2018   Last  Assessment & Plan:  Replace and monitor and follow up with PCP   Hypophosphatemia 01/15/2018   Last Assessment & Plan:  Resolved   Infected prosthetic knee joint, initial encounter ([place]) 03/06/2018   Insulin dependent type 2 diabetes mellitus ([place]) 11/10/2014   Last Assessment & Plan:  Glucose 109-140 Continue ISS Monitor glucose   Left ventricular dysfunction 10/03/2017   Lung cancer ([place])    Lung mass 10/04/2017   Non-small cell lung cancer ([place]) 10/03/2017   Pulmonary emboli ([place]) 03/06/2018   Pulmonary embolism ([facility])    PVC's (premature ventricular contractions) 09/23/2017   PVD (peripheral vascular disease) ([place]) 11/10/2014   Septic arthritis of knee, left ([place]) 01/04/2018   Added automatically from request for surgery [id number]  Last Assessment & Plan:  Prosthetic knee infection S/p wash out Culture negative  Continue Dapto + Ceftriaxone + Rifampin for 4 weeks(currently day 7) ID was notified today prior to discharge He will need CBC, CMP, ESR, CRP weekly faxed to [phone]([facility] with ID) ID outpatient appointment is on 02/17/2017  Antibiotics administered by port by ho   Thrombocytopenia ([place]) 01/15/2018   Last Assessment & Plan:  Decreased to 78.  Due to chemotherapy Peripheral smear negative for schistocytes LDH 211(within normal limits), haptoglobin 329(elevated), Retic 1.7 B12=714, Folate=20,  copper pending    Past Surgical History:  Procedure Laterality Date   ABDOMINAL AORTIC ANEURYSM REPAIR     AORTA - ILIAC ARTERY BYPASS GRAFT     BACK SURGERY     CATARACT EXTRACTION, BILATERAL     CHOLECYSTECTOMY     HEMICOLECTOMY     HERNIA REPAIR     KNEE SURGERY Left    replaced all the hardwear   PROSTATE SURGERY     REPLACEMENT TOTAL KNEE     STENT PLACEMENT VASCULAR ([place] HX)     TONSILLECTOMY      Family History  Problem Relation Age of Onset   Arthritis Mother    Lung cancer Mother    Hypertension Mother    Arthritis Father    Heart attack Father    Prostate cancer Father    Hypertension Father    Hyperlipidemia Father    Heart disease Father    Alcohol abuse Father    Arthritis Brother    Lung cancer Brother     Social History:  reports that he quit smoking about 9 years ago. His smoking use included cigarettes. He has a 100.00 pack-year smoking history. He has never used smokeless tobacco. He reports that he does not currently use alcohol. He reports that he does not currently use drugs.The patient is accompanied by his wife today.  Allergies:  Allergies  Allergen Reactions   Lantus [Insulin Glargine]     Stomach pain   Quinolones Other (See Comments)    History of aortic aneurysm status post repair.  Use with caution    Current Medications: Current Outpatient Medications  Medication Sig Dispense Refill   allopurinol (ZYLOPRIM) 300 MG tablet Take 300 mg by mouth daily.     apixaban (ELIQUIS) 5 MG TABS tablet Take 1 tablet (5 mg total) by mouth 2 (two) times daily. 180 tablet 3   carvedilol (COREG) 3.125 MG tablet TAKE 1 TABLET TWICE A DAY 180 tablet 3   ENTRESTO 97-103 MG TAKE 1 TABLET TWICE A DAY 180 tablet 3   fluticasone-salmeterol (ADVAIR HFA) 115-21 MCG/ACT inhaler Inhale 2 puffs into the lungs 2 (two) times daily.      gabapentin (NEURONTIN) 100 MG  capsule Take 100 mg by mouth 2 (two) times daily.     metFORMIN (GLUCOPHAGE) 500 MG tablet Take  500 mg by mouth daily with breakfast. 2 tablets daily     omeprazole (PRILOSEC) 20 MG capsule Take 20 mg by mouth daily.     oxyCODONE (OXY IR/ROXICODONE) 5 MG immediate release tablet Take 1 tablet (5 mg total) by mouth every 6 (six) hours as needed for severe pain. 120 tablet 0   PROAIR HFA 108 (90 Base) MCG/ACT inhaler Inhale 1 puff into the lungs every 6 (six) hours as needed.      simvastatin (ZOCOR) 40 MG tablet Take 1 tablet (40 mg total) by mouth daily. 90 tablet 3   SPIRIVA RESPIMAT 2.5 MCG/ACT AERS SMARTSIG:2 Puff(s) Via Inhaler Daily     tamsulosin (FLOMAX) 0.4 MG CAPS capsule Take 0.4 mg by mouth daily.     torsemide (DEMADEX) 20 MG tablet TAKE 1 TABLET DAILY, TAKE 1 EXTRA TABLET AT NOON ON MONDAY, WEDNESDAY, AND FRIDAY 130 tablet 3   Current Facility-Administered Medications  Medication Dose Route Frequency Provider Last Rate Last Admin   heparin lock flush 100 unit/mL  500 Units Intracatheter Once PRN Mosher, Vida Roller A, PA-C       sodium chloride flush (NS) 0.9 % injection 10 mL  10 mL Intracatheter PRN Mosher, Thalia Bloodgood, PA-C

## 2021-09-04 ENCOUNTER — Other Ambulatory Visit: Payer: Self-pay

## 2021-09-04 DIAGNOSIS — G8929 Other chronic pain: Secondary | ICD-10-CM

## 2021-09-04 MED ORDER — OXYCODONE HCL 5 MG PO TABS: 5.0000 mg | ORAL_TABLET | Freq: Four times a day (QID) | ORAL | 0 refills | Status: DC | PRN

## 2021-09-05 ENCOUNTER — Other Ambulatory Visit: Payer: Self-pay | Admitting: Cardiology

## 2021-09-19 ENCOUNTER — Other Ambulatory Visit: Payer: Self-pay

## 2021-09-19 DIAGNOSIS — G8929 Other chronic pain: Secondary | ICD-10-CM

## 2021-09-20 ENCOUNTER — Other Ambulatory Visit: Payer: Self-pay | Admitting: Oncology

## 2021-09-20 ENCOUNTER — Encounter: Payer: Self-pay | Admitting: Hematology and Oncology

## 2021-09-20 ENCOUNTER — Telehealth: Payer: Self-pay

## 2021-09-20 DIAGNOSIS — C3491 Malignant neoplasm of unspecified part of right bronchus or lung: Secondary | ICD-10-CM

## 2021-09-20 MED ORDER — OXYCODONE HCL 10 MG PO TABS: 5.0000 mg | ORAL_TABLET | ORAL | 0 refills | Status: DC | PRN

## 2021-09-20 MED ORDER — OXYCODONE HCL 5 MG PO TABS: 5.0000 mg | ORAL_TABLET | Freq: Four times a day (QID) | ORAL | 0 refills | Status: DC | PRN

## 2021-09-20 NOTE — Telephone Encounter (Signed)
I sent message to Dr Hinton Rao after I called the pharmacy to confirm they do not have oxycodone 5mg s. The pharmacy tech states, "we have plenty of oxycodone 10mg s, if you can use them". Message sent to Dr Hinton Rao @ [phone].

## 2021-09-21 ENCOUNTER — Ambulatory Visit: Payer: Medicare Other | Attending: Cardiology | Admitting: Cardiology

## 2021-09-21 ENCOUNTER — Encounter: Payer: Self-pay | Admitting: Cardiology

## 2021-09-21 VITALS — BP 110/70 | HR 82 | Ht 70.0 in | Wt 227.4 lb

## 2021-09-21 DIAGNOSIS — I714 Abdominal aortic aneurysm, without rupture, unspecified: Secondary | ICD-10-CM | POA: Diagnosis not present

## 2021-09-21 DIAGNOSIS — E782 Mixed hyperlipidemia: Secondary | ICD-10-CM | POA: Diagnosis not present

## 2021-09-21 DIAGNOSIS — I7123 Aneurysm of the descending thoracic aorta, without rupture: Secondary | ICD-10-CM | POA: Diagnosis not present

## 2021-09-21 DIAGNOSIS — I110 Hypertensive heart disease with heart failure: Secondary | ICD-10-CM | POA: Insufficient documentation

## 2021-09-21 DIAGNOSIS — Z7901 Long term (current) use of anticoagulants: Secondary | ICD-10-CM | POA: Insufficient documentation

## 2021-09-21 NOTE — Progress Notes (Signed)
Date:  09/21/2021   ID:  John Maddox 1940-02-03, MRN [id number]  PCP:  Nicoletta Dress, MD  Cardiologist:  Shirlee More, MD    Referring MD: Nicoletta Dress, MD    ASSESSMENT:    1. Hypertensive heart disease with heart failure ([place])   2. Chronic anticoagulation   3. Aneurysm of descending thoracic aorta without rupture (HCC)   4. Abdominal aortic aneurysm (AAA) without rupture, unspecified part ([place])   5. Mixed hyperlipidemia    PLAN:    In order of problems listed above:  John Maddox continues to do well with his hypertensive heart disease he has been on good guideline directed medical therapy including carvedilol maximally tolerated dose Entresto and his loop diuretic he has no fluid overload I am concerned about the episode of weakness and his wife will trend his blood pressures and have her get numbers frequently less than 110 I will reduce the dose of his Entresto.  Also recheck echocardiogram has been 18 months. He continue lifelong anticoagulation Stable continue thoracic and abdominal aortic aneurysm he has serial maging through radiology and I think his overall health and medical core morbidities have make him a poor candidate for elective intervention LDL is at target continue with statin Stable lung cancer followed by oncology   Next appointment: 6 months   Medication Adjustments/Labs and Tests Ordered: Current medicines are reviewed at length with the patient today.  Concerns regarding medicines are outlined above.  No orders of the defined types were placed in this encounter.  No orders of the defined types were placed in this encounter.   Chief complaint follow-up for heart failure   History of Present Illness:    John Maddox is a 81 y.o. male with a hx of heart failure mildly reduced ejection fraction subsequently normalized venous thromboembolism on long-term anticoagulation with non-small cell lung cancer hypertensive heart disease  hyperlipidemia previous PEA arrest in the setting of pulmonary embolism severe COPD distal thoracic aortic aneurysm 5 cm and stage II CKD last seen by me 03/21/2021 with nonanginal point localized chest pain raising concern for bony injury or pathology.  X-ray showed possible left lateral rib fracture and findings of chronic lung disease.  Compliance with diet, lifestyle and medications: Yes  Overall continues to do well but had 1 episode where he was lightheaded and weak was seen in the ED no specific diagnosis. His wife previously was checking home blood pressure I asked her to resume.  We discussed reducing the dose of Entresto but he wants to hold and trend his blood pressures and will contact me if he is frequently less than 825 systolic or repeat episodes He has continued imaging through his oncologist his descending thoracic aorta is stable 5.7 cm as well as abdominal aortic aneurysm just proximal to his stent 4.8 x 4.4 cm.  Previously was seen by vascular surgery Dr. Maryjean Maddox.  I will send a copy of this note to him to see if he needs to bring the patient back to his office for continued follow-up  He continues with his anticoagulant he has had no bleeding complication Tolerant of his statin without muscle pain or weakness Recent labs 06/20/2021 LDL 56 cholesterol 111 A1c 5.9 hemoglobin 11.3 creatinine 1.3 potassium 4.0 Past Medical History:  Diagnosis Date   AAA (abdominal aortic aneurysm) without rupture ([place]) 08/03/2015   AKI (acute kidney injury) ([employer]) 01/08/2018   Last Assessment & Plan:  Improved Nephrology consulted Likely 2/2 ATN and  CIN in the setting of contrast at the outside hospital on 12/21, NSAIDs at the outside hospital, intra-operative hypotension, and entresto use. Avoid nephrotoxic medications(entresto/diuretic) Renally dose medications Strict I&O Monitor creatinine(1.68 today)     Anemia 01/17/2018   Last Assessment & Plan:  Due to chemotherapy and anemia of chronic disease Iron  panel shows anemia of chronic disease Follow up with PCP and oncologist   Arrhythmia    Asthma 09/23/2017   Atherosclerosis of native artery of both lower extremities ([place]) 08/03/2015   Bilateral carotid artery stenosis 08/03/2015   Cardiomyopathy ([place]) 11/18/2017   Chronic anticoagulation 06/05/2018   Chronic combined systolic and diastolic heart failure ([place]) 10/03/2017   Congestive heart failure ([place]) 09/23/2017   COPD (chronic obstructive pulmonary disease) ([place]) 09/23/2017   Diabetes mellitus ([place]) 09/23/2017   Diarrhea 01/14/2018   Last Assessment & Plan:  C diff - negative  GIP - negative Resolved   Diastolic dysfunction 05/22/8500   Duodenal nodule 11/10/2014   Esophageal reflux 09/23/2017   Essential hypertension 11/10/2014   Last Assessment & Plan:  Controlled off of medication Follow up with PCP as an outpatient   Fatigue 09/23/2017   Gout 09/23/2017   Hematuria 01/14/2018   Last Assessment & Plan:  Need UA as outpatient.   History of prostate cancer 09/23/2017   Hyperlipidemia 09/23/2017   Hypertension 09/23/2017   Hypertensive heart disease 09/23/2017   Hypertensive heart disease with heart failure ([place]) 06/06/2018   Hypokalemia 01/14/2018   Last Assessment & Plan:  Replace and follow up with PCP   Hypomagnesemia 01/14/2018   Last Assessment & Plan:  Replace and monitor and follow up with PCP   Hypophosphatemia 01/15/2018   Last Assessment & Plan:  Resolved   Infected prosthetic knee joint, initial encounter ([place]) 03/06/2018   Insulin dependent type 2 diabetes mellitus ([place]) 11/10/2014   Last Assessment & Plan:  Glucose 109-140 Continue ISS Monitor glucose   Left ventricular dysfunction 10/03/2017   Lung cancer ([place])    Lung mass 10/04/2017   Non-small cell lung cancer ([place]) 10/03/2017   Pulmonary emboli ([place]) 03/06/2018   Pulmonary embolism (HCC)    PVC's (premature ventricular contractions) 09/23/2017   PVD (peripheral vascular disease) ([place]) 11/10/2014   Septic arthritis of knee, left ([place])  01/04/2018   Added automatically from request for surgery [id number]  Last Assessment & Plan:  Prosthetic knee infection S/p wash out Culture negative  Continue Dapto + Ceftriaxone + Rifampin for 4 weeks(currently day 7) ID was notified today prior to discharge He will need CBC, CMP, ESR, CRP weekly faxed to [phone](OPAT with ID) ID outpatient appointment is on 02/17/2017  Antibiotics administered by port by ho   Thrombocytopenia ([place]) 01/15/2018   Last Assessment & Plan:  Decreased to 78.  Due to chemotherapy Peripheral smear negative for schistocytes LDH 211(within normal limits), haptoglobin 329(elevated), Retic 1.7 B12=714, Folate=20, copper pending    Past Surgical History:  Procedure Laterality Date   ABDOMINAL AORTIC ANEURYSM REPAIR     AORTA - ILIAC ARTERY BYPASS GRAFT     BACK SURGERY     CATARACT EXTRACTION, BILATERAL     CHOLECYSTECTOMY     HEMICOLECTOMY     HERNIA REPAIR     KNEE SURGERY Left    replaced all the hardwear   PROSTATE SURGERY     REPLACEMENT TOTAL KNEE     STENT PLACEMENT VASCULAR ([place] [facility])     TONSILLECTOMY      Current Medications: Current Meds  Medication Sig   allopurinol (ZYLOPRIM) 300 MG tablet Take 300 mg by mouth daily.   apixaban (ELIQUIS) 5 MG TABS tablet Take 1 tablet (5 mg total) by mouth 2 (two) times daily.   carvedilol (COREG) 3.125 MG tablet TAKE 1 TABLET TWICE A DAY   ENTRESTO 97-103 MG TAKE 1 TABLET TWICE A DAY   fluticasone-salmeterol (ADVAIR HFA) 115-21 MCG/ACT inhaler Inhale 2 puffs into the lungs 2 (two) times daily.    gabapentin (NEURONTIN) 100 MG capsule Take 100 mg by mouth 2 (two) times daily.   metFORMIN (GLUCOPHAGE) 500 MG tablet Take 500 mg by mouth daily with breakfast. 2 tablets daily   omeprazole (PRILOSEC) 20 MG capsule Take 20 mg by mouth daily.   Oxycodone HCl 10 MG TABS Take 0.5 tablets (5 mg total) by mouth every 4 (four) hours as needed.   OXYGEN Inhale into the lungs continuous. 2 liters   PROAIR HFA 108 (90 Base)  MCG/ACT inhaler Inhale 1 puff into the lungs every 6 (six) hours as needed.    simvastatin (ZOCOR) 40 MG tablet Take 1 tablet (40 mg total) by mouth daily.   SPIRIVA RESPIMAT 2.5 MCG/ACT AERS SMARTSIG:2 Puff(s) Via Inhaler Daily   tamsulosin (FLOMAX) 0.4 MG CAPS capsule Take 0.4 mg by mouth daily.   torsemide (DEMADEX) 20 MG tablet TAKE 1 TABLET DAILY, TAKE 1 EXTRA TABLET AT NOON ON MONDAY, WEDNESDAY, AND FRIDAY     Allergies:   Lantus [insulin glargine] and Quinolones   Social History   Socioeconomic History   Marital status: Married    Spouse name: Not on file   Number of children: Not on file   Years of education: Not on file   Highest education level: Not on file  Occupational History   Not on file  Tobacco Use   Smoking status: Former    Packs/day: 2.50    Years: 40.00    Total pack years: 100.00    Types: Cigarettes    Quit date: 2014    Years since quitting: 9.6    Passive exposure: Past   Smokeless tobacco: Never  Vaping Use   Vaping Use: Never used  Substance and Sexual Activity   Alcohol use: Not Currently   Drug use: Not Currently   Sexual activity: Not on file  Other Topics Concern   Not on file  Social History Narrative   Not on file   Social Determinants of Health   Financial Resource Strain: Not on file  Food Insecurity: Not on file  Transportation Needs: Not on file  Physical Activity: Not on file  Stress: Not on file  Social Connections: Not on file     Family History: The patient's family history includes Alcohol abuse in his father; Arthritis in his brother, father, and mother; Heart attack in his father; Heart disease in his father; Hyperlipidemia in his father; Hypertension in his father and mother; Lung cancer in his brother and mother; Prostate cancer in his father. ROS:   Please see the history of present illness.    All other systems reviewed and are negative.  EKGs/Labs/Other Studies Reviewed:    The following studies were reviewed  today:  See history  Physical Exam:    VS:  BP 110/70 (BP Location: Right Arm, Patient Position: Sitting, Cuff Size: Normal)   Pulse 82   Ht _0  (1.778 m)   Wt 227 lb 6.4 oz (103.1 kg)   SpO2 96%   BMI 32.63 kg/m  Wt Readings from Last 3 Encounters:  09/21/21 227 lb 6.4 oz (103.1 kg)  08/02/21 229 lb 8 oz (104.1 kg)  05/03/21 233 lb 9.6 oz (106 kg)     GEN:  Well nourished, well developed in no acute distress HEENT: Normal NECK: No JVD; No carotid bruits LYMPHATICS: No lymphadenopathy CARDIAC: RRR, no murmurs, rubs, gallops RESPIRATORY:  Clear to auscultation without rales, wheezing or rhonchi  ABDOMEN: Soft, non-tender, non-distended MUSCULOSKELETAL:  No edema; No deformity  SKIN: Warm and dry NEUROLOGIC:  Alert and oriented x 3 PSYCHIATRIC:  Normal affect    Signed, Shirlee More, MD  09/21/2021 3:27 PM    [facility]

## 2021-09-21 NOTE — Patient Instructions (Signed)
Medication Instructions:  Your physician recommends that you continue on your current medications as directed. Please refer to the Current Medication list given to you today.  *If you need a refill on your cardiac medications before your next appointment, please call your pharmacy*   Lab Work: None If you have labs (blood work) drawn today and your tests are completely normal, you will receive your results only by: [place] (if you have MyChart) OR A paper copy in the mail If you have any lab test that is abnormal or we need to change your treatment, we will call you to review the results.   Testing/Procedures: Your physician has requested that you have an echocardiogram. Echocardiography is a painless test that uses sound waves to create images of your heart. It provides your doctor with information about the size and shape of your heart and how well your heart's chambers and valves are working. This procedure takes approximately one hour. There are no restrictions for this procedure.    Follow-Up: At [facility], you and your health needs are our priority.  As part of our continuing mission to provide you with exceptional heart care, we have created designated Provider Care Teams.  These Care Teams include your primary Cardiologist (physician) and Advanced Practice Providers (APPs -  Physician Assistants and Nurse Practitioners) who all work together to provide you with the care you need, when you need it.  We recommend signing up for the patient portal called "MyChart".  Sign up information is provided on this After Visit Summary.  MyChart is used to connect with patients for Virtual Visits (Telemedicine).  Patients are able to view lab/test results, encounter notes, upcoming appointments, etc.  Non-urgent messages can be sent to your provider as well.   To learn more about what you can do with MyChart, go to [email or web address].    Your next appointment:   6  month(s)  The format for your next appointment:   In Person  Provider:   Shirlee More, MD    Other Instructions Check blood pressure at home and contact the office if systolic is frequently < 277.  Important Information About Sugar

## 2021-10-04 ENCOUNTER — Ambulatory Visit: Payer: Medicare Other | Attending: Cardiology

## 2021-10-04 DIAGNOSIS — E782 Mixed hyperlipidemia: Secondary | ICD-10-CM

## 2021-10-04 DIAGNOSIS — I714 Abdominal aortic aneurysm, without rupture, unspecified: Secondary | ICD-10-CM

## 2021-10-04 DIAGNOSIS — I7123 Aneurysm of the descending thoracic aorta, without rupture: Secondary | ICD-10-CM | POA: Diagnosis not present

## 2021-10-04 DIAGNOSIS — Z7901 Long term (current) use of anticoagulants: Secondary | ICD-10-CM | POA: Diagnosis not present

## 2021-10-04 DIAGNOSIS — I110 Hypertensive heart disease with heart failure: Secondary | ICD-10-CM

## 2021-10-04 LAB — ECHOCARDIOGRAM COMPLETE
Area-P 1/2: 2.8 cm2
P 1/2 time: 503 msec
S' Lateral: 3.5 cm

## 2021-10-20 ENCOUNTER — Other Ambulatory Visit: Payer: Self-pay

## 2021-10-20 DIAGNOSIS — I110 Hypertensive heart disease with heart failure: Secondary | ICD-10-CM | POA: Diagnosis not present

## 2021-10-20 DIAGNOSIS — K219 Gastro-esophageal reflux disease without esophagitis: Secondary | ICD-10-CM | POA: Diagnosis not present

## 2021-10-20 DIAGNOSIS — M109 Gout, unspecified: Secondary | ICD-10-CM | POA: Diagnosis not present

## 2021-10-20 DIAGNOSIS — Z86711 Personal history of pulmonary embolism: Secondary | ICD-10-CM | POA: Diagnosis not present

## 2021-10-20 DIAGNOSIS — Z79899 Other long term (current) drug therapy: Secondary | ICD-10-CM | POA: Diagnosis not present

## 2021-10-20 DIAGNOSIS — Z1331 Encounter for screening for depression: Secondary | ICD-10-CM | POA: Diagnosis not present

## 2021-10-20 DIAGNOSIS — E785 Hyperlipidemia, unspecified: Secondary | ICD-10-CM | POA: Diagnosis not present

## 2021-10-20 DIAGNOSIS — Z23 Encounter for immunization: Secondary | ICD-10-CM | POA: Diagnosis not present

## 2021-10-20 DIAGNOSIS — G8929 Other chronic pain: Secondary | ICD-10-CM

## 2021-10-20 DIAGNOSIS — C3491 Malignant neoplasm of unspecified part of right bronchus or lung: Secondary | ICD-10-CM

## 2021-10-20 DIAGNOSIS — Z85118 Personal history of other malignant neoplasm of bronchus and lung: Secondary | ICD-10-CM | POA: Diagnosis not present

## 2021-10-20 DIAGNOSIS — T451X5A Adverse effect of antineoplastic and immunosuppressive drugs, initial encounter: Secondary | ICD-10-CM | POA: Diagnosis not present

## 2021-10-20 DIAGNOSIS — E1169 Type 2 diabetes mellitus with other specified complication: Secondary | ICD-10-CM | POA: Diagnosis not present

## 2021-10-20 DIAGNOSIS — G620 Drug-induced polyneuropathy: Secondary | ICD-10-CM | POA: Diagnosis not present

## 2021-10-20 MED ORDER — OXYCODONE HCL 10 MG PO TABS: 5.0000 mg | ORAL_TABLET | ORAL | 0 refills | Status: DC | PRN

## 2021-10-30 ENCOUNTER — Telehealth: Payer: Self-pay | Admitting: Cardiology

## 2021-10-30 NOTE — Telephone Encounter (Signed)
Pt returning nurses call regarding Echo results. Please advise

## 2021-10-30 NOTE — Telephone Encounter (Signed)
Results reviewed with Velva Harman per DPR as per Dr. Joya Gaskins note.  Velva Harman verbalized understanding and had no additional questions. Routed to PCP

## 2021-10-31 DIAGNOSIS — C3491 Malignant neoplasm of unspecified part of right bronchus or lung: Secondary | ICD-10-CM | POA: Diagnosis not present

## 2021-10-31 LAB — CBC
RBC: 4.3 (ref 3.87–5.11)
RBC: 4.3 (ref 3.87–5.11)

## 2021-10-31 LAB — COMPREHENSIVE METABOLIC PANEL
Albumin: 3.8 (ref 3.5–5.0)
Albumin: 3.8 (ref 3.5–5.0)
Calcium: 9.2 (ref 8.7–10.7)
Calcium: 9.2 (ref 8.7–10.7)

## 2021-10-31 LAB — BASIC METABOLIC PANEL
BUN: 14 (ref 4–21)
BUN: 14 (ref 4–21)
CO2: 28 — AB (ref 13–22)
CO2: 28 — AB (ref 13–22)
Chloride: 106 (ref 99–108)
Chloride: 106 (ref 99–108)
Creatinine: 1 (ref 0.6–1.3)
Creatinine: 1 (ref 0.6–1.3)
Glucose: 102
Glucose: 102
Potassium: 4.2 mEq/L (ref 3.5–5.1)
Potassium: 4.2 mEq/L (ref 3.5–5.1)
Sodium: 137 (ref 137–147)
Sodium: 137 (ref 137–147)

## 2021-10-31 LAB — CBC AND DIFFERENTIAL
HCT: 38 — AB (ref 41–53)
HCT: 38 — AB (ref 41–53)
Hemoglobin: 12.5 — AB (ref 13.5–17.5)
Hemoglobin: 12.5 — AB (ref 13.5–17.5)
Neutrophils Absolute: 3.64
Neutrophils Absolute: 3.64
Platelets: 169 10*3/uL (ref 150–400)
Platelets: 169 10*3/uL (ref 150–400)
WBC: 5.6
WBC: 5.6

## 2021-10-31 LAB — HEPATIC FUNCTION PANEL
ALT: 27 U/L (ref 10–40)
ALT: 27 U/L (ref 10–40)
AST: 33 (ref 14–40)
AST: 33 (ref 14–40)
Alkaline Phosphatase: 145 — AB (ref 25–125)
Alkaline Phosphatase: 145 — AB (ref 25–125)
Bilirubin, Total: 0.4
Bilirubin, Total: 0.4

## 2021-10-31 LAB — CEA: CEA: 1.7

## 2021-11-02 ENCOUNTER — Inpatient Hospital Stay: Payer: Medicare Other | Attending: Oncology | Admitting: Oncology

## 2021-11-02 ENCOUNTER — Encounter: Payer: Self-pay | Admitting: Oncology

## 2021-11-02 ENCOUNTER — Other Ambulatory Visit: Payer: Self-pay | Admitting: Oncology

## 2021-11-02 ENCOUNTER — Telehealth: Payer: Self-pay | Admitting: Oncology

## 2021-11-02 VITALS — BP 123/75 | HR 92 | Temp 97.5°F | Resp 20 | Ht 70.0 in | Wt 228.3 lb

## 2021-11-02 DIAGNOSIS — C3491 Malignant neoplasm of unspecified part of right bronchus or lung: Secondary | ICD-10-CM | POA: Diagnosis not present

## 2021-11-02 NOTE — Telephone Encounter (Signed)
11/02/21 Next appt scheduled and confirmed with patient

## 2021-11-02 NOTE — Progress Notes (Signed)
[facility]  [address] [place],  [place]  [address] [phone]  Clinic Day:  11/02/2021  Referring physician: Nicoletta Dress, MD  ASSESSMENT & PLAN:   Assessment & Plan:  Non-small cell lung cancer Stage IIIA non-small cell lung cancer with partial response to concurrent chemoradiation.  He completed 1 year of durvalumab in March 2021 and tolerated this without significant difficulty.  CT imaging in April revealed continued improvement.  PET scan from July 2021 revealed a large area of treated tumor/radiation fibrosis involving the right lower lobe with no hypermetabolism to suggest residual or recurrent tumor.  No evidence of metastatic disease was observed.  CT scan from September 2022 revealed unchanged post treatment/post radiation appearance of the right lung, with perihilar fibrosis and consolidation. His scan from May 01, 2021 remains stable.  The most recent scan from October 31, 2021 remains stable with a band of thickening of the pleural-parenchymal right lower lobe but no evidence of lung cancer recurrence or metastasis.  Anemia This is largely anemia of chronic disease.  His hemoglobin has come up from 11.3 to 12.5 at this time.   I reviewed the test results with the patient and his wife and all looks good.  He is clinically doing well and so I can see him back in 3 months with CBC, CEA, and plan to flush the port. His port was flushed when he got his CT scan.  The patient understands the plans discussed today and is in agreement with them.  He knows to contact our office if he develops concerns prior to his next appointment.    John Kaplan, MD  [facility] [address] [place] [address] Dept: [phone] Dept Fax: [phone]      CHIEF COMPLAINT:  CC: Stage IIIA non-small cell lung cancer  Current Treatment: Observation   HISTORY OF  PRESENT ILLNESS:  John Maddox. [name] is a 81 y.o. male with stage IIIA (T3 N1 M0) non-small cell lung cancer diagnosed in September 2019.  He had presented with severe bradycardia and left chest discomfort and was found to have a 6.2 cm mass in the right upper lobe with 1 paratracheal node measuring 1-2 cm.  Bronchoscopy was done and the 1st set of cytology and pathology results were negative.  Bronchial brushings and washings revealed rare atypical cells suspicious for malignancy in 1 specimen and malignant cells definitely consistent with non-small cell carcinoma of the lung, favoring adenocarcinoma, in a 2nd specimen.  We recommended concurrent chemoradiation with weekly carboplatin/paclitaxel.  He has many comorbidities including moderate emphysema, a distal descending thoracic aortic aneurysm measuring 4.4 cm, hypertension, congestive heart failure, diabetes mellitus, chronic kidney disease, multiple aortic aneurysms, peripheral artery disease, and history of prostate cancer 11 years ago.  His Cardiolite scan revealed mild global hypokinesis with an ejection fraction of 37%.  He also has pre-existing peripheral neuropathy.  MRI of the head did not reveal any evidence of intracranial metastasis.  He received 7 cycles of chemotherapy, which was completed in December 2019.  Unfortunately, the patient became quite ill and septic later in December 2019.  He was transferred from [facility] to [facility].  He was found to have septic arthritis of his knee and was placed on IV antibiotics at home with daptomycin and ceftriaxone as well as rifampin.  The hemoglobin was 8.0 when he was discharged and continued to drop to 7.3  and then 7.1.  We were eventually able to transfuse him as an inpatient.   When the patient went to see Dr. Linus Salmons of Infectious Disease in February 2020, he collapsed prior to getting his labs drawn and had cardiorespiratory arrest requiring resuscitation.  He was transported  to the emergency room and admitted.  He was found to have acute pulmonary embolism of small to moderate size, but no significant right heart strain.  He had deep venous thrombosis of the right femoral vein, right popliteal vein and right posterior tibial vein.  He was placed on heparin IV and transitioned to apixaban 5 mg twice daily, which he continues.  He was also found to have aspiration pneumonia during that hospitalization. A CT chest revealed the spiculated mass in the posterior right upper lobe was slightly smaller measuring 3.8 cm in maximum diameter, decreased from 4.0 cm.  There was stable right hilar adenopathy measuring 1.4 cm.  There was a small right pleural effusion.   He started maintenance durvalumab in March 2020 and tolerated it well.  The patient had a repeat echocardiogram, which showed improvement in his ejection fraction.  CT chest in early June revealed decrease in the size of the right upper lobe mass to 2.4 cm, with decrease in the pleural effusion.  There was a stable thoracic aortic aneurysm measuring 4.7 cm.  When he was seen in June, he reported intermittent dizziness, but MRI brain did not reveal intracranial metastasis or other acute abnormality.  Repeat CT chest in September 2020 revealed improvement in the size of the right upper lobe pulmonary nodule from 2.4 cm to 1.9 cm, with a possible right lower lobe pneumonia.  CT chest in January 2021 revealed slight interval increase in dense, post treatment fibrotic consolidation and volume loss of the perihilar right upper lobe, which is consistent with maturation of radiation fibrosis.  The previously seen clustered nodularity of the right lower lobe was essentially resolved, consistent with resolved atypical infection or inflammation.  Atherosclerotic, tortuous and aneurysmal thoracic aorta, the distal arch measuring approximately 5.0 cm and the tortuous descending thoracic aorta focally aneurysmal and measuring approximately 5.1 cm  in greatest caliber orthogonal to flow was re-demonstrated.  I recommended [employer] testing of the peripheral blood, and these results show no reportable alterations with companion diagnostic claims.  Other biomarkers with potential clinical significance, included DNMT3A and TET2.  He completed a total of 1 year of durvalumab therapy on March 10th.  CT chest in April revealed post treatment changes about the right hilum measuring 6.9 x 2.5 cm, previously 7.4 x 3.4 cm.  There were nodular changes in the left lung base developing along the margin of the aorta measuring 1.7 x 1.1 cm, more conspicuous than on the previous exam.  Stable appearance of the thoracic aorta with tortuosity and dilation.  The upper abdominal aortic aneurysm with mural thrombus is similar in appearance measuring 4.6 cm.  PET imaging from July 2021 revealed a large are of treated tumor/radiation fibrosis involving the right lower lobe.  There was no hypermetabolism to suggest residual or recurrent tumor, no findings for mediastinal/hilar adenopathy or pulmonary metastatic disease.  There was no evidence of abdominal/pelvic metastatic disease or osseous metastatic disease.  Stable, severe vascular disease was seen.  The upper abdominal aorta measures 4.4 cm.  CT from January revealed stable right infrahilar soft tissue at the site of previous treatment.  Decreased size of nodular focus at the left lung base now measuring 13 x 10  mm as compared to 17 x 10 mm. Likely nodular scarring. Tortuous and aneurysmal appearance of the thoracoabdominal aorta with similar appearance.  CT chest, abdomen and pelvis from September of 2022 revealed unchanged post treatment/post radiation appearance of the right lung, with perihilar fibrosis and consolidation with no evidence of recurrent or metastatic disease. Redemonstrated aneurysmal aortic arch and descending thoracic aorta, maximum caliber of the distal arch approximately 4.6 x 4.2 cm and of the distal  descending thoracic aorta approximately 5.2 x 5.2 cm.  INTERVAL HISTORY:  John Maddox is here today for repeat clinical assessment. He continues to see Dr.Schultz every 3 months. He states he has been having generalized pain all over (in the joints). He had a question about his equilibrium being off, since he is partial deaf, will it be thrown off. He also asked if hearing aids would help. I have suggested he speak with a hearing specialist with those concerns. CBC, CMP and CEA are normal. The scan from October 31, 2021 remains stable with a band of thickening of the pleural-parenchymal right lower lobe but no evidence of lung cancer recurrence or metastasis.He continues to use a wheelchair. He continues oxycodone 5 mg every 6 hours with fair control of his pain.  He denies fevers or chills.  His appetite is good. His weight increased by 1 pound since his last visit. He denies cough, chest pain or progressive dyspnea.     REVIEW OF SYSTEMS:  Review of Systems  Constitutional:  Positive for fatigue. Negative for appetite change, chills, fever and unexpected weight change.  HENT:   Negative for lump/mass, mouth sores and sore throat.   Respiratory:  Positive for shortness of breath (With exertion). Negative for cough.   Cardiovascular:  Negative for chest pain and leg swelling.  Gastrointestinal:  Negative for abdominal pain, constipation, diarrhea, nausea and vomiting.  Genitourinary:  Negative for difficulty urinating, dysuria, frequency and hematuria.   Musculoskeletal:  Positive for arthralgias (Chronic, stable due to degenerative disease) and gait problem (Due to degenerative disease). Negative for back pain and myalgias.  Skin:  Negative for itching, rash and wound.  Neurological:  Positive for gait problem (Due to degenerative disease). Negative for dizziness, extremity weakness, headaches, light-headedness and numbness.  Hematological:  Negative for adenopathy.  Psychiatric/Behavioral:  Negative  for depression and sleep disturbance. The patient is not nervous/anxious.      VITALS:  Blood pressure 123/75, pulse 92, temperature (!) 97.5 F (36.4 C), temperature source Oral, resp. rate 20, height '5\' 10"'  (1.778 m), weight 228 lb 4.8 oz (103.6 kg), SpO2 97 %.  Wt Readings from Last 3 Encounters:  11/02/21 228 lb 4.8 oz (103.6 kg)  09/21/21 227 lb 6.4 oz (103.1 kg)  08/02/21 229 lb 8 oz (104.1 kg)    Body mass index is 32.76 kg/m.  Performance status (ECOG): 2 - Symptomatic, <50% confined to bed  PHYSICAL EXAM:  Physical Exam Vitals and nursing note reviewed.  Constitutional:      General: He is not in acute distress.    Appearance: Normal appearance. He is normal weight.  HENT:     Head: Normocephalic and atraumatic.     Mouth/Throat:     Mouth: Mucous membranes are moist.     Pharynx: Oropharynx is clear. No oropharyngeal exudate or posterior oropharyngeal erythema.  Eyes:     General: No scleral icterus.    Extraocular Movements: Extraocular movements intact.     Conjunctiva/sclera: Conjunctivae normal.     Pupils: Pupils are  equal, round, and reactive to light.  Cardiovascular:     Rate and Rhythm: Normal rate and regular rhythm.     Heart sounds: Normal heart sounds. No murmur heard.    No friction rub. No gallop.  Pulmonary:     Effort: Pulmonary effort is normal.     Breath sounds: Normal breath sounds. No wheezing, rhonchi or rales.     Comments: MILD COARSE BRONCHI Abdominal:     General: Bowel sounds are normal. There is no distension.     Palpations: Abdomen is soft. There is no hepatomegaly, splenomegaly or mass.     Tenderness: There is no abdominal tenderness.  Musculoskeletal:        General: Normal range of motion.     Cervical back: Normal range of motion and neck supple. No tenderness.     Right lower leg: Edema (1+) present.     Left lower leg: Edema (1+) present.     Comments: 1-2+ edema bilateral  Lymphadenopathy:     Cervical: No cervical  adenopathy.     Upper Body:     Right upper body: No supraclavicular or axillary adenopathy.     Left upper body: No supraclavicular or axillary adenopathy.     Lower Body: No right inguinal adenopathy. No left inguinal adenopathy.  Skin:    General: Skin is warm and dry.     Coloration: Skin is not jaundiced.     Findings: No rash.  Neurological:     Mental Status: He is alert and oriented to person, place, and time.     Cranial Nerves: No cranial nerve deficit.  Psychiatric:        Mood and Affect: Mood normal.        Behavior: Behavior normal.        Thought Content: Thought content normal.     LABS:      Latest Ref Rng & Units 10/31/2021   12:00 AM 08/02/2021   12:00 AM 05/01/2021   12:00 AM  CBC  WBC  5.6       5.6     5.9     5.7      Hemoglobin 13.5 - 17.5 13.5 - 17.5 12.5       12.5     11.3     12.0      Hematocrit 41 - 53 41 - 53 38       38     35     38      Platelets 150 - 400 K/uL 150 - 400 K/uL 169       169     156     174         This result is from an external source.   Multiple values from one day are sorted in reverse-chronological order      Latest Ref Rng & Units 10/31/2021   12:00 AM 08/02/2021   12:00 AM 05/01/2021   12:00 AM  CMP  BUN 4 - 21 4 - '21 14       14     21     20      ' Creatinine 0.6 - 1.3 0.6 - 1.3 1.0       1.0     1.3     1.3      Sodium 137 - 147 137 - 147 137       137     141     138  Potassium 3.5 - 5.1 mEq/L 3.5 - 5.1 mEq/L 4.2       4.2     4.0     4.6      Chloride 99 - 108 99 - 108 106       106     107     101      CO2 13 - 22 13 - '22 28       28     27     26      ' Calcium 8.7 - 10.7 8.7 - 10.7 9.2       9.2     8.8     9.0      Alkaline Phos 25 - 125 25 - 125 145       145     143     137      AST 14 - 40 14 - 40 33       33     21     22      ALT 10 - 40 U/L 10 - 40 U/L '27       27     18     19         ' This result is from an external source.   Multiple values from one day are sorted in  reverse-chronological order     Lab Results  Component Value Date   CEA1 2.1 02/01/2021   CEA 1.7 10/31/2021   CEA 1.7 10/31/2021   /  CEA  Date Value Ref Range Status  10/31/2021 1.7  Final  10/31/2021 1.7  Final  02/01/2021 2.1 0.0 - 4.7 ng/mL Final    Comment:    (NOTE)                             Nonsmokers          <3.9                             Smokers             <5.6 [employer] Electrochemiluminescence Immunoassay (ECLIA) Values obtained with different assay methods or kits cannot be used interchangeably.  Results cannot be interpreted as absolute evidence of the presence or absence of malignant disease. Performed At: [facility] [place], [place] [id number] Rush Farmer MD GU:[id number]    STUDIES:  EXAM:10/31/21 CT CHEST WITH CONTRAST IMPRESSION: Stable band of pleura-parenchymal thickening in the RIGHT lower lobe at prior lung cancer treatment sitre. No evidence of lung cancer recurrence or metastasis. Stable aneurysmal dilation of the proximal and distal descending thoracic aorta. Coronary artery calcification and Aortic Atherosclerosis. -  EXAM: 05/01/21 CT CHEST, ABDOMEN, AND PELVIS WITH CONTRAST  TECHNIQUE:  Multidetector CT imaging of the chest, abdomen and pelvis was  performed following the standard protocol during bolus  administration of intravenous contrast.  RADIATION DOSE REDUCTION: This exam was performed according to the  departmental dose-optimization program which includes automated  exposure control, adjustment of the mA and/or kV according to  patient size and/or use of iterative reconstruction technique.  CONTRAST: 80 cc Isovue 370  COMPARISON: CTs 10/05/2020 and 02/10/2020. PET-CT 08/11/2019.   FINDINGS:  CT CHEST FINDINGS  Cardiovascular: No acute vascular findings are identified. Left IJ  Port-A-Cath tip extends to the superior cavoatrial junction. There  is atherosclerosis  of the aorta, great  vessels and coronary  arteries. The thoracic aorta is tortuous and mildly dilated,  unchanged from the previous study. The descending thoracic aorta  measures up to 5.7 cm on coronal image 82/601, unchanged from  previous study (remeasured). The heart size is normal. There is no  pericardial effusion.  Mediastinum/Nodes: There are no enlarged mediastinal, hilar or  axillary lymph nodes. The thyroid gland, trachea and esophagus  demonstrate no significant findings.  Lungs/Pleura: No pleural effusion or pneumothorax. Moderate  centrilobular emphysema. Stable bandlike scarring in the right  perihilar region with architectural distortion and volume loss  attributed to prior radiation therapy. No evidence of local  recurrence or suspicious pulmonary nodule. Stable mild scarring in  the lingula and medial left lower lobe.  Musculoskeletal/Chest wall: No chest wall mass or suspicious osseous  findings.   CT ABDOMEN AND PELVIS FINDINGS  Hepatobiliary: The liver is normal in density without suspicious  focal abnormality. Stable low-density lesions anteriorly in the  liver, likely focal fat. No evidence of biliary dilatation status  post cholecystectomy.  Pancreas: Unremarkable. No pancreatic ductal dilatation or  surrounding inflammatory changes.  Spleen: Normal in size without focal abnormality.  Adrenals/Urinary Tract: Both adrenal glands appear normal. Small  nonobstructing right renal calculi. No evidence of ureteral calculus  or hydronephrosis. Both kidneys demonstrate mild cortical thinning  without focal abnormality. The bladder appears unremarkable.  Stomach/Bowel: Enteric contrast was administered and has passed into  the mid colon. The stomach appears unremarkable for its degree of  distension. No evidence of bowel wall thickening, distention or  surrounding inflammatory change. Multiple prominent duodenal  diverticula are again noted. There is diverticulosis of the  descending  and sigmoid colon. The appendix appears normal.  Vascular/Lymphatic: There are no enlarged abdominal or pelvic lymph  nodes. Stable postsurgical changes status post aorto bi-iliac stent  graft repair. Just proximal to the stent graft, there is stable  aneurysmal dilatation of the aorta which measures 4.8 x 4.3 cm on  image 62/2 (previously 4.8 x 4.4 cm). No evidence of large vessel  occlusion or retroperitoneal hematoma.  Reproductive: Prostate fiducial markers are noted without change in  appearance of the gland.  Other: Stable small umbilical hernia containing only fat. Stable  mildly prominent fat in both inguinal canals. No ascites or  peritoneal nodularity.  Musculoskeletal: No acute or significant osseous findings.  Degenerative and postsurgical changes throughout the lumbar spine.  Convex right scoliosis. Moderate bilateral hip arthropathy.   IMPRESSION:  1. Stable treatment changes in the right lung without evidence of  local recurrence or metastatic disease.  2. No evidence of metastatic disease or acute findings in the  abdomen or pelvis.  3. Status post aorto iliac stent grafting with stable aneurysmal  dilatation of the descending thoracic and proximal abdominal aorta.  Follow-up as clinically warranted.  4. Coronary and aortic atherosclerosis (ICD10-I70.0). Emphysema  (ICD10-J43.9).   Electronically Signed  By: Richardean Sale M.D.  On: 05/02/2021 12:55   HISTORY:   Past Medical History:  Diagnosis Date   AAA (abdominal aortic aneurysm) without rupture ([place]) 08/03/2015   AKI (acute kidney injury) ([facility]) 01/08/2018   Last Assessment & Plan:  Improved Nephrology consulted Likely 2/2 ATN and CIN in the setting of contrast at the outside hospital on 12/21, NSAIDs at the outside hospital, intra-operative hypotension, and entresto use. Avoid nephrotoxic medications(entresto/diuretic) Renally dose medications Strict I&O Monitor creatinine(1.68 today)     Anemia 01/17/2018    Last Assessment &  Plan:  Due to chemotherapy and anemia of chronic disease Iron panel shows anemia of chronic disease Follow up with PCP and oncologist   Arrhythmia    Asthma 09/23/2017   Atherosclerosis of native artery of both lower extremities ([place]) 08/03/2015   Bilateral carotid artery stenosis 08/03/2015   Cardiomyopathy ([place]) 11/18/2017   Chronic anticoagulation 06/05/2018   Chronic combined systolic and diastolic heart failure ([place]) 10/03/2017   Congestive heart failure ([place]) 09/23/2017   COPD (chronic obstructive pulmonary disease) ([facility]) 09/23/2017   Diabetes mellitus ([place]) 09/23/2017   Diarrhea 01/14/2018   Last Assessment & Plan:  C diff - negative  GIP - negative Resolved   Diastolic dysfunction 06/24/6787   Duodenal nodule 11/10/2014   Esophageal reflux 09/23/2017   Essential hypertension 11/10/2014   Last Assessment & Plan:  Controlled off of medication Follow up with PCP as an outpatient   Fatigue 09/23/2017   Gout 09/23/2017   Hematuria 01/14/2018   Last Assessment & Plan:  Need UA as outpatient.   History of prostate cancer 09/23/2017   Hyperlipidemia 09/23/2017   Hypertension 09/23/2017   Hypertensive heart disease 09/23/2017   Hypertensive heart disease with heart failure ([place]) 06/06/2018   Hypokalemia 01/14/2018   Last Assessment & Plan:  Replace and follow up with PCP   Hypomagnesemia 01/14/2018   Last Assessment & Plan:  Replace and monitor and follow up with PCP   Hypophosphatemia 01/15/2018   Last Assessment & Plan:  Resolved   Infected prosthetic knee joint, initial encounter ([facility]) 03/06/2018   Insulin dependent type 2 diabetes mellitus ([facility]) 11/10/2014   Last Assessment & Plan:  Glucose 109-140 Continue ISS Monitor glucose   Left ventricular dysfunction 10/03/2017   Lung cancer ([facility])    Lung mass 10/04/2017   Non-small cell lung cancer ([place]) 10/03/2017   Pulmonary emboli ([place]) 03/06/2018   Pulmonary embolism ([facility])    PVC's (premature ventricular contractions) 09/23/2017   PVD (peripheral  vascular disease) ([facility]) 11/10/2014   Septic arthritis of knee, left ([facility]) 01/04/2018   Added automatically from request for surgery [id number]  Last Assessment & Plan:  Prosthetic knee infection S/p wash out Culture negative  Continue Dapto + Ceftriaxone + Rifampin for 4 weeks(currently day 7) ID was notified today prior to discharge He will need CBC, CMP, ESR, CRP weekly faxed to [phone]([facility] with ID) ID outpatient appointment is on 02/17/2017  Antibiotics administered by port by ho   Thrombocytopenia ([facility]) 01/15/2018   Last Assessment & Plan:  Decreased to 78.  Due to chemotherapy Peripheral smear negative for schistocytes LDH 211(within normal limits), haptoglobin 329(elevated), Retic 1.7 B12=714, Folate=20, copper pending    Past Surgical History:  Procedure Laterality Date   ABDOMINAL AORTIC ANEURYSM REPAIR     AORTA - ILIAC ARTERY BYPASS GRAFT     BACK SURGERY     CATARACT EXTRACTION, BILATERAL     CHOLECYSTECTOMY     HEMICOLECTOMY     HERNIA REPAIR     KNEE SURGERY Left    replaced all the hardwear   PROSTATE SURGERY     REPLACEMENT TOTAL KNEE     STENT PLACEMENT VASCULAR ([facility])     TONSILLECTOMY      Family History  Problem Relation Age of Onset   Arthritis Mother    Lung cancer Mother    Hypertension Mother    Arthritis Father    Heart attack Father    Prostate cancer Father    Hypertension Father    Hyperlipidemia  Father    Heart disease Father    Alcohol abuse Father    Arthritis Brother    Lung cancer Brother     Social History:  reports that he quit smoking about 9 years ago. His smoking use included cigarettes. He has a 100.00 pack-year smoking history. He has been exposed to tobacco smoke. He has never used smokeless tobacco. He reports that he does not currently use alcohol. He reports that he does not currently use drugs.The patient is accompanied by his wife today.  Allergies:  Allergies  Allergen Reactions   Lantus [Insulin Glargine]     Stomach pain    Quinolones Other (See Comments)    History of aortic aneurysm status post repair.  Use with caution    Current Medications: Current Outpatient Medications  Medication Sig Dispense Refill   allopurinol (ZYLOPRIM) 300 MG tablet Take 300 mg by mouth daily.     apixaban (ELIQUIS) 5 MG TABS tablet Take 1 tablet (5 mg total) by mouth 2 (two) times daily. 180 tablet 3   carvedilol (COREG) 3.125 MG tablet TAKE 1 TABLET TWICE A DAY 180 tablet 3   celecoxib (CELEBREX) 200 MG capsule Take by mouth 2 (two) times daily.     ENTRESTO 97-103 MG TAKE 1 TABLET TWICE A DAY 180 tablet 3   fluticasone-salmeterol (ADVAIR HFA) 115-21 MCG/ACT inhaler Inhale 2 puffs into the lungs 2 (two) times daily.      gabapentin (NEURONTIN) 100 MG capsule Take 100 mg by mouth 2 (two) times daily.     metFORMIN (GLUCOPHAGE) 500 MG tablet Take 500 mg by mouth daily with breakfast. 2 tablets daily     omeprazole (PRILOSEC) 20 MG capsule Take 20 mg by mouth daily.     oxyCODONE (OXY IR/ROXICODONE) 5 MG immediate release tablet Take 1 tablet (5 mg total) by mouth every 6 (six) hours as needed for severe pain. 120 tablet 0   OXYGEN Inhale into the lungs continuous. 2 liters     PROAIR HFA 108 (90 Base) MCG/ACT inhaler Inhale 1 puff into the lungs every 6 (six) hours as needed.      simvastatin (ZOCOR) 40 MG tablet Take 1 tablet (40 mg total) by mouth daily. 90 tablet 3   SPIRIVA RESPIMAT 2.5 MCG/ACT AERS SMARTSIG:2 Puff(s) Via Inhaler Daily     tamsulosin (FLOMAX) 0.4 MG CAPS capsule Take 0.4 mg by mouth daily.     torsemide (DEMADEX) 20 MG tablet TAKE 1 TABLET DAILY, TAKE 1 EXTRA TABLET AT NOON ON MONDAY, WEDNESDAY, AND FRIDAY 130 tablet 3   No current facility-administered medications for this visit.       I,Gabriella Ballesteros,acting as a scribe for John Kaplan, MD.,have documented all relevant documentation on the behalf of John Kaplan, MD,as directed by  John Kaplan, MD while in the presence of  John Kaplan, MD.

## 2021-11-03 LAB — CEA: CEA: 1.7

## 2021-11-15 ENCOUNTER — Encounter: Payer: Self-pay | Admitting: Oncology

## 2021-11-23 ENCOUNTER — Other Ambulatory Visit: Payer: Self-pay

## 2021-11-23 DIAGNOSIS — G8929 Other chronic pain: Secondary | ICD-10-CM

## 2021-11-23 MED ORDER — OXYCODONE HCL 5 MG PO TABS: 5.0000 mg | ORAL_TABLET | Freq: Four times a day (QID) | ORAL | 0 refills | Status: DC | PRN

## 2021-11-27 ENCOUNTER — Encounter: Payer: Self-pay | Admitting: Hematology and Oncology

## 2021-12-22 ENCOUNTER — Other Ambulatory Visit: Payer: Self-pay

## 2021-12-22 DIAGNOSIS — G8929 Other chronic pain: Secondary | ICD-10-CM

## 2021-12-22 MED ORDER — OXYCODONE HCL 5 MG PO TABS: 5.0000 mg | ORAL_TABLET | Freq: Four times a day (QID) | ORAL | 0 refills | Status: DC | PRN

## 2021-12-28 DIAGNOSIS — Z8379 Family history of other diseases of the digestive system: Secondary | ICD-10-CM | POA: Diagnosis not present

## 2021-12-28 DIAGNOSIS — J0381 Acute recurrent tonsillitis due to other specified organisms: Secondary | ICD-10-CM | POA: Diagnosis not present

## 2021-12-28 DIAGNOSIS — K5190 Ulcerative colitis, unspecified, without complications: Secondary | ICD-10-CM | POA: Diagnosis not present

## 2022-01-16 DIAGNOSIS — E119 Type 2 diabetes mellitus without complications: Secondary | ICD-10-CM | POA: Diagnosis not present

## 2022-01-16 DIAGNOSIS — Z961 Presence of intraocular lens: Secondary | ICD-10-CM | POA: Diagnosis not present

## 2022-01-16 DIAGNOSIS — H532 Diplopia: Secondary | ICD-10-CM | POA: Diagnosis not present

## 2022-01-23 ENCOUNTER — Other Ambulatory Visit: Payer: Self-pay | Admitting: Oncology

## 2022-01-23 ENCOUNTER — Telehealth: Payer: Self-pay

## 2022-01-23 DIAGNOSIS — G8929 Other chronic pain: Secondary | ICD-10-CM

## 2022-01-23 MED ORDER — OXYCODONE HCL 5 MG PO TABS: 5.0000 mg | ORAL_TABLET | Freq: Four times a day (QID) | ORAL | 0 refills | Status: DC | PRN

## 2022-01-23 NOTE — Telephone Encounter (Signed)
Patient aware.

## 2022-01-23 NOTE — Telephone Encounter (Signed)
Need oxycodone refilled at [facility] in [place].

## 2022-02-02 ENCOUNTER — Other Ambulatory Visit: Payer: Self-pay | Admitting: Oncology

## 2022-02-02 ENCOUNTER — Inpatient Hospital Stay (INDEPENDENT_AMBULATORY_CARE_PROVIDER_SITE_OTHER): Payer: Medicare Other | Admitting: Oncology

## 2022-02-02 ENCOUNTER — Inpatient Hospital Stay: Payer: Medicare Other | Attending: Oncology

## 2022-02-02 ENCOUNTER — Encounter: Payer: Self-pay | Admitting: Oncology

## 2022-02-02 VITALS — BP 145/91 | HR 77 | Temp 98.0°F | Resp 19 | Ht 70.0 in | Wt 237.6 lb

## 2022-02-02 DIAGNOSIS — I714 Abdominal aortic aneurysm, without rupture, unspecified: Secondary | ICD-10-CM

## 2022-02-02 DIAGNOSIS — Z452 Encounter for adjustment and management of vascular access device: Secondary | ICD-10-CM | POA: Insufficient documentation

## 2022-02-02 DIAGNOSIS — Z87891 Personal history of nicotine dependence: Secondary | ICD-10-CM | POA: Insufficient documentation

## 2022-02-02 DIAGNOSIS — C3491 Malignant neoplasm of unspecified part of right bronchus or lung: Secondary | ICD-10-CM | POA: Diagnosis not present

## 2022-02-02 DIAGNOSIS — I2699 Other pulmonary embolism without acute cor pulmonale: Secondary | ICD-10-CM

## 2022-02-02 DIAGNOSIS — C3411 Malignant neoplasm of upper lobe, right bronchus or lung: Secondary | ICD-10-CM | POA: Insufficient documentation

## 2022-02-02 LAB — CMP (CANCER CENTER ONLY)
ALT: 21 U/L (ref 0–44)
AST: 24 U/L (ref 15–41)
Albumin: 3.6 g/dL (ref 3.5–5.0)
Alkaline Phosphatase: 146 U/L — ABNORMAL HIGH (ref 38–126)
Anion gap: 8 (ref 5–15)
BUN: 17 mg/dL (ref 8–23)
CO2: 25 mmol/L (ref 22–32)
Calcium: 8.4 mg/dL — ABNORMAL LOW (ref 8.9–10.3)
Chloride: 106 mmol/L (ref 98–111)
Creatinine: 1.18 mg/dL (ref 0.61–1.24)
GFR, Estimated: >60 mL/min (ref >60)
Glucose, Bld: 97 mg/dL (ref 70–99)
Potassium: 3.6 mmol/L (ref 3.5–5.1)
Sodium: 139 mmol/L (ref 135–145)
Total Bilirubin: 0.4 mg/dL (ref 0.3–1.2)
Total Protein: 6.5 g/dL (ref 6.5–8.1)

## 2022-02-02 LAB — CBC WITH DIFFERENTIAL (CANCER CENTER ONLY)
Abs Immature Granulocytes: 0.01 10*3/uL (ref 0.00–0.07)
Basophils Absolute: 0 10*3/uL (ref 0.0–0.1)
Basophils Relative: 1 %
Eosinophils Absolute: 0.2 10*3/uL (ref 0.0–0.5)
Eosinophils Relative: 4 %
HCT: 37.1 % — ABNORMAL LOW (ref 39.0–52.0)
Hemoglobin: 11.7 g/dL — ABNORMAL LOW (ref 13.0–17.0)
Immature Granulocytes: 0 %
Lymphocytes Relative: 22 %
Lymphs Abs: 1.4 10*3/uL (ref 0.7–4.0)
MCH: 28.6 pg (ref 26.0–34.0)
MCHC: 31.5 g/dL (ref 30.0–36.0)
MCV: 90.7 fL (ref 80.0–100.0)
Monocytes Absolute: 0.4 10*3/uL (ref 0.1–1.0)
Monocytes Relative: 6 %
Neutro Abs: 4.2 10*3/uL (ref 1.7–7.7)
Neutrophils Relative %: 67 %
Platelet Count: 185 10*3/uL (ref 150–400)
RBC: 4.09 MIL/uL — ABNORMAL LOW (ref 4.22–5.81)
RDW: 15 % (ref 11.5–15.5)
WBC Count: 6.3 10*3/uL (ref 4.0–10.5)
nRBC: 0 % (ref 0.0–0.2)

## 2022-02-02 MED ORDER — HEPARIN SOD (PORK) LOCK FLUSH 100 UNIT/ML IV SOLN
500.0000 [IU] | Freq: Once | INTRAVENOUS | Status: AC | PRN
  Administered 2022-02-02: 500 [IU]

## 2022-02-02 MED ORDER — SODIUM CHLORIDE 0.9% FLUSH
10.0000 mL | INTRAVENOUS | Status: DC | PRN
  Administered 2022-02-02: 10 mL

## 2022-02-02 NOTE — Progress Notes (Signed)
[name]  [address] [place],  [place]  [id number] [phone]  Clinic Day:  02/02/22   Referring physician: Nicoletta Dress, MD  ASSESSMENT & PLAN:   Assessment & Plan: Non-small cell lung cancer Stage IIIA non-small cell lung cancer with partial response to concurrent chemoradiation.  He completed 1 year of durvalumab in March 2021 and tolerated this without significant difficulty.  CT imaging in April revealed continued improvement.  PET scan from July 2021 revealed a large area of treated tumor/radiation fibrosis involving the right lower lobe with no hypermetabolism to suggest residual or recurrent tumor.  No evidence of metastatic disease was observed.  CT scan from September 2022 revealed unchanged post treatment/post radiation appearance of the right lung, with perihilar fibrosis and consolidation. His scan from May 01, 2021 remains stable.  The most recent scan from October 31, 2021 remains stable with a band of thickening of the pleural-parenchymal right lower lobe but no evidence of lung cancer recurrence or metastasis. It will be 3 years coming up that he has been off treatment as of March, 2024.  Anemia This is largely anemia of chronic disease.  His hemoglobin has come up from 11.3 to 12.5 at this time.   Plan His labs today are pending. I will switch his visits from every 3 months to every 4 months, and see him back in 4 months with CBC, CMP, CEA, and CT of the chest and abdomen. This will also allow [place] to monitor his aortic aneurysms. We will go to scans every 8 months as long as they remain stable. I will continue to refill his pain medication, oxycodone 5mg . I will call I'm with the lab results. He will receive his tests on a Wednesday and I will see him on a Friday after his scans. He is clinically stable. His port was flushed today and they will use it for his CT scan next time..  The patient understands the plans discussed today  and is in agreement with them.  He knows to contact our office if he develops concerns prior to his next appointment.  I provided 15 minutes of face-to-face time during this this encounter and > 50% was spent counseling as documented under my assessment and plan.   Derwood Kaplan, MD  [facility] [address] [place] [address] Dept: [phone] Dept Fax: [phone]   CHIEF COMPLAINT:  CC: Stage IIIA non-small cell lung cancer  Current Treatment: Observation   HISTORY OF PRESENT ILLNESS:  John Maddox is a 82 y.o. male with stage IIIA (T3 N1 M0) non-small cell lung cancer diagnosed in September 2019.  He had presented with severe bradycardia and left chest discomfort and was found to have a 6.2 cm mass in the right upper lobe with 1 paratracheal node measuring 1-2 cm.  Bronchoscopy was done and the 1st set of cytology and pathology results were negative.  Bronchial brushings and washings revealed rare atypical cells suspicious for malignancy in 1 specimen and malignant cells definitely consistent with non-small cell carcinoma of the lung, favoring adenocarcinoma, in a 2nd specimen.  We recommended concurrent chemoradiation with weekly carboplatin/paclitaxel.  He has many comorbidities including moderate emphysema, a distal descending thoracic aortic aneurysm measuring 4.4 cm, hypertension, congestive heart failure, diabetes mellitus, chronic kidney disease, multiple aortic aneurysms, peripheral artery disease, and history of prostate cancer 11 years ago.  His Cardiolite scan revealed mild global hypokinesis  with an ejection fraction of 37%.  He also has pre-existing peripheral neuropathy.  MRI of the head did not reveal any evidence of intracranial metastasis.  He received 7 cycles of chemotherapy, which was completed in December 2019.  Unfortunately, the patient became quite ill and septic later in December 2019.   He was transferred from [facility] to [facility].  He was found to have septic arthritis of his knee and was placed on IV antibiotics at home with daptomycin and ceftriaxone as well as rifampin.  The hemoglobin was 8.0 when he was discharged and continued to drop to 7.3 and then 7.1.  We were eventually able to transfuse him as an inpatient.   When the patient went to see Dr. Luciana Axe of Infectious Disease in February 2020, he collapsed prior to getting his labs drawn and had cardiorespiratory arrest requiring resuscitation.  He was transported to the emergency room and admitted.  He was found to have acute pulmonary embolism of small to moderate size, but no significant right heart strain.  He had deep venous thrombosis of the right femoral vein, right popliteal vein and right posterior tibial vein.  He was placed on heparin IV and transitioned to apixaban 5 mg twice daily, which he continues.  He was also found to have aspiration pneumonia during that hospitalization. A CT chest revealed the spiculated mass in the posterior right upper lobe was slightly smaller measuring 3.8 cm in maximum diameter, decreased from 4.0 cm.  There was stable right hilar adenopathy measuring 1.4 cm.  There was a small right pleural effusion.   He started maintenance durvalumab in March 2020 and tolerated it well.  The patient had a repeat echocardiogram, which showed improvement in his ejection fraction.  CT chest in early June revealed decrease in the size of the right upper lobe mass to 2.4 cm, with decrease in the pleural effusion.  There was a stable thoracic aortic aneurysm measuring 4.7 cm.  When he was seen in June, he reported intermittent dizziness, but MRI brain did not reveal intracranial metastasis or other acute abnormality.  Repeat CT chest in September 2020 revealed improvement in the size of the right upper lobe pulmonary nodule from 2.4 cm to 1.9 cm, with a possible right lower lobe pneumonia.   CT chest in January 2021 revealed slight interval increase in dense, post treatment fibrotic consolidation and volume loss of the perihilar right upper lobe, which is consistent with maturation of radiation fibrosis.  The previously seen clustered nodularity of the right lower lobe was essentially resolved, consistent with resolved atypical infection or inflammation.  Atherosclerotic, tortuous and aneurysmal thoracic aorta, the distal arch measuring approximately 5.0 cm and the tortuous descending thoracic aorta focally aneurysmal and measuring approximately 5.1 cm in greatest caliber orthogonal to flow was re-demonstrated.  I recommended [employer] testing of the peripheral blood, and these results show no reportable alterations with companion diagnostic claims.  Other biomarkers with potential clinical significance, included DNMT3A and TET2.  He completed a total of 1 year of durvalumab therapy on March 10th.  CT chest in April revealed post treatment changes about the right hilum measuring 6.9 x 2.5 cm, previously 7.4 x 3.4 cm.  There were nodular changes in the left lung base developing along the margin of the aorta measuring 1.7 x 1.1 cm, more conspicuous than on the previous exam.  Stable appearance of the thoracic aorta with tortuosity and dilation.  The upper abdominal aortic aneurysm with mural thrombus  is similar in appearance measuring 4.6 cm.  PET imaging from July 2021 revealed a large are of treated tumor/radiation fibrosis involving the right lower lobe.  There was no hypermetabolism to suggest residual or recurrent tumor, no findings for mediastinal/hilar adenopathy or pulmonary metastatic disease.  There was no evidence of abdominal/pelvic metastatic disease or osseous metastatic disease.  Stable, severe vascular disease was seen.  The upper abdominal aorta measures 4.4 cm.  CT from January revealed stable right infrahilar soft tissue at the site of previous treatment.  Decreased size of nodular  focus at the left lung base now measuring 13 x 10 mm as compared to 17 x 10 mm. Likely nodular scarring. Tortuous and aneurysmal appearance of the thoracoabdominal aorta with similar appearance.  CT chest, abdomen and pelvis from September of 2022 revealed unchanged post treatment/post radiation appearance of the right lung, with perihilar fibrosis and consolidation with no evidence of recurrent or metastatic disease. Redemonstrated aneurysmal aortic arch and descending thoracic aorta, maximum caliber of the distal arch approximately 4.6 x 4.2 cm and of the distal descending thoracic aorta approximately 5.2 x 5.2 cm.  INTERVAL HISTORY:  John Maddox is here today for repeat clinical assessment for stage IIIA non-small cell lung cancer. He states that he is still feeling generalized pain. He takes around 3-5 pains pills a day. It will be 3 years coming up that he has been off treatment as of March, 2024. His labs today are pending. I will switch his visit from every 3 months to every 4 months and go to every 8 months on his scans as long as they remain stable.  I will see him back in 4 months with CBC, CMP, CEA, and CT of the chest and abdomen to be done 2 days before. This will also allow [place] to monitor his aortic aneurysms. I will continue to refill his pain medication, oxycodone 5mg . I will call him with the lab results.  He denies signs of infection such as sore throat, sinus drainage, cough, or urinary symptoms.  He denies fevers or recurrent chills. He denies pain. He denies nausea, vomiting, chest pain, dyspnea or cough. His weight has been stable.     REVIEW OF SYSTEMS:  Review of Systems  Constitutional:  Positive for fatigue. Negative for appetite change, chills, diaphoresis, fever and unexpected weight change.  HENT:  Negative.  Negative for hearing loss, lump/mass, mouth sores, nosebleeds, sore throat, tinnitus, trouble swallowing and voice change.   Eyes: Negative.  Negative for eye problems and  icterus.  Respiratory:  Positive for shortness of breath (With exertion). Negative for chest tightness, cough, hemoptysis and wheezing.   Cardiovascular: Negative.  Negative for chest pain, leg swelling and palpitations.  Gastrointestinal: Negative.  Negative for abdominal distention, abdominal pain, blood in stool, constipation, diarrhea, nausea, rectal pain and vomiting.  Genitourinary: Negative.  Negative for bladder incontinence, difficulty urinating, dyspareunia, dysuria, frequency, hematuria, nocturia, pelvic pain and penile discharge.   Musculoskeletal:  Positive for arthralgias (Chronic, stable due to degenerative disease) and gait problem (Due to degenerative disease). Negative for back pain, flank pain, myalgias, neck pain and neck stiffness.  Skin: Negative.  Negative for itching, rash and wound.  Neurological:  Positive for gait problem (Due to degenerative disease). Negative for dizziness, extremity weakness, headaches, light-headedness, numbness, seizures and speech difficulty.  Hematological:  Negative for adenopathy. Does not bruise/bleed easily.  Psychiatric/Behavioral: Negative.  Negative for confusion, decreased concentration, depression, sleep disturbance and suicidal ideas. The patient is not nervous/anxious.  VITALS:  Blood pressure (!) 145/91, pulse 77, temperature 98 F (36.7 C), temperature source Oral, resp. rate 19, height 5\' 10"  (1.778 m), weight 237 lb 9.6 oz (107.8 kg), SpO2 96 %.  Wt Readings from Last 3 Encounters:  02/02/22 237 lb 9.6 oz (107.8 kg)  11/02/21 228 lb 4.8 oz (103.6 kg)  09/21/21 227 lb 6.4 oz (103.1 kg)    Body mass index is 34.09 kg/m.  Performance status (ECOG): 2 - Symptomatic, <50% confined to bed  PHYSICAL EXAM:  Physical Exam Vitals and nursing note reviewed. Exam conducted with a chaperone present.  Constitutional:      General: He is not in acute distress.    Appearance: Normal appearance. He is normal weight. He is not  ill-appearing, toxic-appearing or diaphoretic.  HENT:     Head: Normocephalic and atraumatic.     Right Ear: Tympanic membrane, ear canal and external ear normal. There is no impacted cerumen.     Left Ear: Tympanic membrane, ear canal and external ear normal. There is no impacted cerumen.     Nose: Nose normal. No congestion or rhinorrhea.     Mouth/Throat:     Mouth: Mucous membranes are moist.     Pharynx: Oropharynx is clear. No oropharyngeal exudate or posterior oropharyngeal erythema.  Eyes:     General: No scleral icterus.       Right eye: No discharge.        Left eye: No discharge.     Extraocular Movements: Extraocular movements intact.     Conjunctiva/sclera: Conjunctivae normal.     Pupils: Pupils are equal, round, and reactive to light.  Neck:     Vascular: No carotid bruit.  Cardiovascular:     Rate and Rhythm: Normal rate and regular rhythm.     Pulses: Normal pulses.     Heart sounds: Normal heart sounds. No murmur heard.    No friction rub. No gallop.  Pulmonary:     Effort: Pulmonary effort is normal. No respiratory distress.     Breath sounds: No stridor. No wheezing (faint), rhonchi or rales.  Chest:     Chest wall: No tenderness.  Abdominal:     General: Bowel sounds are normal. There is no distension.     Palpations: Abdomen is soft. There is no hepatomegaly, splenomegaly or mass.     Tenderness: There is no abdominal tenderness. There is no right CVA tenderness, left CVA tenderness, guarding or rebound.     Hernia: No hernia is present.  Musculoskeletal:        General: No swelling, tenderness, deformity or signs of injury. Normal range of motion.     Cervical back: Normal range of motion and neck supple. No rigidity or tenderness.     Right lower leg: Edema (2+) present.     Left lower leg: Edema (2+) present.  Lymphadenopathy:     Cervical: No cervical adenopathy.     Upper Body:     Right upper body: No supraclavicular or axillary adenopathy.      Left upper body: No supraclavicular or axillary adenopathy.     Lower Body: No right inguinal adenopathy. No left inguinal adenopathy.  Skin:    General: Skin is warm and dry.     Coloration: Skin is not jaundiced or pale.     Findings: No bruising, erythema, lesion or rash.  Neurological:     General: No focal deficit present.     Mental Status: He is alert and  oriented to person, place, and time. Mental status is at baseline.     Cranial Nerves: No cranial nerve deficit.     Sensory: No sensory deficit.     Motor: No weakness.     Coordination: Coordination normal.     Gait: Gait normal.     Deep Tendon Reflexes: Reflexes normal.  Psychiatric:        Mood and Affect: Mood normal.        Behavior: Behavior normal.        Thought Content: Thought content normal.        Judgment: Judgment normal.     LABS:      Latest Ref Rng & Units 02/02/2022    2:35 PM 10/31/2021   12:00 AM 08/02/2021   12:00 AM  CBC  WBC 4.0 - 10.5 K/uL 6.3  5.6       5.6     5.9      Hemoglobin 13.0 - 17.0 g/dL 11.7  12.5       12.5     11.3      Hematocrit 39.0 - 52.0 % 37.1  38       38     35      Platelets 150 - 400 K/uL 185  169       169     156         This result is from an external source.   Multiple values from one day are sorted in reverse-chronological order      Latest Ref Rng & Units 02/02/2022    2:35 PM 10/31/2021   12:00 AM 08/02/2021   12:00 AM  CMP  Glucose 70 - 99 mg/dL 97     BUN 8 - 23 mg/dL 17  14       14     21       Creatinine 0.61 - 1.24 mg/dL 1.18  1.0       1.0     1.3      Sodium 135 - 145 mmol/L 139  137       137     141      Potassium 3.5 - 5.1 mmol/L 3.6  4.2       4.2     4.0      Chloride 98 - 111 mmol/L 106  106       106     107      CO2 22 - 32 mmol/L 25  28       28     27       Calcium 8.9 - 10.3 mg/dL 8.4  9.2       9.2     8.8      Total Protein 6.5 - 8.1 g/dL 6.5     Total Bilirubin 0.3 - 1.2 mg/dL 0.4     Alkaline Phos 38 - 126 U/L 146  145        145     143      AST 15 - 41 U/L 24  33       33     21      ALT 0 - 44 U/L 21  27       27     18          This result is from an external source.   Multiple values from one day are sorted in reverse-chronological order   Lab Results  Component Value Date   CEA1 2.1 02/01/2021   CEA 1.7 10/31/2021   CEA 1.7 10/31/2021   /  CEA  Date Value Ref Range Status  10/31/2021 1.7  Final  10/31/2021 1.7  Final  02/01/2021 2.1 0.0 - 4.7 ng/mL Final    Comment:    (NOTE)                             Nonsmokers          <3.9                             Smokers             <5.6 [employer] Electrochemiluminescence Immunoassay (ECLIA) Values obtained with different assay methods or kits cannot be used interchangeably.  Results cannot be interpreted as absolute evidence of the presence or absence of malignant disease. Performed At: [facility] [address] [place], [place] [id number] Jolene Schimke MD GV:[id number]    STUDIES:   EXAM:10/31/21 CT CHEST WITH CONTRAST IMPRESSION: Stable band of pleura-parenchymal thickening in the RIGHT lower lobe at prior lung cancer treatment sitre. No evidence of lung cancer recurrence or metastasis. Stable aneurysmal dilation of the proximal and distal descending thoracic aorta. Coronary artery calcification and Aortic Atherosclerosis. (ICD 10-170.0).   EXAM: 05/01/21 CT CHEST, ABDOMEN, AND PELVIS WITH CONTRAST  IMPRESSION:  1. Stable treatment changes in the right lung without evidence of  local recurrence or metastatic disease.  2. No evidence of metastatic disease or acute findings in the  abdomen or pelvis.  3. Status post aorto iliac stent grafting with stable aneurysmal  dilatation of the descending thoracic and proximal abdominal aorta.  Follow-up as clinically warranted.  4. Coronary and aortic atherosclerosis (ICD10-I70.0). Emphysema  (ICD10-J43.9).    HISTORY:   Past Medical History:  Diagnosis Date   AAA  (abdominal aortic aneurysm) without rupture (HCC) 08/03/2015   AKI (acute kidney injury) (HCC) 01/08/2018   Last Assessment & Plan:  Improved Nephrology consulted Likely 2/2 ATN and CIN in the setting of contrast at the outside hospital on 12/21, NSAIDs at the outside hospital, intra-operative hypotension, and entresto use. Avoid nephrotoxic medications(entresto/diuretic) Renally dose medications Strict I&O Monitor creatinine(1.68 today)     Anemia 01/17/2018   Last Assessment & Plan:  Due to chemotherapy and anemia of chronic disease Iron panel shows anemia of chronic disease Follow up with PCP and oncologist   Arrhythmia    Asthma 09/23/2017   Atherosclerosis of native artery of both lower extremities (HCC) 08/03/2015   Bilateral carotid artery stenosis 08/03/2015   Cardiomyopathy (HCC) 11/18/2017   Chronic anticoagulation 06/05/2018   Chronic combined systolic and diastolic heart failure (HCC) 10/03/2017   Congestive heart failure (HCC) 09/23/2017   COPD (chronic obstructive pulmonary disease) (HCC) 09/23/2017   Diabetes mellitus (HCC) 09/23/2017   Diarrhea 01/14/2018   Last Assessment & Plan:  C diff - negative  GIP - negative Resolved   Diastolic dysfunction 09/23/2017   Duodenal nodule 11/10/2014   Esophageal reflux 09/23/2017   Essential hypertension 11/10/2014   Last Assessment & Plan:  Controlled off of medication Follow up with PCP as an outpatient   Fatigue 09/23/2017   Gout 09/23/2017   Hematuria 01/14/2018   Last Assessment & Plan:  Need UA as outpatient.   History of prostate cancer 09/23/2017   Hyperlipidemia 09/23/2017   Hypertension 09/23/2017  Hypertensive heart disease 09/23/2017   Hypertensive heart disease with heart failure (HCC) 06/06/2018   Hypokalemia 01/14/2018   Last Assessment & Plan:  Replace and follow up with PCP   Hypomagnesemia 01/14/2018   Last Assessment & Plan:  Replace and monitor and follow up with PCP   Hypophosphatemia 01/15/2018   Last Assessment & Plan:  Resolved    Infected prosthetic knee joint, initial encounter (HCC) 03/06/2018   Insulin dependent type 2 diabetes mellitus (HCC) 11/10/2014   Last Assessment & Plan:  Glucose 109-140 Continue ISS Monitor glucose   Left ventricular dysfunction 10/03/2017   Lung cancer (HCC)    Lung mass 10/04/2017   Non-small cell lung cancer (HCC) 10/03/2017   Pulmonary emboli (HCC) 03/06/2018   Pulmonary embolism (HCC)    PVC's (premature ventricular contractions) 09/23/2017   PVD (peripheral vascular disease) (HCC) 11/10/2014   Septic arthritis of knee, left (HCC) 01/04/2018   Added automatically from request for surgery [id number]  Last Assessment & Plan:  Prosthetic knee infection S/p wash out Culture negative  Continue Dapto + Ceftriaxone + Rifampin for 4 weeks(currently day 7) ID was notified today prior to discharge He will need CBC, CMP, ESR, CRP weekly faxed to [phone]([facility] with ID) ID outpatient appointment is on 02/17/2017  Antibiotics administered by port by ho   Thrombocytopenia (HCC) 01/15/2018   Last Assessment & Plan:  Decreased to 78.  Due to chemotherapy Peripheral smear negative for schistocytes LDH 211(within normal limits), haptoglobin 329(elevated), Retic 1.7 B12=714, Folate=20, copper pending    Past Surgical History:  Procedure Laterality Date   ABDOMINAL AORTIC ANEURYSM REPAIR     AORTA - ILIAC ARTERY BYPASS GRAFT     BACK SURGERY     CATARACT EXTRACTION, BILATERAL     CHOLECYSTECTOMY     HEMICOLECTOMY     HERNIA REPAIR     KNEE SURGERY Left    replaced all the hardwear   PROSTATE SURGERY     REPLACEMENT TOTAL KNEE     STENT PLACEMENT VASCULAR ([facility] HX)     TONSILLECTOMY      Family History  Problem Relation Age of Onset   Arthritis Mother    Lung cancer Mother    Hypertension Mother    Arthritis Father    Heart attack Father    Prostate cancer Father    Hypertension Father    Hyperlipidemia Father    Heart disease Father    Alcohol abuse Father    Arthritis Brother    Lung cancer  Brother     Social History:  reports that he quit smoking about 10 years ago. His smoking use included cigarettes. He has a 100.00 pack-year smoking history. He has been exposed to tobacco smoke. He has never used smokeless tobacco. He reports that he does not currently use alcohol. He reports that he does not currently use drugs.The patient is accompanied by his wife today.  Allergies:  Allergies  Allergen Reactions   Lantus [Insulin Glargine]     Stomach pain   Quinolones Other (See Comments)    History of aortic aneurysm status post repair.  Use with caution    Current Medications: Current Outpatient Medications  Medication Sig Dispense Refill   allopurinol (ZYLOPRIM) 300 MG tablet Take 300 mg by mouth daily.     apixaban (ELIQUIS) 5 MG TABS tablet Take 1 tablet (5 mg total) by mouth 2 (two) times daily. 180 tablet 3   carvedilol (COREG) 3.125 MG tablet TAKE 1 TABLET TWICE  A DAY 180 tablet 3   celecoxib (CELEBREX) 200 MG capsule Take by mouth 2 (two) times daily.     ENTRESTO 97-103 MG TAKE 1 TABLET TWICE A DAY 180 tablet 3   fluticasone-salmeterol (ADVAIR HFA) 115-21 MCG/ACT inhaler Inhale 2 puffs into the lungs 2 (two) times daily.      gabapentin (NEURONTIN) 100 MG capsule Take 100 mg by mouth 2 (two) times daily.     metFORMIN (GLUCOPHAGE) 500 MG tablet Take 500 mg by mouth daily with breakfast. 2 tablets daily     omeprazole (PRILOSEC) 20 MG capsule Take 20 mg by mouth daily.     oxyCODONE (OXY IR/ROXICODONE) 5 MG immediate release tablet Take 1 tablet (5 mg total) by mouth every 6 (six) hours as needed for severe pain. 120 tablet 0   OXYGEN Inhale into the lungs continuous. 2 liters     PROAIR HFA 108 (90 Base) MCG/ACT inhaler Inhale 1 puff into the lungs every 6 (six) hours as needed.      simvastatin (ZOCOR) 40 MG tablet Take 1 tablet (40 mg total) by mouth daily. 90 tablet 3   SPIRIVA RESPIMAT 2.5 MCG/ACT AERS SMARTSIG:2 Puff(s) Via Inhaler Daily     tamsulosin (FLOMAX) 0.4  MG CAPS capsule Take 0.4 mg by mouth daily.     torsemide (DEMADEX) 20 MG tablet TAKE 1 TABLET DAILY, TAKE 1 EXTRA TABLET AT NOON ON MONDAY, WEDNESDAY, AND FRIDAY 130 tablet 3   Current Facility-Administered Medications  Medication Dose Route Frequency Provider Last Rate Last Admin   sodium chloride flush (NS) 0.9 % injection 10 mL  10 mL Intracatheter PRN Mosher, Harvin Hazel A, PA-C   10 mL at 02/02/22 1505       I,Jasmine M Lassiter,acting as a scribe for Dellia Beckwith, MD.,have documented all relevant documentation on the behalf of Dellia Beckwith, MD,as directed by  Dellia Beckwith, MD while in the presence of Dellia Beckwith, MD.

## 2022-02-20 DIAGNOSIS — Z85118 Personal history of other malignant neoplasm of bronchus and lung: Secondary | ICD-10-CM | POA: Diagnosis not present

## 2022-02-20 DIAGNOSIS — Z79899 Other long term (current) drug therapy: Secondary | ICD-10-CM | POA: Diagnosis not present

## 2022-02-20 DIAGNOSIS — E1169 Type 2 diabetes mellitus with other specified complication: Secondary | ICD-10-CM | POA: Diagnosis not present

## 2022-02-20 DIAGNOSIS — G620 Drug-induced polyneuropathy: Secondary | ICD-10-CM | POA: Diagnosis not present

## 2022-02-20 DIAGNOSIS — Z86711 Personal history of pulmonary embolism: Secondary | ICD-10-CM | POA: Diagnosis not present

## 2022-02-20 DIAGNOSIS — Z8546 Personal history of malignant neoplasm of prostate: Secondary | ICD-10-CM | POA: Diagnosis not present

## 2022-02-20 DIAGNOSIS — J449 Chronic obstructive pulmonary disease, unspecified: Secondary | ICD-10-CM | POA: Diagnosis not present

## 2022-02-20 DIAGNOSIS — Z139 Encounter for screening, unspecified: Secondary | ICD-10-CM | POA: Diagnosis not present

## 2022-02-20 DIAGNOSIS — M109 Gout, unspecified: Secondary | ICD-10-CM | POA: Diagnosis not present

## 2022-02-20 DIAGNOSIS — T451X5A Adverse effect of antineoplastic and immunosuppressive drugs, initial encounter: Secondary | ICD-10-CM | POA: Diagnosis not present

## 2022-02-20 DIAGNOSIS — E785 Hyperlipidemia, unspecified: Secondary | ICD-10-CM | POA: Diagnosis not present

## 2022-02-20 DIAGNOSIS — I110 Hypertensive heart disease with heart failure: Secondary | ICD-10-CM | POA: Diagnosis not present

## 2022-02-20 DIAGNOSIS — K219 Gastro-esophageal reflux disease without esophagitis: Secondary | ICD-10-CM | POA: Diagnosis not present

## 2022-02-21 ENCOUNTER — Encounter: Payer: Self-pay | Admitting: Hematology and Oncology

## 2022-02-23 ENCOUNTER — Other Ambulatory Visit: Payer: Self-pay

## 2022-02-23 DIAGNOSIS — G8929 Other chronic pain: Secondary | ICD-10-CM

## 2022-02-23 MED ORDER — OXYCODONE HCL 5 MG PO TABS: 5.0000 mg | ORAL_TABLET | Freq: Four times a day (QID) | ORAL | 0 refills | Status: DC | PRN

## 2022-02-28 ENCOUNTER — Other Ambulatory Visit: Payer: Self-pay | Admitting: Cardiology

## 2022-03-23 ENCOUNTER — Ambulatory Visit: Payer: Medicare Other | Admitting: Cardiology

## 2022-03-26 ENCOUNTER — Telehealth: Payer: Self-pay

## 2022-03-26 ENCOUNTER — Other Ambulatory Visit: Payer: Self-pay | Admitting: Oncology

## 2022-03-26 DIAGNOSIS — G8929 Other chronic pain: Secondary | ICD-10-CM

## 2022-03-26 MED ORDER — OXYCODONE HCL 5 MG PO TABS: 5.0000 mg | ORAL_TABLET | Freq: Four times a day (QID) | ORAL | 0 refills | Status: DC | PRN

## 2022-03-26 NOTE — Telephone Encounter (Signed)
Patient needs oxy refill at [facility] in [place] thanks.

## 2022-03-27 ENCOUNTER — Encounter: Payer: Self-pay | Admitting: Hematology and Oncology

## 2022-03-27 NOTE — Telephone Encounter (Signed)
Patient notified and voiced understanding.

## 2022-04-25 ENCOUNTER — Other Ambulatory Visit: Payer: Self-pay | Admitting: Hematology and Oncology

## 2022-04-25 DIAGNOSIS — G8929 Other chronic pain: Secondary | ICD-10-CM

## 2022-04-25 MED ORDER — OXYCODONE HCL 5 MG PO TABS: 5.0000 mg | ORAL_TABLET | Freq: Four times a day (QID) | ORAL | 0 refills | Status: DC | PRN

## 2022-05-13 NOTE — Progress Notes (Signed)
Cardiology Office Note:    Date:  05/15/2022   ID:  John Maddox. John Maddox, John Maddox 1940-11-22, MRN [id number]  PCP:  Paulina Fusi, MD  Cardiologist:  Norman Herrlich, MD    Referring MD: Paulina Fusi, MD    ASSESSMENT:    1. Hypertensive heart disease with heart failure (HCC)   2. Chronic anticoagulation   3. Aneurysm of descending thoracic aorta without rupture (HCC)   4. Abdominal aortic aneurysm (AAA) without rupture, unspecified part (HCC)   5. Mixed hyperlipidemia   6. Non-small cell lung cancer, unspecified laterality (HCC)    PLAN:    In order of problems listed above:  Overall stable he is on a good medical regimen including high-dose Entresto and his loop diuretic.  He has mild degree of edema I think more related to gabapentin and decompensated heart failure Continue his anticoagulant with previous monitoring embolism He has follow-up imaging pending in January 2025.  Previous comorbidities would likely make him a prohibitive candidate for elective interventions Continue his statin Quite stable followed by oncology   Next appointment: 6 months   Medication Adjustments/Labs and Tests Ordered: Current medicines are reviewed at length with the patient today.  Concerns regarding medicines are outlined above.  Orders Placed This Encounter  Procedures   EKG 12-Lead   No orders of the defined types were placed in this encounter.   Chief Complaint  Patient presents with   Follow-up   Congestive Heart Failure    History of Present Illness:    John Graul. Maddox is a 82 y.o. male with a hx of hypertensive heart disease with heart failure chronic anticoagulation thoracic aortic aneurysm abdominal aortic aneurysm hyperlipidemia and lung cancer.  He was last seen 09/21/2021.  Initially his echocardiogram showedmildly reduced ejection fraction subsequently normalized venous thromboembolism on long-term anticoagulation with non-small cell lung cancer hypertensive  heart disease hyperlipidemia previous PEA arrest in the setting of pulmonary embolism severe COPD distal thoracic aortic aneurysm 5 cm and stage II CKD.CT of the chest 10/31/2021 showed stable enlargement of the proximal descending thoracic aorta 41 mm distal descending thoracic aorta 52 mm.CT of the abdomen in April 2023 showed aneurysm following aorto by iliac stent graft repair shows stable dilation of the aorta 48 x 43 mm.  He has repeat CT chest and abdomen scheduled 02/02/2022.  Recent labs 3 months ago showed hemoglobin 11.7 platelets 185,000 sodium 139 potassium 3.6 creatinine 1.18 GFR greater than 60 cc/min  Compliance with diet, lifestyle and medications: Yes  He is seen along with his wife and best described as stable continues to use ambulatory oxygen follows up with oncology He has episodes where he feels lightheaded and of asked his wife to begin checking his blood pressure with and without pulse episodes He has a mild degree of edema no orthopnea chest pain or syncope Remains anticoagulated Past Medical History:  Diagnosis Date   AAA (abdominal aortic aneurysm) without rupture (HCC) 08/03/2015   AKI (acute kidney injury) (HCC) 01/08/2018   Last Assessment & Plan:  Improved Nephrology consulted Likely 2/2 ATN and CIN in the setting of contrast at the outside hospital on 12/21, NSAIDs at the outside hospital, intra-operative hypotension, and entresto use. Avoid nephrotoxic medications(entresto/diuretic) Renally dose medications Strict I&O Monitor creatinine(1.68 today)     Anemia 01/17/2018   Last Assessment & Plan:  Due to chemotherapy and anemia of chronic disease Iron panel shows anemia of chronic disease Follow up with PCP and oncologist   Arrhythmia  Asthma 09/23/2017   Atherosclerosis of native artery of both lower extremities (HCC) 08/03/2015   Bilateral carotid artery stenosis 08/03/2015   Cardiomyopathy (HCC) 11/18/2017   Chronic anticoagulation 06/05/2018   Chronic combined  systolic and diastolic heart failure (HCC) 10/03/2017   Congestive heart failure (HCC) 09/23/2017   COPD (chronic obstructive pulmonary disease) (HCC) 09/23/2017   Diabetes mellitus (HCC) 09/23/2017   Diarrhea 01/14/2018   Last Assessment & Plan:  C diff - negative  GIP - negative Resolved   Diastolic dysfunction 09/23/2017   Duodenal nodule 11/10/2014   Esophageal reflux 09/23/2017   Essential hypertension 11/10/2014   Last Assessment & Plan:  Controlled off of medication Follow up with PCP as an outpatient   Fatigue 09/23/2017   Gout 09/23/2017   Hematuria 01/14/2018   Last Assessment & Plan:  Need UA as outpatient.   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/06/2018   Insulin dependent type 2 diabetes mellitus (HCC) 11/10/2014   Last Assessment & Plan:  Glucose 109-140 Continue ISS Monitor glucose   Left ventricular dysfunction 10/03/2017   Lung cancer (HCC)    Lung mass 10/04/2017   Non-small cell lung cancer (HCC) 10/03/2017   Pulmonary emboli (HCC) 03/06/2018   Pulmonary embolism (HCC)    PVC's (premature ventricular contractions) 09/23/2017   PVD (peripheral vascular disease) (HCC) 11/10/2014   Septic arthritis of knee, left (HCC) 01/04/2018   Added automatically from request for surgery [id number]  Last Assessment & Plan:  Prosthetic knee infection S/p wash out Culture negative  Continue Dapto + Ceftriaxone + Rifampin for 4 weeks(currently day 7) ID was notified today prior to discharge He will need CBC, CMP, ESR, CRP weekly faxed to [phone]([facility]  with ID) ID outpatient appointment is on 02/17/2017  Antibiotics administered by port by ho   Thrombocytopenia (HCC) 01/15/2018   Last Assessment & Plan:  Decreased to 78.  Due to chemotherapy Peripheral smear negative for schistocytes LDH 211(within normal limits), haptoglobin 329(elevated), Retic 1.7 B12=714, Folate=20, copper pending    Past Surgical History:  Procedure Laterality Date   ABDOMINAL AORTIC ANEURYSM REPAIR     AORTA - ILIAC ARTERY BYPASS GRAFT     BACK SURGERY     CATARACT EXTRACTION, BILATERAL     CHOLECYSTECTOMY     HEMICOLECTOMY     HERNIA REPAIR     KNEE SURGERY Left    replaced all the hardwear   PROSTATE SURGERY     REPLACEMENT TOTAL KNEE     STENT PLACEMENT VASCULAR ([facility] HX)     TONSILLECTOMY      Current Medications: Current Meds  Medication Sig   allopurinol (ZYLOPRIM) 300 MG tablet Take 300 mg by mouth daily.   apixaban (ELIQUIS) 5 MG TABS tablet Take 1 tablet (5 mg total) by mouth 2 (two) times daily.   carvedilol (COREG) 3.125 MG tablet TAKE 1 TABLET TWICE A DAY   celecoxib (CELEBREX) 200 MG capsule Take by mouth 2 (two) times daily.   fluticasone-salmeterol (ADVAIR HFA) 115-21 MCG/ACT inhaler Inhale 2  puffs into the lungs 2 (two) times daily.    gabapentin (NEURONTIN) 100 MG capsule Take 100 mg by mouth 2 (two) times daily.   metFORMIN (GLUCOPHAGE) 500 MG tablet Take 500 mg by mouth daily with breakfast. 2 tablets daily   omeprazole (PRILOSEC) 20 MG capsule Take 20 mg by mouth daily.   oxyCODONE (OXY IR/ROXICODONE) 5 MG immediate release tablet Take 1 tablet (5 mg total) by mouth every 6 (six) hours as needed for severe pain.   OXYGEN Inhale into the lungs continuous. 2 liters   PROAIR HFA 108 (90 Base) MCG/ACT inhaler Inhale 1 puff into the lungs every 6 (six) hours as needed.    sacubitril-valsartan (ENTRESTO) 97-103 MG Take 1 tablet by mouth 2 (two) times daily.   simvastatin (ZOCOR) 40 MG tablet Take 1 tablet (40 mg total) by mouth daily.   SPIRIVA  RESPIMAT 2.5 MCG/ACT AERS SMARTSIG:2 Puff(s) Via Inhaler Daily   tamsulosin (FLOMAX) 0.4 MG CAPS capsule Take 0.4 mg by mouth daily.   torsemide (DEMADEX) 20 MG tablet TAKE 1 TABLET DAILY, TAKE 1 EXTRA TABLET AT NOON ON MONDAY, WEDNESDAY, AND FRIDAY (Patient taking differently: Take 20 mg by mouth. Takes 2 tablets  (40 mg) daily)     Allergies:   Insulin glargine and Quinolones   Social History   Socioeconomic History   Marital status: Married    Spouse name: Not on file   Number of children: Not on file   Years of education: Not on file   Highest education level: Not on file  Occupational History   Not on file  Tobacco Use   Smoking status: Former    Packs/day: 2.50    Years: 40.00    Additional pack years: 0.00    Total pack years: 100.00    Types: Cigarettes    Quit date: 2014    Years since quitting: 10.3    Passive exposure: Past   Smokeless tobacco: Never  Vaping Use   Vaping Use: Never used  Substance and Sexual Activity   Alcohol use: Not Currently   Drug use: Not Currently   Sexual activity: Not on file  Other Topics Concern   Not on file  Social History Narrative   Not on file   Social Determinants of Health   Financial Resource Strain: Not on file  Food Insecurity: Not on file  Transportation Needs: Not on file  Physical Activity: Not on file  Stress: Not on file  Social Connections: Not on file     Family History: The patient's family history includes Alcohol abuse in his father; Arthritis in his brother, father, and mother; Heart attack in his father; Heart disease in his father; Hyperlipidemia in his father; Hypertension in his father and mother; Lung cancer in his brother and mother; Prostate cancer in his father. ROS:   Please see the history of present illness.    All other systems reviewed and are negative.  EKGs/Labs/Other Studies Reviewed:    The following studies were reviewed today:  Cardiac Studies & Procedures     STRESS  TESTS  MYOCARDIAL PERFUSION IMAGING 10/18/2017   ECHOCARDIOGRAM  ECHOCARDIOGRAM COMPLETE 10/04/2021  Narrative ECHOCARDIOGRAM REPORT    Patient Name:   Thurmond Butts Date of Exam: 10/04/2021 Medical Rec #:  [id number]           Height:       70.0 in Accession #:    [id number]          Weight:  227.4 lb Date of Birth:  05-22-1940           BSA:          2.204 m Patient Age:    81 years            BP:           110/70 mmHg Patient Gender: M                   HR:           72 bpm. Exam Location:  [facility]  Procedure: 2D Echo, Cardiac Doppler, Color Doppler and Strain Analysis  Indications:    Hypertensive heart disease with heart failure (HCC) [I11.0 (ICD-10-CM)]; Chronic anticoagulation [Z79.01 (ICD-10-CM)]; Aneurysm of descending thoracic aorta without rupture (HCC) [N82.95 (ICD-10-CM)]; Abdominal aortic aneurysm (AAA) without rupture, unspecified part (HCC) [I71.40 (ICD-10-CM)]; Mixed hyperlipidemia [E78.2 (ICD-10-CM)]  History:        Patient has prior history of Echocardiogram examinations, most recent 04/21/2020. CHF and Cardiomyopathy, CAD, Arrythmias:PVC; Risk Factors:Hypertension and Dyslipidemia. AAA.  Sonographer:    Margreta Journey RDCS Referring Phys: [id number] Deandrea Vanpelt [facility]   Sonographer Comments: Suboptimal parasternal window and suboptimal subcostal window. IMPRESSIONS   1. TDS. Left ventricular ejection fraction, by estimation, is 55 to 60%. The left ventricle has normal function. Left ventricular endocardial border not optimally defined to evaluate regional wall motion. There is mild left ventricular hypertrophy. Left ventricular diastolic parameters are consistent with Grade I diastolic dysfunction (impaired relaxation). 2. Right ventricular systolic function is normal. The right ventricular size is normal. 3. The mitral valve is normal in structure. No evidence of mitral valve regurgitation. No evidence of mitral stenosis. 4. The aortic valve is  calcified. There is mild calcification of the aortic valve. There is mild thickening of the aortic valve. Aortic valve regurgitation is trivial. No aortic stenosis is present. 5. The inferior vena cava is normal in size with greater than 50% respiratory variability, suggesting right atrial pressure of 3 mmHg.  FINDINGS Left Ventricle: TDS. Left ventricular ejection fraction, by estimation, is 55 to 60%. The left ventricle has normal function. Left ventricular endocardial border not optimally defined to evaluate regional wall motion. The left ventricular internal cavity size was normal in size. There is mild left ventricular hypertrophy. Left ventricular diastolic parameters are consistent with Grade I diastolic dysfunction (impaired relaxation).  Right Ventricle: The right ventricular size is normal. No increase in right ventricular wall thickness. Right ventricular systolic function is normal.  Left Atrium: Left atrial size was normal in size.  Right Atrium: Right atrial size was normal in size.  Pericardium: There is no evidence of pericardial effusion.  Mitral Valve: The mitral valve is normal in structure. No evidence of mitral valve regurgitation. No evidence of mitral valve stenosis.  Tricuspid Valve: The tricuspid valve is normal in structure. Tricuspid valve regurgitation is not demonstrated. No evidence of tricuspid stenosis.  Aortic Valve: The aortic valve is calcified. There is mild calcification of the aortic valve. There is mild thickening of the aortic valve. Aortic valve regurgitation is trivial. Aortic regurgitation PHT measures 503 msec. No aortic stenosis is present.  Pulmonic Valve: The pulmonic valve was normal in structure. Pulmonic valve regurgitation is mild. No evidence of pulmonic stenosis.  Aorta: The aortic root is normal in size and structure.  Venous: The inferior vena cava is normal in size with greater than 50% respiratory variability, suggesting right atrial  pressure of 3 mmHg.  IAS/Shunts: No atrial  level shunt detected by color flow Doppler.   LEFT VENTRICLE PLAX 2D LVIDd:         4.40 cm   Diastology LVIDs:         3.50 cm   LV e' medial:    8.70 cm/s LV PW:         1.30 cm   LV E/e' medial:  5.5 LV IVS:        1.30 cm   LV e' lateral:   8.49 cm/s LVOT diam:     2.40 cm   LV E/e' lateral: 5.7 LV SV:         67 LV SV Index:   30 LVOT Area:     4.52 cm   RIGHT VENTRICLE RV Basal diam:  3.00 cm RV S prime:     10.30 cm/s TAPSE (M-mode): 2.6 cm  LEFT ATRIUM             Index        RIGHT ATRIUM           Index LA diam:        2.80 cm 1.27 cm/m   RA Area:     12.40 cm LA Vol (A2C):   51.8 ml 23.50 ml/m  RA Volume:   23.60 ml  10.71 ml/m LA Vol (A4C):   34.0 ml 15.42 ml/m LA Biplane Vol: 43.1 ml 19.55 ml/m AORTIC VALVE             PULMONIC VALVE LVOT Vmax:   74.60 cm/s  PR End Diast Vel: 4.33 msec LVOT Vmean:  54.600 cm/s LVOT VTI:    0.148 m AI PHT:      503 msec  AORTA Ao Root diam: 3.50 cm Ao Asc diam:  3.70 cm  MITRAL VALVE MV Area (PHT): 2.80 cm    SHUNTS MV Decel Time: 271 msec    Systemic VTI:  0.15 m MV E velocity: 48.00 cm/s  Systemic Diam: 2.40 cm MV A velocity: 82.50 cm/s MV E/A ratio:  0.58  Gypsy Balsam MD Electronically signed by Gypsy Balsam MD Signature Date/Time: 10/04/2021/5:23:51 PM    Final    MONITORS  LONG TERM MONITOR (3-14 DAYS) 11/01/2017  Narrative A Zio monitor was employed for 14 days beginning 10/11/2017 to assess for ventricular arrhythmia and bradycardia in the setting of heart failure. The rhythm throughout is sinus with minimum average and maximum heart rates of 60, 90 and 131 bpm.  There were no bradycardic events, no episodes of sinus node or AV block and no pauses of 3 seconds or greater.  Ventricular arrhythmia rare less than 1% isolated PVCs  Supraventricular arrhythmia frequent, ectopic burden 10.5%.  There were 191,784 APCs 455 couplets 55 triplets and 12  runs of APCs longest 12 seconds in duration rates 140 bpm rhythm atrial tachycardia.  There were no episodes of atrial fibrillation or flutter.  There was one triggered event with frequent PVCs  Conclusion, frequent supraventricular ectopy with brief runs of APCs longest 12 seconds rate 140 bpm atrial tachycardia.  Rare PVCs were present in the one symptom triggered episode was frequent PVCs.  No significant bradycardia is noted.           EKG:  EKG ordered today and personally reviewed.  The ekg ordered today demonstrates sinus rhythm QS V1 to V3 consider anteroseptal MI  Recent Labs: 02/02/2022: ALT 21; BUN 17; Creatinine 1.18; Hemoglobin 11.7; Platelet Count 185; Potassium 3.6; Sodium 139  Recent Lipid Panel  No results found for: "CHOL", "TRIG", "HDL", "CHOLHDL", "VLDL", "LDLCALC", "LDLDIRECT"  Physical Exam:    VS:  BP 108/68 (BP Location: Left Arm, Patient Position: Sitting, Cuff Size: Normal)   Pulse 74   Ht 5\' 10"  (1.778 m)   Wt 230 lb (104.3 kg)   SpO2 98% Comment: 2 LITERS [place]  BMI 33.00 kg/m     Wt Readings from Last 3 Encounters:  05/15/22 230 lb (104.3 kg)  02/02/22 237 lb 9.6 oz (107.8 kg)  11/02/21 228 lb 4.8 oz (103.6 kg)     GEN: Looks chronically ill and debilitated wearing nasal oxygen well nourished, well developed in no acute distress HEENT: Normal NECK: No JVD; No carotid bruits LYMPHATICS: No lymphadenopathy CARDIAC: Distant heart sounds RRR, no murmurs, rubs, gallops RESPIRATORY: Diminished breath sounds ABDOMEN: Soft, non-tender, non-distended MUSCULOSKELETAL: Mild bilateral lower extremity pitting edema; No deformity  SKIN: Warm and dry NEUROLOGIC:  Alert and oriented x 3 PSYCHIATRIC:  Normal affect    Signed, Norman Herrlich, MD  05/15/2022 2:45 PM    [facility]

## 2022-05-15 ENCOUNTER — Encounter: Payer: Self-pay | Admitting: Cardiology

## 2022-05-15 ENCOUNTER — Ambulatory Visit: Payer: Medicare Other | Attending: Cardiology | Admitting: Cardiology

## 2022-05-15 VITALS — BP 108/68 | HR 74 | Ht 70.0 in | Wt 230.0 lb

## 2022-05-15 DIAGNOSIS — I7123 Aneurysm of the descending thoracic aorta, without rupture: Secondary | ICD-10-CM | POA: Insufficient documentation

## 2022-05-15 DIAGNOSIS — Z7901 Long term (current) use of anticoagulants: Secondary | ICD-10-CM

## 2022-05-15 DIAGNOSIS — E782 Mixed hyperlipidemia: Secondary | ICD-10-CM | POA: Insufficient documentation

## 2022-05-15 DIAGNOSIS — I714 Abdominal aortic aneurysm, without rupture, unspecified: Secondary | ICD-10-CM | POA: Insufficient documentation

## 2022-05-15 DIAGNOSIS — I110 Hypertensive heart disease with heart failure: Secondary | ICD-10-CM | POA: Diagnosis not present

## 2022-05-15 DIAGNOSIS — C3490 Malignant neoplasm of unspecified part of unspecified bronchus or lung: Secondary | ICD-10-CM | POA: Diagnosis not present

## 2022-05-15 NOTE — Patient Instructions (Signed)
Medication Instructions:  Your physician recommends that you continue on your current medications as directed. Please refer to the Current Medication list given to you today.  *If you need a refill on your cardiac medications before your next appointment, please call your pharmacy*   Lab Work: None If you have labs (blood work) drawn today and your tests are completely normal, you will receive your results only by: MyChart Message (if you have MyChart) OR A paper copy in the mail If you have any lab test that is abnormal or we need to change your treatment, we will call you to review the results.   Testing/Procedures: None   Follow-Up: At [facility], you and your health needs are our priority.  As part of our continuing mission to provide you with exceptional heart care, we have created designated Provider Care Teams.  These Care Teams include your primary Cardiologist (physician) and Advanced Practice Providers (APPs -  Physician Assistants and Nurse Practitioners) who all work together to provide you with the care you need, when you need it.  We recommend signing up for the patient portal called "MyChart".  Sign up information is provided on this After Visit Summary.  MyChart is used to connect with patients for Virtual Visits (Telemedicine).  Patients are able to view lab/test results, encounter notes, upcoming appointments, etc.  Non-urgent messages can be sent to your provider as well.   To learn more about what you can do with MyChart, go to [email or web address].    Your next appointment:   6 month(s)  Provider:   Brian Munley, MD    Other Instructions None  

## 2022-05-25 ENCOUNTER — Other Ambulatory Visit: Payer: Self-pay

## 2022-05-25 DIAGNOSIS — G8929 Other chronic pain: Secondary | ICD-10-CM

## 2022-05-25 MED ORDER — OXYCODONE HCL 5 MG PO TABS: 5.0000 mg | ORAL_TABLET | Freq: Four times a day (QID) | ORAL | 0 refills | Status: DC | PRN

## 2022-05-30 DIAGNOSIS — J439 Emphysema, unspecified: Secondary | ICD-10-CM | POA: Diagnosis not present

## 2022-05-30 DIAGNOSIS — N200 Calculus of kidney: Secondary | ICD-10-CM | POA: Diagnosis not present

## 2022-05-30 DIAGNOSIS — C3491 Malignant neoplasm of unspecified part of right bronchus or lung: Secondary | ICD-10-CM | POA: Diagnosis not present

## 2022-05-30 DIAGNOSIS — I700 Atherosclerosis of aorta: Secondary | ICD-10-CM | POA: Diagnosis not present

## 2022-05-30 DIAGNOSIS — I714 Abdominal aortic aneurysm, without rupture, unspecified: Secondary | ICD-10-CM | POA: Diagnosis not present

## 2022-05-30 DIAGNOSIS — C3490 Malignant neoplasm of unspecified part of unspecified bronchus or lung: Secondary | ICD-10-CM | POA: Diagnosis not present

## 2022-05-30 DIAGNOSIS — J432 Centrilobular emphysema: Secondary | ICD-10-CM | POA: Diagnosis not present

## 2022-05-30 DIAGNOSIS — K429 Umbilical hernia without obstruction or gangrene: Secondary | ICD-10-CM | POA: Diagnosis not present

## 2022-05-30 LAB — BASIC METABOLIC PANEL
BUN: 19 (ref 4–21)
CO2: 28 — AB (ref 13–22)
Chloride: 103 (ref 99–108)
Creatinine: 1.1 (ref 0.6–1.3)
EGFR: 60
Glucose: 82
Potassium: 4.5 mEq/L (ref 3.5–5.1)
Sodium: 137 (ref 137–147)

## 2022-05-30 LAB — COMPREHENSIVE METABOLIC PANEL
Albumin: 3.7 (ref 3.5–5.0)
Calcium: 8.8 (ref 8.7–10.7)

## 2022-05-30 LAB — CBC: RBC: 3.99 (ref 3.87–5.11)

## 2022-05-30 LAB — CBC AND DIFFERENTIAL
HCT: 36 — AB (ref 41–53)
Hemoglobin: 11.7 — AB (ref 13.5–17.5)
Neutrophils Absolute: 4.22
Platelets: 161 10*3/uL (ref 150–400)
WBC: 6.3

## 2022-05-30 LAB — HEPATIC FUNCTION PANEL
ALT: 22 U/L (ref 10–40)
AST: 28 (ref 14–40)
Alkaline Phosphatase: 168 — AB (ref 25–125)
Bilirubin, Total: 0.5

## 2022-05-31 NOTE — Progress Notes (Signed)
[facility]  [address] [place],  [place]  [address] [phone]  Clinic Day:  06/01/22  Referring physician: Paulina Fusi, MD  ASSESSMENT & PLAN:  Assessment & Plan: Non-small cell lung cancer Stage IIIA non-small cell lung cancer with good response to concurrent chemoradiation.  He completed 1 year of durvalumab in March 2021 and tolerated this without significant difficulty.  CT imaging in April revealed continued improvement.  PET scan from July 2021 revealed a large area of treated tumor/radiation fibrosis involving the right lower lobe with no hypermetabolism to suggest residual or recurrent tumor.  No evidence of metastatic disease was observed.  CT scan from September 2022 revealed unchanged post treatment/post radiation appearance of the right lung, with perihilar fibrosis and consolidation. His scan from May 01, 2021 remains stable.  The most recent scan from 05/30/2022 remains stable with a band of thickening of the pleural-parenchymal right lower lobe but no evidence of lung cancer recurrence or metastasis. It is over 3 years that he has been off treatment as of March, 2024.   Thoracic Aortic Aneurysm This is located in the descending thoracic aorta measuring 5.7cm. The last measurement from October, 2023 was 5.2. However, I don't think this has really changed since the measurement from 1 year ago was 5.7cm.   Abdominal Aortic Aneurysm This is a suprarenal location and measures 4.6cm currently as compared to 4.8cm one year ago, so it remains stable.  Anemia This is largely anemia of chronic disease.  His hemoglobin is stable at 11.7.    Plan He finished his treatment in March, 2021, so he is over 3 years off treatment now. He had a CT chest, abdomen, and pelvis on 05/30/2022 that showed no evidence of recurrent or metastatic carcinoma within the chest or abdomen, stable 5.7 cm distal descending thoracic aortic aneurysm, a stable 4.6  cm suprarenal abdominal aortic aneurysm, a small umbilical hernia which is stable and a tiny non-obstructing right renal calculus. His port was flushed during his scan. His WBC is 6.3, hemoglobin stable 11.7, and platelet count is 161,000 today. His CMP and CEA are normal. I will see him back in 6 months with CBC, CMP, CEA, and CT of chest, abdomen, and pelvis.  The patient and his wife understand the plans discussed today and are in agreement with them.  He knows to contact our office if he develops concerns prior to his next appointment.  I provided 25 minutes of face-to-face time during this this encounter and > 50% was spent counseling as documented under my assessment and plan.   Dellia Beckwith, MD  [facility] [address] [place] [address] Dept: [phone] Dept Fax: [phone]   CHIEF COMPLAINT:  CC: Stage IIIA non-small cell lung cancer  Current Treatment: Observation   HISTORY OF PRESENT ILLNESS:  John Maddox is a 82 y.o. male with stage IIIA (T3 N1 M0) non-small cell lung cancer diagnosed in September 2019.  He had presented with severe bradycardia and left chest discomfort and was found to have a 6.2 cm mass in the right upper lobe with 1 paratracheal node measuring 1-2 cm.  Bronchoscopy was done and the 1st set of cytology and pathology results were negative.  Bronchial brushings and washings revealed malignant cells definitely consistent with non-small cell carcinoma of the lung, favoring adenocarcinoma, in a 2nd specimen.  We recommended concurrent chemoradiation with weekly carboplatin/paclitaxel.  He had  many comorbidities including moderate emphysema, a distal descending thoracic aortic aneurysm measuring 4.4 cm, hypertension, congestive heart failure, diabetes mellitus, chronic kidney disease, multiple aortic aneurysms, peripheral artery disease, and history of prostate cancer 11 years ago.  His  Cardiolite scan revealed mild global hypokinesis with an ejection fraction of 37%.  He also had pre-existing peripheral neuropathy.  MRI of the head did not reveal any evidence of intracranial metastasis.  He received 7 cycles of chemotherapy, which was completed in December 2019.  Unfortunately, the patient became quite ill and septic later in December 2019.  He was transferred from [facility] to [facility].  He was found to have septic arthritis of his knee and was placed on IV antibiotics at home with daptomycin and ceftriaxone as well as rifampin.  The hemoglobin was 8.0 and dropped as low as 7.1. When the patient went to see Dr. Luciana Axe of Infectious Disease in February 2020, he collapsed prior to getting his labs drawn and had a cardiorespiratory arrest requiring resuscitation. He was found to have acute pulmonary embolism of small to moderate size, but no significant right heart strain.  He had deep venous thrombosis of the right femoral vein, right popliteal vein and right posterior tibial vein.  He was placed on heparin IV and transitioned to apixaban 5 mg twice daily, which he continues.  He was also found to have aspiration pneumonia during that hospitalization. A CT chest revealed the spiculated mass in the posterior right upper lobe was slightly smaller measuring 3.8 cm in maximum diameter, decreased from 4.0 cm.  There was stable right hilar adenopathy measuring 1.4 cm.  There was a small right pleural effusion.   He started maintenance durvalumab in March 2020 and tolerated it well.  The patient had a repeat echocardiogram, which showed improvement in his ejection fraction.  CT chest in early June revealed decrease in the size of the right upper lobe mass to 2.4 cm, with decrease in the pleural effusion.  There was a stable thoracic aortic aneurysm measuring 4.7 cm.  When he was seen in June, MRI brain did not reveal intracranial metastasis or other acute abnormality.  Repeat CT  chest in September 2020 revealed improvement in the size of the right upper lobe pulmonary nodule from 2.4 cm to 1.9 cm, with a possible right lower lobe pneumonia.  CT chest in January 2021 revealed slight interval increase in dense, post treatment fibrotic consolidation and volume loss of the perihilar right upper lobe, which is consistent with maturation of radiation fibrosis.  The previously seen clustered nodularity of the right lower lobe was essentially resolved. Foundation One testing of the peripheral blood show no reportable alterations with companion diagnostic claims.  Other biomarkers with potential clinical significance, included DNMT3A and TET2.  He completed a total of 1 year of durvalumab therapy on March 10th.  CT chest in April revealed post treatment changes about the right hilum measuring 6.9 x 2.5 cm, previously 7.4 x 3.4 cm.  There were nodular changes in the left lung base developing along the margin of the aorta measuring 1.7 x 1.1 cm, more conspicuous than on the previous exam. The upper abdominal aortic aneurysm with mural thrombus is similar in appearance measuring 4.6 cm.  PET imaging from July 2021 revealed a large are of treated tumor/radiation fibrosis involving the right lower lobe.  There was no hypermetabolism to suggest residual or recurrent tumor, no findings for mediastinal/hilar adenopathy or pulmonary metastatic disease.  There was  no evidence of abdominal/pelvic metastatic disease or osseous metastatic disease.  Stable, severe vascular disease was seen.  The upper abdominal aorta measures 4.4 cm.  CT from January of 2022 revealed stable right infrahilar soft tissue at the site of previous treatment, and decreased size of nodular focus at the left lung base now measuring 13 x 10 mm as compared to 17 x 10 mm, likely nodular scarring. Tortuous and aneurysmal appearance of the thoracoabdominal aorta with similar appearance.  CT chest, abdomen and pelvis from September of 2022  revealed unchanged post treatment/post radiation appearance of the right lung, with perihilar fibrosis and consolidation with no evidence of recurrent or metastatic disease. Redemonstrated aneurysmal aortic arch and descending thoracic aorta, maximum caliber of the distal arch approximately 4.6 x 4.2 cm and of the distal descending thoracic aorta approximately 5.2 x 5.2 cm.  INTERVAL HISTORY:  John Maddox is here today for repeat clinical assessment for stage IIIA non-small cell lung cancer. He finished his treatment in March, 2021, so he is over 3 years off treatment now. He states that he feels good today and complains of pain in his back, hips, and shoulders. He had a CT chest, abdomen, and pelvis on 05/30/2022 that showed no evidence of recurrent or metastatic carcinoma within the chest or abdomen, stable 5.7 cm distal descending thoracic aortic aneurysm, a stable 4.6 cm suprarenal abdominal aortic aneurysm, an small umbilical hernia which is stable and a tiny non-obstructing right renal calculus. His port was flushed during his scan. His WBC is 6.3, hemoglobin stable 11.7, and platelet count 161,000 today. His CMP and CEA are normal. I will see him back in 6 months with CBC, CMP, CEA, and CT of chest, abdomen, and pelvis. He denies signs of infection such as sore throat, sinus drainage, cough, or urinary symptoms.  He denies fevers or recurrent chills. He denies pain. He denies nausea, vomiting, chest pain, dyspnea or cough. His appetite is ok and his weight has increased 3 pounds over last 2.5 weeks . This patient is accompanied in the office by his  wife .   REVIEW OF SYSTEMS:  Review of Systems  Constitutional:  Positive for fatigue. Negative for appetite change, chills, diaphoresis, fever and unexpected weight change.  HENT:  Negative.  Negative for hearing loss, lump/mass, mouth sores, nosebleeds, sore throat, tinnitus, trouble swallowing and voice change.   Eyes: Negative.  Negative for eye problems  and icterus.  Respiratory:  Positive for shortness of breath (With exertion). Negative for chest tightness, cough, hemoptysis and wheezing.   Cardiovascular: Negative.  Negative for chest pain, leg swelling and palpitations.  Gastrointestinal: Negative.  Negative for abdominal distention, abdominal pain, blood in stool, constipation, diarrhea, nausea, rectal pain and vomiting.  Genitourinary: Negative.  Negative for bladder incontinence, difficulty urinating, dyspareunia, dysuria, frequency, hematuria, nocturia, pelvic pain and penile discharge.   Musculoskeletal:  Positive for arthralgias (Chronic, stable due to degenerative disease), back pain and gait problem (Due to degenerative disease). Negative for flank pain, myalgias, neck pain and neck stiffness.       Pain in back, hip, and shoulders  Skin: Negative.  Negative for itching, rash and wound.  Neurological:  Positive for gait problem (Due to degenerative disease). Negative for dizziness, extremity weakness, headaches, light-headedness, numbness, seizures and speech difficulty.  Hematological:  Negative for adenopathy. Does not bruise/bleed easily.  Psychiatric/Behavioral: Negative.  Negative for confusion, decreased concentration, depression, sleep disturbance and suicidal ideas. The patient is not nervous/anxious.  VITALS:  Blood pressure (!) 147/71, pulse 92, temperature (!) 97.5 F (36.4 C), temperature source Oral, resp. rate 18, height 5\' 10"  (1.778 m), weight 233 lb 4.8 oz (105.8 kg), SpO2 98 %.  Wt Readings from Last 3 Encounters:  06/01/22 233 lb 4.8 oz (105.8 kg)  05/15/22 230 lb (104.3 kg)  02/02/22 237 lb 9.6 oz (107.8 kg)    Body mass index is 33.48 kg/m.  Performance status (ECOG): 2 - Symptomatic, <50% confined to bed  PHYSICAL EXAM:  Physical Exam Vitals and nursing note reviewed. Exam conducted with a chaperone present.  Constitutional:      General: He is not in acute distress.    Appearance: Normal  appearance. He is normal weight. He is not ill-appearing, toxic-appearing or diaphoretic.  HENT:     Head: Normocephalic and atraumatic.     Right Ear: Tympanic membrane, ear canal and external ear normal. There is no impacted cerumen.     Left Ear: Tympanic membrane, ear canal and external ear normal. There is no impacted cerumen.     Nose: Nose normal. No congestion or rhinorrhea.     Mouth/Throat:     Mouth: Mucous membranes are moist.     Pharynx: Oropharynx is clear. No oropharyngeal exudate or posterior oropharyngeal erythema.  Eyes:     General: No scleral icterus.       Right eye: No discharge.        Left eye: No discharge.     Extraocular Movements: Extraocular movements intact.     Conjunctiva/sclera: Conjunctivae normal.     Pupils: Pupils are equal, round, and reactive to light.  Neck:     Vascular: No carotid bruit.  Cardiovascular:     Rate and Rhythm: Normal rate and regular rhythm.     Pulses: Normal pulses.     Heart sounds: Normal heart sounds. No murmur heard.    No friction rub. No gallop.  Pulmonary:     Effort: Pulmonary effort is normal. No respiratory distress.     Breath sounds: No stridor. Examination of the right-lower field reveals rhonchi. Rhonchi present. No wheezing (faint) or rales.  Chest:     Chest wall: No tenderness.  Abdominal:     General: Bowel sounds are normal. There is no distension.     Palpations: Abdomen is soft. There is no hepatomegaly, splenomegaly or mass.     Tenderness: There is no abdominal tenderness. There is no right CVA tenderness, left CVA tenderness, guarding or rebound.     Hernia: No hernia is present.  Musculoskeletal:        General: No swelling, tenderness, deformity or signs of injury. Normal range of motion.     Cervical back: Normal range of motion and neck supple. No rigidity or tenderness.     Right lower leg: Edema (2+) present.     Left lower leg: Edema (2+) present.  Lymphadenopathy:     Cervical: No  cervical adenopathy.     Upper Body:     Right upper body: No supraclavicular or axillary adenopathy.     Left upper body: No supraclavicular or axillary adenopathy.     Lower Body: No right inguinal adenopathy. No left inguinal adenopathy.  Skin:    General: Skin is warm and dry.     Coloration: Skin is not jaundiced or pale.     Findings: No bruising, erythema, lesion or rash.  Neurological:     General: No focal deficit present.  Mental Status: He is alert and oriented to person, place, and time. Mental status is at baseline.     Cranial Nerves: No cranial nerve deficit.     Sensory: No sensory deficit.     Motor: No weakness.     Coordination: Coordination normal.     Gait: Gait normal.     Deep Tendon Reflexes: Reflexes normal.  Psychiatric:        Mood and Affect: Mood normal.        Behavior: Behavior normal.        Thought Content: Thought content normal.        Judgment: Judgment normal.     LABS:      Latest Ref Rng & Units 05/30/2022   12:00 AM 02/02/2022    2:35 PM 10/31/2021   12:00 AM  CBC  WBC  6.3     6.3  5.6       5.6      Hemoglobin 13.5 - 17.5 11.7     11.7  12.5       12.5      Hematocrit 41 - 53 36     37.1  38       38      Platelets 150 - 400 K/uL 161     185  169       169         This result is from an external source.   Multiple values from one day are sorted in reverse-chronological order      Latest Ref Rng & Units 05/30/2022   12:00 AM 02/02/2022    2:35 PM 10/31/2021   12:00 AM  CMP  Glucose 70 - 99 mg/dL  97    BUN 4 - 21 19     17  14       14       Creatinine 0.6 - 1.3 1.1     1.18  1.0       1.0      Sodium 137 - 147 137     139  137       137      Potassium 3.5 - 5.1 mEq/L 4.5     3.6  4.2       4.2      Chloride 99 - 108 103     106  106       106      CO2 13 - 22 28     25  28       28       Calcium 8.7 - 10.7 8.8     8.4  9.2       9.2      Total Protein 6.5 - 8.1 g/dL  6.5    Total Bilirubin 0.3 - 1.2 mg/dL  0.4     Alkaline Phos 25 - 125 168     146  145       145      AST 14 - 40 28     24  33       33      ALT 10 - 40 U/L 22     21  27       27          This result is from an external source.   Multiple values from one day are sorted in reverse-chronological order   Lab Results  Component Value Date   CEA1  2.1 02/01/2021   CEA 2.2 05/30/2022   /  CEA  Date Value Ref Range Status  05/30/2022 2.2  Final  02/01/2021 2.1 0.0 - 4.7 ng/mL Final    Comment:    (NOTE)                             Nonsmokers          <3.9                             Smokers             <5.6 [employer] Electrochemiluminescence Immunoassay (ECLIA) Values obtained with different assay methods or kits cannot be used interchangeably.  Results cannot be interpreted as absolute evidence of the presence or absence of malignant disease. Performed At: [facility] [address] [place], [place] [id number] Jolene Schimke MD NW:[id number]    STUDIES:  Exam: 05/30/2022 CT Chest and Abdomen with Contrast Impression: No evidence of recurrent or metastatic carcinoma within the chest or abdomen. Stable 5.7cm distal descending thoracic aortic aneurysm. Stable 4.6cm suprarenal abdominal aortic aneurysm. Recommended follow-up CT/MR every 6 months and vascular consultation. This recommendation follows ACR consensus guidelines. White Paper of the ACR Incidental Findings Committee II on Vascular Findings. J Am Coll Radiol 2013; 10: 789-794 Stable small umbilical hernia, which contains only fat. Tiny non-obstructing right renal calculus. Aortic Atherosclerosis (ICD10-170.0) and Emphysema (ICD10-J43.9)   EXAM:10/31/21 CT CHEST WITH CONTRAST IMPRESSION: Stable band of pleura-parenchymal thickening in the RIGHT lower lobe at prior lung cancer treatment sitre. No evidence of lung cancer recurrence or metastasis. Stable aneurysmal dilation of the proximal and distal descending thoracic aorta. Coronary artery  calcification and Aortic Atherosclerosis. (ICD 10-170.0).   HISTORY:   Past Medical History:  Diagnosis Date   AAA (abdominal aortic aneurysm) without rupture (HCC) 08/03/2015   AKI (acute kidney injury) (HCC) 01/08/2018   Last Assessment & Plan:  Improved Nephrology consulted Likely 2/2 ATN and CIN in the setting of contrast at the outside hospital on 12/21, NSAIDs at the outside hospital, intra-operative hypotension, and entresto use. Avoid nephrotoxic medications(entresto/diuretic) Renally dose medications Strict I&O Monitor creatinine(1.68 today)     Anemia 01/17/2018   Last Assessment & Plan:  Due to chemotherapy and anemia of chronic disease Iron panel shows anemia of chronic disease Follow up with PCP and oncologist   Arrhythmia    Asthma 09/23/2017   Atherosclerosis of native artery of both lower extremities (HCC) 08/03/2015   Bilateral carotid artery stenosis 08/03/2015   Cardiomyopathy (HCC) 11/18/2017   Chronic anticoagulation 06/05/2018   Chronic combined systolic and diastolic heart failure (HCC) 10/03/2017   Congestive heart failure (HCC) 09/23/2017   COPD (chronic obstructive pulmonary disease) (HCC) 09/23/2017   Diabetes mellitus (HCC) 09/23/2017   Diarrhea 01/14/2018   Last Assessment & Plan:  C diff - negative  GIP - negative Resolved   Diastolic dysfunction 09/23/2017   Duodenal nodule 11/10/2014   Esophageal reflux 09/23/2017   Essential hypertension 11/10/2014   Last Assessment & Plan:  Controlled off of medication Follow up with PCP as an outpatient   Fatigue 09/23/2017   Gout 09/23/2017   Hematuria 01/14/2018   Last Assessment & Plan:  Need UA as outpatient.   History of prostate cancer 09/23/2017   Hyperlipidemia 09/23/2017   Hypertension 09/23/2017   Hypertensive heart disease 09/23/2017   Hypertensive heart disease with  heart failure (HCC) 06/06/2018   Hypokalemia 01/14/2018   Last Assessment & Plan:  Replace and follow up with PCP   Hypomagnesemia 01/14/2018   Last Assessment &  Plan:  Replace and monitor and follow up with PCP   Hypophosphatemia 01/15/2018   Last Assessment & Plan:  Resolved   Infected prosthetic knee joint, initial encounter (HCC) 03/06/2018   Insulin dependent type 2 diabetes mellitus (HCC) 11/10/2014   Last Assessment & Plan:  Glucose 109-140 Continue ISS Monitor glucose   Left ventricular dysfunction 10/03/2017   Lung cancer (HCC)    Lung mass 10/04/2017   Non-small cell lung cancer (HCC) 10/03/2017   Pulmonary emboli (HCC) 03/06/2018   Pulmonary embolism (HCC)    PVC's (premature ventricular contractions) 09/23/2017   PVD (peripheral vascular disease) (HCC) 11/10/2014   Septic arthritis of knee, left (HCC) 01/04/2018   Added automatically from request for surgery [id number]  Last Assessment & Plan:  Prosthetic knee infection S/p wash out Culture negative  Continue Dapto + Ceftriaxone + Rifampin for 4 weeks(currently day 7) ID was notified today prior to discharge He will need CBC, CMP, ESR, CRP weekly faxed to [phone](OPAT with ID) ID outpatient appointment is on 02/17/2017  Antibiotics administered by port by ho   Thrombocytopenia (HCC) 01/15/2018   Last Assessment & Plan:  Decreased to 78.  Due to chemotherapy Peripheral smear negative for schistocytes LDH 211(within normal limits), haptoglobin 329(elevated), Retic 1.7 B12=714, Folate=20, copper pending    Past Surgical History:  Procedure Laterality Date   ABDOMINAL AORTIC ANEURYSM REPAIR     AORTA - ILIAC ARTERY BYPASS GRAFT     BACK SURGERY     CATARACT EXTRACTION, BILATERAL     CHOLECYSTECTOMY     HEMICOLECTOMY     HERNIA REPAIR     KNEE SURGERY Left    replaced all the hardwear   PROSTATE SURGERY     REPLACEMENT TOTAL KNEE     STENT PLACEMENT VASCULAR ([facility] HX)     TONSILLECTOMY      Family History  Problem Relation Age of Onset   Arthritis Mother    Lung cancer Mother    Hypertension Mother    Arthritis Father    Heart attack Father    Prostate cancer Father    Hypertension  Father    Hyperlipidemia Father    Heart disease Father    Alcohol abuse Father    Arthritis Brother    Lung cancer Brother     Social History:  reports that he quit smoking about 10 years ago. His smoking use included cigarettes. He has a 100.00 pack-year smoking history. He has been exposed to tobacco smoke. He has never used smokeless tobacco. He reports that he does not currently use alcohol. He reports that he does not currently use drugs.The patient is accompanied by his wife today.  Allergies:  Allergies  Allergen Reactions   Insulin Glargine     Stomach pain  Other Reaction(s): GI Intolerance  Stomach pain and urinary frequency   Quinolones Other (See Comments)    History of aortic aneurysm status post repair.  Use with caution    Current Medications: Current Outpatient Medications  Medication Sig Dispense Refill   allopurinol (ZYLOPRIM) 300 MG tablet Take 300 mg by mouth daily.     apixaban (ELIQUIS) 5 MG TABS tablet Take 1 tablet (5 mg total) by mouth 2 (two) times daily. 180 tablet 3   carvedilol (COREG) 3.125 MG tablet TAKE 1 TABLET TWICE  A DAY 180 tablet 3   celecoxib (CELEBREX) 200 MG capsule Take by mouth 2 (two) times daily.     fluticasone-salmeterol (ADVAIR HFA) 115-21 MCG/ACT inhaler Inhale 2 puffs into the lungs 2 (two) times daily.      gabapentin (NEURONTIN) 100 MG capsule Take 100 mg by mouth 2 (two) times daily.     metFORMIN (GLUCOPHAGE) 500 MG tablet Take 500 mg by mouth daily with breakfast. 2 tablets daily     omeprazole (PRILOSEC) 20 MG capsule Take 20 mg by mouth daily.     oxyCODONE (OXY IR/ROXICODONE) 5 MG immediate release tablet Take 1 tablet (5 mg total) by mouth every 6 (six) hours as needed for severe pain. 120 tablet 0   OXYGEN Inhale into the lungs continuous. 2 liters     PROAIR HFA 108 (90 Base) MCG/ACT inhaler Inhale 1 puff into the lungs every 6 (six) hours as needed.      sacubitril-valsartan (ENTRESTO) 97-103 MG Take 1 tablet by mouth  2 (two) times daily. 180 tablet 1   simvastatin (ZOCOR) 40 MG tablet Take 1 tablet (40 mg total) by mouth daily. 90 tablet 3   SPIRIVA RESPIMAT 2.5 MCG/ACT AERS SMARTSIG:2 Puff(s) Via Inhaler Daily     tamsulosin (FLOMAX) 0.4 MG CAPS capsule Take 0.4 mg by mouth daily.     torsemide (DEMADEX) 20 MG tablet TAKE 1 TABLET DAILY, TAKE 1 EXTRA TABLET AT NOON ON MONDAY, WEDNESDAY, AND FRIDAY (Patient taking differently: Take 20 mg by mouth 2 (two) times daily. Takes 2 tablets  (40 mg) daily) 130 tablet 3   No current facility-administered medications for this visit.     I,Jasmine M Lassiter,acting as a scribe for Dellia Beckwith, MD.,have documented all relevant documentation on the behalf of Dellia Beckwith, MD,as directed by  Dellia Beckwith, MD while in the presence of Dellia Beckwith, MD.

## 2022-06-01 ENCOUNTER — Inpatient Hospital Stay: Payer: Medicare Other | Attending: Oncology | Admitting: Oncology

## 2022-06-01 ENCOUNTER — Other Ambulatory Visit: Payer: Self-pay | Admitting: Oncology

## 2022-06-01 ENCOUNTER — Encounter: Payer: Self-pay | Admitting: Oncology

## 2022-06-01 VITALS — BP 147/71 | HR 92 | Temp 97.5°F | Resp 18 | Ht 70.0 in | Wt 233.3 lb

## 2022-06-01 DIAGNOSIS — I714 Abdominal aortic aneurysm, without rupture, unspecified: Secondary | ICD-10-CM

## 2022-06-01 DIAGNOSIS — C3491 Malignant neoplasm of unspecified part of right bronchus or lung: Secondary | ICD-10-CM

## 2022-06-04 LAB — CEA: CEA: 2.2

## 2022-06-13 ENCOUNTER — Encounter: Payer: Self-pay | Admitting: Oncology

## 2022-06-17 ENCOUNTER — Encounter: Payer: Self-pay | Admitting: Hematology and Oncology

## 2022-06-21 DIAGNOSIS — E1169 Type 2 diabetes mellitus with other specified complication: Secondary | ICD-10-CM | POA: Diagnosis not present

## 2022-06-21 DIAGNOSIS — K219 Gastro-esophageal reflux disease without esophagitis: Secondary | ICD-10-CM | POA: Diagnosis not present

## 2022-06-21 DIAGNOSIS — I110 Hypertensive heart disease with heart failure: Secondary | ICD-10-CM | POA: Diagnosis not present

## 2022-06-21 DIAGNOSIS — M109 Gout, unspecified: Secondary | ICD-10-CM | POA: Diagnosis not present

## 2022-06-21 DIAGNOSIS — Z79899 Other long term (current) drug therapy: Secondary | ICD-10-CM | POA: Diagnosis not present

## 2022-06-21 DIAGNOSIS — E785 Hyperlipidemia, unspecified: Secondary | ICD-10-CM | POA: Diagnosis not present

## 2022-06-21 DIAGNOSIS — Z8546 Personal history of malignant neoplasm of prostate: Secondary | ICD-10-CM | POA: Diagnosis not present

## 2022-06-21 DIAGNOSIS — I712 Thoracic aortic aneurysm, without rupture, unspecified: Secondary | ICD-10-CM | POA: Diagnosis not present

## 2022-06-21 DIAGNOSIS — G620 Drug-induced polyneuropathy: Secondary | ICD-10-CM | POA: Diagnosis not present

## 2022-07-16 DIAGNOSIS — I7123 Aneurysm of the descending thoracic aorta, without rupture: Secondary | ICD-10-CM | POA: Diagnosis not present

## 2022-07-24 ENCOUNTER — Other Ambulatory Visit: Payer: Self-pay

## 2022-07-24 DIAGNOSIS — G8929 Other chronic pain: Secondary | ICD-10-CM

## 2022-07-24 MED ORDER — OXYCODONE HCL 5 MG PO TABS
5.0000 mg | ORAL_TABLET | Freq: Four times a day (QID) | ORAL | 0 refills | Status: DC | PRN
Start: 2022-07-24 — End: 2022-08-23

## 2022-08-10 ENCOUNTER — Other Ambulatory Visit: Payer: Self-pay | Admitting: Cardiology

## 2022-08-23 ENCOUNTER — Other Ambulatory Visit: Payer: Self-pay

## 2022-08-23 DIAGNOSIS — G8929 Other chronic pain: Secondary | ICD-10-CM

## 2022-08-23 MED ORDER — OXYCODONE HCL 5 MG PO TABS
5.0000 mg | ORAL_TABLET | Freq: Four times a day (QID) | ORAL | 0 refills | Status: DC | PRN
Start: 2022-08-23 — End: 2022-09-24

## 2022-08-24 ENCOUNTER — Encounter: Payer: Self-pay | Admitting: Hematology and Oncology

## 2022-08-27 ENCOUNTER — Other Ambulatory Visit: Payer: Self-pay | Admitting: Cardiology

## 2022-09-24 ENCOUNTER — Other Ambulatory Visit: Payer: Self-pay

## 2022-09-24 DIAGNOSIS — G8929 Other chronic pain: Secondary | ICD-10-CM

## 2022-09-24 MED ORDER — OXYCODONE HCL 5 MG PO TABS
5.0000 mg | ORAL_TABLET | Freq: Four times a day (QID) | ORAL | 0 refills | Status: DC | PRN
Start: 2022-09-24 — End: 2022-10-24

## 2022-09-25 ENCOUNTER — Encounter: Payer: Self-pay | Admitting: Hematology and Oncology

## 2022-10-24 ENCOUNTER — Other Ambulatory Visit: Payer: Self-pay

## 2022-10-24 DIAGNOSIS — G8929 Other chronic pain: Secondary | ICD-10-CM

## 2022-10-24 MED ORDER — OXYCODONE HCL 5 MG PO TABS: 5.0000 mg | ORAL_TABLET | Freq: Four times a day (QID) | ORAL | 0 refills | Status: DC | PRN

## 2022-10-25 DIAGNOSIS — Z85118 Personal history of other malignant neoplasm of bronchus and lung: Secondary | ICD-10-CM | POA: Diagnosis not present

## 2022-10-25 DIAGNOSIS — I110 Hypertensive heart disease with heart failure: Secondary | ICD-10-CM | POA: Diagnosis not present

## 2022-10-25 DIAGNOSIS — Z23 Encounter for immunization: Secondary | ICD-10-CM | POA: Diagnosis not present

## 2022-10-25 DIAGNOSIS — T451X5A Adverse effect of antineoplastic and immunosuppressive drugs, initial encounter: Secondary | ICD-10-CM | POA: Diagnosis not present

## 2022-10-25 DIAGNOSIS — E785 Hyperlipidemia, unspecified: Secondary | ICD-10-CM | POA: Diagnosis not present

## 2022-10-25 DIAGNOSIS — M109 Gout, unspecified: Secondary | ICD-10-CM | POA: Diagnosis not present

## 2022-10-25 DIAGNOSIS — Z86711 Personal history of pulmonary embolism: Secondary | ICD-10-CM | POA: Diagnosis not present

## 2022-10-25 DIAGNOSIS — K219 Gastro-esophageal reflux disease without esophagitis: Secondary | ICD-10-CM | POA: Diagnosis not present

## 2022-10-25 DIAGNOSIS — E1169 Type 2 diabetes mellitus with other specified complication: Secondary | ICD-10-CM | POA: Diagnosis not present

## 2022-10-25 DIAGNOSIS — J449 Chronic obstructive pulmonary disease, unspecified: Secondary | ICD-10-CM | POA: Diagnosis not present

## 2022-10-25 DIAGNOSIS — Z8546 Personal history of malignant neoplasm of prostate: Secondary | ICD-10-CM | POA: Diagnosis not present

## 2022-10-25 DIAGNOSIS — G620 Drug-induced polyneuropathy: Secondary | ICD-10-CM | POA: Diagnosis not present

## 2022-11-14 NOTE — Progress Notes (Unsigned)
Cardiology Office Note:    Date:  11/15/2022   ID:  John Maddox. John, Maddox 1940/07/05, MRN [id number]  PCP:  Paulina Fusi, MD  Cardiologist:  Norman Herrlich, MD    Referring MD: Paulina Fusi, MD    ASSESSMENT:    1. Hypertensive heart disease with heart failure (HCC)   2. Chronic anticoagulation   3. Aneurysm of descending thoracic aorta without rupture (HCC)   4. Non-small cell lung cancer, unspecified laterality (HCC)   5. Mixed hyperlipidemia    PLAN:    In order of problems listed above:  Continues to do well recovered ejection fraction continue his current medical regimen including his loop diuretic beta-blocker Continues long-term anticoagulation with lung cancer and pulmonary embolism He is decided not to pursue intervention for his complex thoracoabdominal aneurysm Stable oncology followed locally Continue with statin lipids are at target LDL 10/25/2022 59 cholesterol 111 creatinine 1.15 potassium 4.4   Next appointment: 6 months   Medication Adjustments/Labs and Tests Ordered: Current medicines are reviewed at length with the patient today.  Concerns regarding medicines are outlined above.  Orders Placed This Encounter  Procedures   EKG 12-Lead   No orders of the defined types were placed in this encounter.    History of Present Illness:    John Maddox. John Maddox is a 82 y.o. male with a hx of hypertensive heart disease with diastolic heart failure  chronic anticoagulation with previous massive pulmonary embolism thoracic aortic aneurysm abdominal aortic aneurysm hyperlipidemia and lung cancer last seen 05/15/2022.  He was recently seen vascular surgery [facility] for aneurysm descending thoracic aorta with previous EMR abdominal aortic aneurysm.  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   Compliance with diet, lifestyle and medications: Yes  He has made a decision not to pursue vascular surgery I agree with the biggest risk for him is the ability to return home even if the procedure is successful He is pleased with the quality of his life he is living independently with his wife's assistance and is not having cardiovascular symptoms of edema shortness of breath chest pain palpitation or syncope He understands he has multiple severe comorbidities and he really enjoys every day Past Medical History:  Diagnosis Date   AAA (abdominal aortic aneurysm) without rupture (HCC) 08/03/2015   AKI (acute kidney injury) (HCC) 01/08/2018   Last Assessment & Plan:  Improved Nephrology consulted Likely 2/2 ATN and CIN in the setting of contrast at the outside hospital on 12/21, NSAIDs at the outside hospital, intra-operative hypotension, and entresto use. Avoid nephrotoxic medications(entresto/diuretic) Renally dose medications Strict I&O Monitor creatinine(1.68 today)     Anemia 01/17/2018   Last Assessment & Plan:  Due to chemotherapy and anemia of chronic disease Iron panel shows anemia of chronic disease Follow up with PCP and oncologist   Arrhythmia    Asthma 09/23/2017   Atherosclerosis of native artery of both lower extremities (HCC) 08/03/2015   Bilateral carotid artery stenosis 08/03/2015   Cardiomyopathy (HCC) 11/18/2017   Chronic anticoagulation 06/05/2018   Chronic combined systolic and diastolic heart failure (HCC) 10/03/2017   Congestive heart failure (HCC) 09/23/2017   COPD (chronic obstructive pulmonary disease) (HCC) 09/23/2017   Diabetes mellitus (HCC) 09/23/2017   Diarrhea 01/14/2018   Last Assessment & Plan:  C diff -  torsemide (DEMADEX) 20 MG tablet TAKE 1 TABLET DAILY, TAKE 1 EXTRA TABLET AT NOON ON MONDAY, WEDNESDAY, AND FRIDAY (Patient taking differently: Take 20 mg by mouth 2 (two) times daily. Takes 2 tablets  (40 mg) daily)      EKGs/Labs/Other Studies Reviewed:    The following studies were reviewed today:  Cardiac Studies & Procedures     STRESS TESTS  MYOCARDIAL PERFUSION IMAGING 09/28/2017   ECHOCARDIOGRAM  ECHOCARDIOGRAM COMPLETE 10/04/2021  Narrative ECHOCARDIOGRAM REPORT    Patient Name:   John Maddox Date of Exam: 10/04/2021 Medical Rec #:  [id number]           Height:       70.0 in Accession #:    [id number]          Weight:       227.4 lb Date of Birth:  July 16, 1940           BSA:          2.204 m Patient Age:    81 years             BP:           110/70 mmHg Patient Gender: M                   HR:           72 bpm. Exam Location:  [facility]  Procedure: 2D Echo, Cardiac Doppler, Color Doppler and Strain Analysis  Indications:    Hypertensive heart disease with heart failure (HCC) [I11.0 (ICD-10-CM)]; Chronic anticoagulation [Z79.01 (ICD-10-CM)]; Aneurysm of descending thoracic aorta without rupture (HCC) [I69.62 (ICD-10-CM)]; Abdominal aortic aneurysm (AAA) without rupture, unspecified part (HCC) [I71.40 (ICD-10-CM)]; Mixed hyperlipidemia [E78.2 (ICD-10-CM)]  History:        Patient has prior history of Echocardiogram examinations, most recent 04/21/2020. CHF and Cardiomyopathy, CAD, Arrythmias:PVC; Risk Factors:Hypertension and Dyslipidemia. AAA.  Sonographer:    Margreta Journey RDCS Referring Phys: [id number] Anuel Sitter J [facility]   Sonographer Comments: Suboptimal parasternal window and suboptimal subcostal window. IMPRESSIONS   1. TDS. Left ventricular ejection fraction, by estimation, is 55 to 60%. The left ventricle has normal function. Left ventricular endocardial border not optimally defined to evaluate regional wall motion. There is mild left ventricular hypertrophy. Left ventricular diastolic parameters are consistent with Grade I diastolic dysfunction (impaired relaxation). 2. Right ventricular systolic function is normal. The right ventricular size is normal. 3. The mitral valve is normal in structure. No evidence of mitral valve regurgitation. No evidence of mitral stenosis. 4. The aortic valve is calcified. There is mild calcification of the aortic valve. There is mild thickening of the aortic valve. Aortic valve regurgitation is trivial. No aortic stenosis is present. 5. The inferior vena cava is normal in size with greater than 50% respiratory variability, suggesting right atrial pressure of 3 mmHg.  FINDINGS Left Ventricle: TDS. Left ventricular ejection fraction, by estimation, is 55 to 60%. The  left ventricle has normal function. Left ventricular endocardial border not optimally defined to evaluate regional wall motion. The left ventricular internal cavity size was normal in size. There is mild left ventricular hypertrophy. Left ventricular diastolic parameters are consistent with Grade I diastolic dysfunction (impaired relaxation).  Right Ventricle: The right ventricular size is normal. No increase in right ventricular wall thickness. Right ventricular systolic function is normal.  Left Atrium: Left atrial size was normal in size.  Right Atrium: Right atrial size was normal in size.  Pericardium: There is no evidence of  Cardiology Office Note:    Date:  11/15/2022   ID:  John Maddox. John, Maddox 1940/07/05, MRN [id number]  PCP:  Paulina Fusi, MD  Cardiologist:  Norman Herrlich, MD    Referring MD: Paulina Fusi, MD    ASSESSMENT:    1. Hypertensive heart disease with heart failure (HCC)   2. Chronic anticoagulation   3. Aneurysm of descending thoracic aorta without rupture (HCC)   4. Non-small cell lung cancer, unspecified laterality (HCC)   5. Mixed hyperlipidemia    PLAN:    In order of problems listed above:  Continues to do well recovered ejection fraction continue his current medical regimen including his loop diuretic beta-blocker Continues long-term anticoagulation with lung cancer and pulmonary embolism He is decided not to pursue intervention for his complex thoracoabdominal aneurysm Stable oncology followed locally Continue with statin lipids are at target LDL 10/25/2022 59 cholesterol 111 creatinine 1.15 potassium 4.4   Next appointment: 6 months   Medication Adjustments/Labs and Tests Ordered: Current medicines are reviewed at length with the patient today.  Concerns regarding medicines are outlined above.  Orders Placed This Encounter  Procedures   EKG 12-Lead   No orders of the defined types were placed in this encounter.    History of Present Illness:    John Maddox. John Maddox is a 82 y.o. male with a hx of hypertensive heart disease with diastolic heart failure  chronic anticoagulation with previous massive pulmonary embolism thoracic aortic aneurysm abdominal aortic aneurysm hyperlipidemia and lung cancer last seen 05/15/2022.  He was recently seen vascular surgery [facility] for aneurysm descending thoracic aorta with previous EMR abdominal aortic aneurysm.  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   Compliance with diet, lifestyle and medications: Yes  He has made a decision not to pursue vascular surgery I agree with the biggest risk for him is the ability to return home even if the procedure is successful He is pleased with the quality of his life he is living independently with his wife's assistance and is not having cardiovascular symptoms of edema shortness of breath chest pain palpitation or syncope He understands he has multiple severe comorbidities and he really enjoys every day Past Medical History:  Diagnosis Date   AAA (abdominal aortic aneurysm) without rupture (HCC) 08/03/2015   AKI (acute kidney injury) (HCC) 01/08/2018   Last Assessment & Plan:  Improved Nephrology consulted Likely 2/2 ATN and CIN in the setting of contrast at the outside hospital on 12/21, NSAIDs at the outside hospital, intra-operative hypotension, and entresto use. Avoid nephrotoxic medications(entresto/diuretic) Renally dose medications Strict I&O Monitor creatinine(1.68 today)     Anemia 01/17/2018   Last Assessment & Plan:  Due to chemotherapy and anemia of chronic disease Iron panel shows anemia of chronic disease Follow up with PCP and oncologist   Arrhythmia    Asthma 09/23/2017   Atherosclerosis of native artery of both lower extremities (HCC) 08/03/2015   Bilateral carotid artery stenosis 08/03/2015   Cardiomyopathy (HCC) 11/18/2017   Chronic anticoagulation 06/05/2018   Chronic combined systolic and diastolic heart failure (HCC) 10/03/2017   Congestive heart failure (HCC) 09/23/2017   COPD (chronic obstructive pulmonary disease) (HCC) 09/23/2017   Diabetes mellitus (HCC) 09/23/2017   Diarrhea 01/14/2018   Last Assessment & Plan:  C diff -  torsemide (DEMADEX) 20 MG tablet TAKE 1 TABLET DAILY, TAKE 1 EXTRA TABLET AT NOON ON MONDAY, WEDNESDAY, AND FRIDAY (Patient taking differently: Take 20 mg by mouth 2 (two) times daily. Takes 2 tablets  (40 mg) daily)      EKGs/Labs/Other Studies Reviewed:    The following studies were reviewed today:  Cardiac Studies & Procedures     STRESS TESTS  MYOCARDIAL PERFUSION IMAGING 09/28/2017   ECHOCARDIOGRAM  ECHOCARDIOGRAM COMPLETE 10/04/2021  Narrative ECHOCARDIOGRAM REPORT    Patient Name:   John Maddox Date of Exam: 10/04/2021 Medical Rec #:  [id number]           Height:       70.0 in Accession #:    [id number]          Weight:       227.4 lb Date of Birth:  July 16, 1940           BSA:          2.204 m Patient Age:    81 years             BP:           110/70 mmHg Patient Gender: M                   HR:           72 bpm. Exam Location:  [facility]  Procedure: 2D Echo, Cardiac Doppler, Color Doppler and Strain Analysis  Indications:    Hypertensive heart disease with heart failure (HCC) [I11.0 (ICD-10-CM)]; Chronic anticoagulation [Z79.01 (ICD-10-CM)]; Aneurysm of descending thoracic aorta without rupture (HCC) [I69.62 (ICD-10-CM)]; Abdominal aortic aneurysm (AAA) without rupture, unspecified part (HCC) [I71.40 (ICD-10-CM)]; Mixed hyperlipidemia [E78.2 (ICD-10-CM)]  History:        Patient has prior history of Echocardiogram examinations, most recent 04/21/2020. CHF and Cardiomyopathy, CAD, Arrythmias:PVC; Risk Factors:Hypertension and Dyslipidemia. AAA.  Sonographer:    Margreta Journey RDCS Referring Phys: [id number] Anuel Sitter J [facility]   Sonographer Comments: Suboptimal parasternal window and suboptimal subcostal window. IMPRESSIONS   1. TDS. Left ventricular ejection fraction, by estimation, is 55 to 60%. The left ventricle has normal function. Left ventricular endocardial border not optimally defined to evaluate regional wall motion. There is mild left ventricular hypertrophy. Left ventricular diastolic parameters are consistent with Grade I diastolic dysfunction (impaired relaxation). 2. Right ventricular systolic function is normal. The right ventricular size is normal. 3. The mitral valve is normal in structure. No evidence of mitral valve regurgitation. No evidence of mitral stenosis. 4. The aortic valve is calcified. There is mild calcification of the aortic valve. There is mild thickening of the aortic valve. Aortic valve regurgitation is trivial. No aortic stenosis is present. 5. The inferior vena cava is normal in size with greater than 50% respiratory variability, suggesting right atrial pressure of 3 mmHg.  FINDINGS Left Ventricle: TDS. Left ventricular ejection fraction, by estimation, is 55 to 60%. The  left ventricle has normal function. Left ventricular endocardial border not optimally defined to evaluate regional wall motion. The left ventricular internal cavity size was normal in size. There is mild left ventricular hypertrophy. Left ventricular diastolic parameters are consistent with Grade I diastolic dysfunction (impaired relaxation).  Right Ventricle: The right ventricular size is normal. No increase in right ventricular wall thickness. Right ventricular systolic function is normal.  Left Atrium: Left atrial size was normal in size.  Right Atrium: Right atrial size was normal in size.  Pericardium: There is no evidence of  Cardiology Office Note:    Date:  11/15/2022   ID:  John Maddox. John, Maddox 1940/07/05, MRN [id number]  PCP:  Paulina Fusi, MD  Cardiologist:  Norman Herrlich, MD    Referring MD: Paulina Fusi, MD    ASSESSMENT:    1. Hypertensive heart disease with heart failure (HCC)   2. Chronic anticoagulation   3. Aneurysm of descending thoracic aorta without rupture (HCC)   4. Non-small cell lung cancer, unspecified laterality (HCC)   5. Mixed hyperlipidemia    PLAN:    In order of problems listed above:  Continues to do well recovered ejection fraction continue his current medical regimen including his loop diuretic beta-blocker Continues long-term anticoagulation with lung cancer and pulmonary embolism He is decided not to pursue intervention for his complex thoracoabdominal aneurysm Stable oncology followed locally Continue with statin lipids are at target LDL 10/25/2022 59 cholesterol 111 creatinine 1.15 potassium 4.4   Next appointment: 6 months   Medication Adjustments/Labs and Tests Ordered: Current medicines are reviewed at length with the patient today.  Concerns regarding medicines are outlined above.  Orders Placed This Encounter  Procedures   EKG 12-Lead   No orders of the defined types were placed in this encounter.    History of Present Illness:    John Maddox. John Maddox is a 82 y.o. male with a hx of hypertensive heart disease with diastolic heart failure  chronic anticoagulation with previous massive pulmonary embolism thoracic aortic aneurysm abdominal aortic aneurysm hyperlipidemia and lung cancer last seen 05/15/2022.  He was recently seen vascular surgery [facility] for aneurysm descending thoracic aorta with previous EMR abdominal aortic aneurysm.  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   Compliance with diet, lifestyle and medications: Yes  He has made a decision not to pursue vascular surgery I agree with the biggest risk for him is the ability to return home even if the procedure is successful He is pleased with the quality of his life he is living independently with his wife's assistance and is not having cardiovascular symptoms of edema shortness of breath chest pain palpitation or syncope He understands he has multiple severe comorbidities and he really enjoys every day Past Medical History:  Diagnosis Date   AAA (abdominal aortic aneurysm) without rupture (HCC) 08/03/2015   AKI (acute kidney injury) (HCC) 01/08/2018   Last Assessment & Plan:  Improved Nephrology consulted Likely 2/2 ATN and CIN in the setting of contrast at the outside hospital on 12/21, NSAIDs at the outside hospital, intra-operative hypotension, and entresto use. Avoid nephrotoxic medications(entresto/diuretic) Renally dose medications Strict I&O Monitor creatinine(1.68 today)     Anemia 01/17/2018   Last Assessment & Plan:  Due to chemotherapy and anemia of chronic disease Iron panel shows anemia of chronic disease Follow up with PCP and oncologist   Arrhythmia    Asthma 09/23/2017   Atherosclerosis of native artery of both lower extremities (HCC) 08/03/2015   Bilateral carotid artery stenosis 08/03/2015   Cardiomyopathy (HCC) 11/18/2017   Chronic anticoagulation 06/05/2018   Chronic combined systolic and diastolic heart failure (HCC) 10/03/2017   Congestive heart failure (HCC) 09/23/2017   COPD (chronic obstructive pulmonary disease) (HCC) 09/23/2017   Diabetes mellitus (HCC) 09/23/2017   Diarrhea 01/14/2018   Last Assessment & Plan:  C diff -

## 2022-11-15 ENCOUNTER — Ambulatory Visit: Payer: Medicare Other | Attending: Cardiology | Admitting: Cardiology

## 2022-11-15 ENCOUNTER — Encounter: Payer: Self-pay | Admitting: Cardiology

## 2022-11-15 ENCOUNTER — Telehealth: Payer: Self-pay | Admitting: Oncology

## 2022-11-15 VITALS — BP 138/64 | HR 78 | Ht 70.0 in | Wt 234.4 lb

## 2022-11-15 DIAGNOSIS — I7123 Aneurysm of the descending thoracic aorta, without rupture: Secondary | ICD-10-CM | POA: Insufficient documentation

## 2022-11-15 DIAGNOSIS — C3490 Malignant neoplasm of unspecified part of unspecified bronchus or lung: Secondary | ICD-10-CM | POA: Diagnosis not present

## 2022-11-15 DIAGNOSIS — E782 Mixed hyperlipidemia: Secondary | ICD-10-CM | POA: Diagnosis not present

## 2022-11-15 DIAGNOSIS — I110 Hypertensive heart disease with heart failure: Secondary | ICD-10-CM | POA: Diagnosis not present

## 2022-11-15 DIAGNOSIS — Z7901 Long term (current) use of anticoagulants: Secondary | ICD-10-CM | POA: Insufficient documentation

## 2022-11-15 NOTE — Patient Instructions (Signed)

## 2022-11-15 NOTE — Telephone Encounter (Signed)
CT C/A/P has been scheduled for 12/03/22 @ 1:45; Check in @ 12:45   Notified pt of date,time and instructions.

## 2022-11-21 DIAGNOSIS — R509 Fever, unspecified: Secondary | ICD-10-CM | POA: Diagnosis not present

## 2022-11-21 DIAGNOSIS — I10 Essential (primary) hypertension: Secondary | ICD-10-CM | POA: Diagnosis not present

## 2022-11-21 DIAGNOSIS — I444 Left anterior fascicular block: Secondary | ICD-10-CM | POA: Diagnosis not present

## 2022-11-21 DIAGNOSIS — R9431 Abnormal electrocardiogram [ECG] [EKG]: Secondary | ICD-10-CM | POA: Diagnosis not present

## 2022-11-21 DIAGNOSIS — R531 Weakness: Secondary | ICD-10-CM | POA: Diagnosis not present

## 2022-11-21 DIAGNOSIS — R0602 Shortness of breath: Secondary | ICD-10-CM | POA: Diagnosis not present

## 2022-11-23 ENCOUNTER — Other Ambulatory Visit: Payer: Self-pay

## 2022-11-23 DIAGNOSIS — G8929 Other chronic pain: Secondary | ICD-10-CM

## 2022-11-23 MED ORDER — OXYCODONE HCL 5 MG PO TABS: 5.0000 mg | ORAL_TABLET | Freq: Four times a day (QID) | ORAL | 0 refills | Status: DC | PRN

## 2022-12-03 DIAGNOSIS — I7122 Aneurysm of the aortic arch, without rupture: Secondary | ICD-10-CM | POA: Diagnosis not present

## 2022-12-03 DIAGNOSIS — J9811 Atelectasis: Secondary | ICD-10-CM | POA: Diagnosis not present

## 2022-12-03 DIAGNOSIS — R970 Elevated carcinoembryonic antigen [CEA]: Secondary | ICD-10-CM | POA: Diagnosis not present

## 2022-12-03 LAB — LAB REPORT - SCANNED: EGFR: 60

## 2022-12-04 ENCOUNTER — Inpatient Hospital Stay: Payer: Medicare Other | Admitting: Oncology

## 2022-12-04 ENCOUNTER — Inpatient Hospital Stay: Payer: Medicare Other | Attending: Oncology | Admitting: Oncology

## 2022-12-04 VITALS — BP 118/61 | HR 73 | Resp 16 | Ht 70.0 in | Wt 228.8 lb

## 2022-12-04 DIAGNOSIS — Z86711 Personal history of pulmonary embolism: Secondary | ICD-10-CM | POA: Insufficient documentation

## 2022-12-04 DIAGNOSIS — R9389 Abnormal findings on diagnostic imaging of other specified body structures: Secondary | ICD-10-CM | POA: Diagnosis not present

## 2022-12-04 DIAGNOSIS — C3491 Malignant neoplasm of unspecified part of right bronchus or lung: Secondary | ICD-10-CM | POA: Diagnosis not present

## 2022-12-04 DIAGNOSIS — Z9221 Personal history of antineoplastic chemotherapy: Secondary | ICD-10-CM | POA: Insufficient documentation

## 2022-12-04 DIAGNOSIS — Z86718 Personal history of other venous thrombosis and embolism: Secondary | ICD-10-CM | POA: Insufficient documentation

## 2022-12-04 DIAGNOSIS — Z923 Personal history of irradiation: Secondary | ICD-10-CM | POA: Insufficient documentation

## 2022-12-04 DIAGNOSIS — C3410 Malignant neoplasm of upper lobe, unspecified bronchus or lung: Secondary | ICD-10-CM | POA: Diagnosis not present

## 2022-12-04 DIAGNOSIS — Z8674 Personal history of sudden cardiac arrest: Secondary | ICD-10-CM | POA: Insufficient documentation

## 2022-12-04 NOTE — Progress Notes (Signed)
[facility] Cancer Initial Visit:  Patient Care Team: Paulina Fusi, MD as PCP - General (Internal Medicine) Dulce Sellar Iline Oven, MD as PCP - Cardiology (Cardiology) Dellia Beckwith, MD as Consulting Physician (Oncology)  CHIEF COMPLAINTS/PURPOSE OF CONSULTATION:  Oncology History  Non-small cell lung cancer (HCC)  10/03/2017 Initial Diagnosis   Non-small cell lung cancer (HCC)   10/03/2017 Cancer Staging   Staging form: Lung, AJCC 8th Edition - Clinical stage from 10/03/2017: Stage IIIA (cT3, cN1, cM0) - Signed by Dellia Beckwith, MD on 08/18/2020 Histopathologic type: Adenocarcinoma, NOS Stage prefix: Initial diagnosis Histologic grade (G): GX Histologic grading system: 4 grade system Laterality: Right Tumor size (mm): 62 Lymph-vascular invasion (LVI): Presence of LVI unknown/indeterminate Diagnostic confirmation: Positive histology Specimen type: Core Needle Biopsy Staged by: Managing physician Stage used in treatment planning: Yes National guidelines used in treatment planning: Yes Type of national guideline used in treatment planning: NCCN Staging comments: Not surgical candidate Concurrent chemoradiation with carbo/taxol, followed by 1 yr Durvalumab     HISTORY OF PRESENTING ILLNESS: John Maddox 82 y.o. male is here because of lung cancer Medical history notable for moderate emphysema, a distal descending thoracic aortic aneurysm measuring 4.4 cm, hypertension, congestive heart failure, diabetes mellitus, chronic kidney disease, multiple aortic aneurysms, peripheral artery disease, and history of prostate cancer 11 years ago, CHF with ejection fraction 37%.  Peripheral neuropathy.   September 2019:  Diagnosed with stage IIIA (T3 N1 M0) non-small cell lung cancer.  Presented with severe bradycardia and left chest discomfort.  Found to have a 6.2 cm mass in the right upper lobe with 1 paratracheal node measuring 1-2 cm.  Bronchial brushings and  washings revealed malignant cells consistent with non-small cell carcinoma of the lung, favoring adenocarcinoma.  MRI of the head did not reveal any evidence of intracranial metastasis. Patient was treated with weekly carboplatin/paclitaxel + XRT. He received 7 cycles of chemotherapy, which was completed in December 2019.    December 2019:  Developed sepsis requiring admission to [facility].  Was found to have septic arthritis of his knee and was placed on IV antibiotics at home with daptomycin and ceftriaxone as well as rifampin.  The hemoglobin was 8.0 and dropped as low as 7.1.   February 2020:  At outpatient ID visit he collapsed  due to cardiorespiratory arrest requiring resuscitation.  Found to have acute pulmonary embolism of small to moderate size, but no significant right heart strain.  He had deep venous thrombosis of the right femoral vein, right popliteal vein and right posterior tibial vein.  He was placed on heparin IV and transitioned to apixaban 5 mg twice daily, which he continues.  He was also found to have aspiration pneumonia during that hospitalization.  CT chest revealed the spiculated mass in the posterior right upper lobe was slightly smaller measuring 3.8 cm in maximum diameter, decreased from 4.0 cm.  There was stable right hilar adenopathy measuring 1.4 cm.  There was a small right pleural effusion.   March 2020:  Began maintenance durvalumab in March 2020 and tolerated it well.   June 2020:  CT chest  revealed decrease in the size of the right upper lobe mass to 2.4 cm, with decrease in the pleural effusion.  Stable thoracic aortic aneurysm measuring 4.7 cm.   MRI brain negative for metastasis.  September 2020:  CT Chest-- improvement in the size of the right upper lobe pulmonary nodule from 2.4 cm to 1.9 cm, with a possible  right lower lobe pneumonia.   January 2021- CT chest  slight interval increase in dense, post treatment fibrotic consolidation and volume loss of the perihilar  right upper lobe, consistent with radiation fibrosis.  Previously seen clustered nodularity of the right lower lobe was essentially resolved.   February 2021- [facility] One testing of the peripheral blood show no reportable alterations with companion diagnostic claims.  Other biomarkers with potential clinical significance, included DNMT3A and TET2.    March 2021:  Completed 1 year of durvalumab therapy   April 2021:  CT chest post treatment changes about the right hilum measuring 6.9 x 2.5 cm, previously 7.4 x 3.4 cm.  Nodular changes in the left lung base developing along the margin of the aorta measuring 1.7 x 1.1 cm, more conspicuous than on the previous exam. The upper abdominal aortic aneurysm with mural thrombus is similar in appearance measuring 4.6 cm.    July 2021:  PET/CT large are of treated tumor/radiation fibrosis involving the right lower lobe.  No evidence of recurrence or metastasis Stable, severe vascular disease was seen.  Upper abdominal aorta 4.4 cm.    January 2022:  CT -- Stable right infrahilar soft tissue at the site of previous treatment, and decreased size of nodular focus at the left lung base now measuring 13 x 10 mm as compared to 17 x 10 mm, likely nodular scarring. Tortuous and aneurysmal appearance of the thoracoabdominal aorta with similar appearance.    September 2022- CT CAP -- Unchanged post treatment/post radiation appearance of the right lung.  No recurrent or metastatic disease. Redemonstrated aneurysmal aortic arch and descending thoracic aorta, maximum caliber of the distal arch approximately 4.6 x 4.2 cm and of the distal descending thoracic aorta approximately 5.2 x 5.2 cm.  November 21 2022:  Presented to [facility] ED via EMS with worsening SOB, weakness, fever, cough.  CT Chest/Abd with contrast-  No evidence of recurrent or metastatic carcinoma within the chest or abdomen. Stable 5.7 cm distal descending thoracic aortic aneurysm. Stable 4.6  cm  suprarenal abdominal aortic aneurysm  Patient was discharged home on Azithromycin and Cefdinir.     December 03 2022:  CT CAP- New collapse of the superior segment of the right lower lobe with dense parenchymal consolidation mild atelectasis versus scarring is noted within the posterior left lung base. Interval increase in size multilevel aneurysmal dilatation of the aorta both above and below the diaphragm status post aorto bi-iliac stent graft.   December 04 2022:  Scheduled follow up for lung cancer.  Has experienced at least two weeks of raspy cough. Minimal sputum production.  No fevers or chills since the evaluation in the ED.  Has completed the course of oral abx.  Patient is on O2 at 2 lit [place] continuously at baseline.  Has not increased setting on supplemental O2 but fatigues easily and has in fact been more tired recently.      Review of Systems - Oncology  MEDICAL HISTORY: Past Medical History:  Diagnosis Date   AAA (abdominal aortic aneurysm) without rupture (HCC) 08/03/2015   AKI (acute kidney injury) (HCC) 01/08/2018   Last Assessment & Plan:  Improved Nephrology consulted Likely 2/2 ATN and CIN in the setting of contrast at the outside hospital on 12/21, NSAIDs at the outside hospital, intra-operative hypotension, and entresto use. Avoid nephrotoxic medications(entresto/diuretic) Renally dose medications Strict I&O Monitor creatinine(1.68 today)     Anemia 01/17/2018   Last Assessment & Plan:  Due to chemotherapy and  anemia of chronic disease Iron panel shows anemia of chronic disease Follow up with PCP and oncologist   Arrhythmia    Asthma 09/23/2017   Atherosclerosis of native artery of both lower extremities (HCC) 08/03/2015   Bilateral carotid artery stenosis 08/03/2015   Cardiomyopathy (HCC) 11/18/2017   Chronic anticoagulation 06/05/2018   Chronic combined systolic and diastolic heart failure (HCC) 10/03/2017   Congestive heart failure (HCC) 09/23/2017   COPD (chronic obstructive  pulmonary disease) (HCC) 09/23/2017   Diabetes mellitus (HCC) 09/23/2017   Diarrhea 01/14/2018   Last Assessment & Plan:  C diff - negative  GIP - negative Resolved   Diastolic dysfunction 09/23/2017   Duodenal nodule 11/10/2014   Esophageal reflux 09/23/2017   Essential hypertension 11/10/2014   Last Assessment & Plan:  Controlled off of medication Follow up with PCP as an outpatient   Fatigue 09/23/2017   Gout 09/23/2017   Hematuria 01/14/2018   Last Assessment & Plan:  Need UA as outpatient.   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/06/2018   Insulin dependent type 2 diabetes mellitus (HCC) 11/10/2014   Last Assessment & Plan:  Glucose 109-140 Continue ISS Monitor glucose   Left ventricular dysfunction 10/03/2017   Lung cancer (HCC)    Lung mass 10/04/2017   Non-small cell lung cancer (HCC) 10/03/2017   Pulmonary emboli (HCC) 03/06/2018   Pulmonary embolism (HCC)    PVC's (premature ventricular contractions) 09/23/2017   PVD (peripheral vascular disease) (HCC) 11/10/2014   Septic arthritis of knee, left (HCC) 01/04/2018   Added automatically from request for surgery [id number]  Last Assessment & Plan:  Prosthetic knee infection S/p wash out Culture negative  Continue Dapto + Ceftriaxone + Rifampin for 4 weeks(currently day 7) ID was notified today prior to discharge He will need CBC, CMP, ESR, CRP weekly faxed to [phone]([facility] with ID) ID outpatient appointment is on 02/17/2017  Antibiotics administered by port by ho   Thrombocytopenia (HCC) 01/15/2018    Last Assessment & Plan:  Decreased to 78.  Due to chemotherapy Peripheral smear negative for schistocytes LDH 211(within normal limits), haptoglobin 329(elevated), Retic 1.7 B12=714, Folate=20, copper pending    SURGICAL HISTORY: Past Surgical History:  Procedure Laterality Date   ABDOMINAL AORTIC ANEURYSM REPAIR     AORTA - ILIAC ARTERY BYPASS GRAFT     BACK SURGERY     CATARACT EXTRACTION, BILATERAL     CHOLECYSTECTOMY     HEMICOLECTOMY     HERNIA REPAIR     KNEE SURGERY Left    replaced all the hardwear   PROSTATE SURGERY     REPLACEMENT TOTAL KNEE     STENT PLACEMENT VASCULAR ([facility] HX)     TONSILLECTOMY      SOCIAL HISTORY: Social History   Socioeconomic History   Marital status: Married    Spouse name: Not on file   Number of children: Not on file   Years of education: Not on file   Highest education level: Not on file  Occupational History   Not on  file  Tobacco Use   Smoking status: Former    Current packs/day: 0.00    Average packs/day: 2.5 packs/day for 40.0 years (100.0 ttl pk-yrs)    Types: Cigarettes    Start date: 66    Quit date: 2014    Years since quitting: 10.9    Passive exposure: Past   Smokeless tobacco: Never  Vaping Use   Vaping status: Never Used  Substance and Sexual Activity   Alcohol use: Not Currently   Drug use: Not Currently   Sexual activity: Not on file  Other Topics Concern   Not on file  Social History Narrative   Not on file   Social Determinants of Health   Financial Resource Strain: Not on file  Food Insecurity: Not on file  Transportation Needs: Not on file  Physical Activity: Not on file  Stress: Not on file  Social Connections: Not on file  Intimate Partner Violence: Not on file    FAMILY HISTORY Family History  Problem Relation Age of Onset   Arthritis Mother    Lung cancer Mother    Hypertension Mother    Arthritis Father    Heart attack Father    Prostate cancer Father    Hypertension Father     Hyperlipidemia Father    Heart disease Father    Alcohol abuse Father    Arthritis Brother    Lung cancer Brother     ALLERGIES:  is allergic to insulin glargine and quinolones.  MEDICATIONS:  Current Outpatient Medications  Medication Sig Dispense Refill   allopurinol (ZYLOPRIM) 300 MG tablet Take 300 mg by mouth daily.     apixaban (ELIQUIS) 5 MG TABS tablet Take 1 tablet (5 mg total) by mouth 2 (two) times daily. 180 tablet 3   carvedilol (COREG) 3.125 MG tablet Take 1 tablet (3.125 mg total) by mouth 2 (two) times daily. 180 tablet 2   celecoxib (CELEBREX) 200 MG capsule Take by mouth 2 (two) times daily.     ENTRESTO 97-103 MG TAKE 1 TABLET TWICE A DAY 180 tablet 3   fluticasone-salmeterol (ADVAIR HFA) 115-21 MCG/ACT inhaler Inhale 2 puffs into the lungs 2 (two) times daily.      gabapentin (NEURONTIN) 100 MG capsule Take 100 mg by mouth 2 (two) times daily.     metFORMIN (GLUCOPHAGE) 500 MG tablet Take 500 mg by mouth daily with breakfast. 2 tablets daily     omeprazole (PRILOSEC) 20 MG capsule Take 20 mg by mouth daily.     oxyCODONE (OXY IR/ROXICODONE) 5 MG immediate release tablet Take 1 tablet (5 mg total) by mouth every 6 (six) hours as needed for severe pain (pain score 7-10). 120 tablet 0   OXYGEN Inhale into the lungs continuous. 2 liters     PROAIR HFA 108 (90 Base) MCG/ACT inhaler Inhale 1 puff into the lungs every 6 (six) hours as needed.      simvastatin (ZOCOR) 40 MG tablet Take 1 tablet (40 mg total) by mouth daily. 90 tablet 3   SPIRIVA RESPIMAT 2.5 MCG/ACT AERS SMARTSIG:2 Puff(s) Via Inhaler Daily     tamsulosin (FLOMAX) 0.4 MG CAPS capsule Take 0.4 mg by mouth daily.     torsemide (DEMADEX) 20 MG tablet TAKE 1 TABLET DAILY, TAKE 1 EXTRA TABLET AT NOON ON MONDAY, WEDNESDAY, AND FRIDAY (Patient taking differently: Take 20 mg by mouth 2 (two) times daily. Takes 2 tablets  (40 mg) daily) 130 tablet 3   No current facility-administered medications for  this visit.     PHYSICAL EXAMINATION:  ECOG PERFORMANCE STATUS: 2 - Symptomatic, <50% confined to bed   Vitals:   12/04/22 1348  BP: 118/61  Pulse: 73  Resp: 16  SpO2: 97%    Filed Weights   12/04/22 1348  Weight: 228 lb 12.8 oz (103.8 kg)     Physical Exam Vitals and nursing note reviewed.  Constitutional:      General: He is not in acute distress.    Appearance: Normal appearance. He is obese. He is not diaphoretic.  HENT:     Head: Normocephalic and atraumatic.     Right Ear: External ear normal.     Left Ear: External ear normal.     Nose: Nose normal.  Eyes:     General: No scleral icterus.    Conjunctiva/sclera: Conjunctivae normal.     Pupils: Pupils are equal, round, and reactive to light.  Cardiovascular:     Rate and Rhythm: Normal rate and regular rhythm.     Heart sounds:     No friction rub. No gallop.  Pulmonary:     Effort: Pulmonary effort is normal. No respiratory distress.     Breath sounds: Normal breath sounds. No stridor.     Comments: Decreased BS RUL Abdominal:     Tenderness: There is no abdominal tenderness. There is no guarding or rebound.  Musculoskeletal:        General: No tenderness, deformity or signs of injury.     Cervical back: Normal range of motion and neck supple. No rigidity or tenderness.  Lymphadenopathy:     Head:     Right side of head: No submental, submandibular, tonsillar, preauricular, posterior auricular or occipital adenopathy.     Left side of head: No submental, submandibular, tonsillar, preauricular, posterior auricular or occipital adenopathy.     Cervical: No cervical adenopathy.     Right cervical: No superficial, deep or posterior cervical adenopathy.    Left cervical: No superficial, deep or posterior cervical adenopathy.     Upper Body:     Right upper body: No supraclavicular or axillary adenopathy.     Left upper body: No supraclavicular or axillary adenopathy.     Lower Body: No right inguinal adenopathy. No  left inguinal adenopathy.  Skin:    Coloration: Skin is not jaundiced.  Neurological:     General: No focal deficit present.     Mental Status: He is alert and oriented to person, place, and time.     Cranial Nerves: No cranial nerve deficit.  Psychiatric:        Mood and Affect: Mood normal.        Behavior: Behavior normal.        Thought Content: Thought content normal.        Judgment: Judgment normal.      LABORATORY DATA: I have personally reviewed the data as listed:  No visits with results within 1 Month(s) from this visit.  Latest known visit with results is:  Abstract on 06/04/2022  Component Date Value Ref Range Status   CEA 05/30/2022 2.2   Final    RADIOGRAPHIC STUDIES: I have personally reviewed the radiological images as listed and agree with the findings in the report  No results found.  ASSESSMENT/PLAN  82 y.o. male is here because of lung cancer.  Medical history notable for moderate emphysema, a distal descending thoracic aortic aneurysm measuring 4.4 cm, hypertension, congestive heart failure, diabetes mellitus, chronic kidney disease, multiple  aortic aneurysms, peripheral artery disease, and history of prostate cancer 11 years ago, CHF with ejection fraction 37%.  Peripheral neuropathy.  Stage IIIA adenocarcinoma of lung, RUL September 2019:  Diagnosed with stage IIIA (T3 N1 M0) non-small cell lung cancer after presenting with severe bradycardia and left chest discomfort.  Had a 6.2 cm mass in the RUL with 1 paratracheal node 1-2 cm.  Bronchial brushings revealed non-small cell carcinoma of the lung, favoring adenocarcinoma.  MRI brain negative for metastasis. Treated with weekly carboplatin/paclitaxel + XRT. He received 7 cycles of chemotherapy, which was completed in December 2019.     December 2019:  Admitted for septic arthritis of his knee.  Treated with daptomycin/ceftriaxone/ rifampin.    February 2020:  Cardiorespiratory arrest due to acute PE.  He  had extensive RLE DVT.  Received heparin and transitioned to apixaban 5 mg twice daily, which he continues.   Dx's with aspiration pneumonia during that hospitalization.  CT chest revealed RUL mass improved to 3.8 cm; right hilar adenopathy stable.    March 2020:  Began maintenance durvalumab   June 2020:  CT chest  RUL mass decreased to 2.4 cm, with decrease in the pleural effusion.  MRI brain negative.   September 2020:  CT Chest-- RUL mass now 1.9 cm, with a possible right lower lobe pneumonia.    January 2021- CT chest-- Post treatment fibrotic consolidation and volume loss in RUL.  Previous clustered nodularity of the right lower lobe was essentially resolved.   February 2021- Foundation One of peripheral blood--- no reportable alterations with companion diagnostic claims.  Markers with potential clinical significance, included DNMT3A and TET2.     March 2021:  Completed 1 year of durvalumab therapy   April 2021:  CT chest post treatment changes about the right hilum measuring 6.9 x 2.5 cm, previously 7.4 x 3.4 cm.  Nodular changes in the left lung base developing along the margin of the aorta measuring 1.7 x 1.1 cm, more conspicuous than on the previous exam. Upper abdominal aortic aneurysm with mural thrombus is similar in appearance measuring 4.6 cm.    July 2021:  PET/CT Treated tumor/radiation fibrosis involving the right lower lobe.  No recurrence or metastasis   January 2022:  CT -- Stable right infrahilar soft tissue at the site of previous treatment, and decreased size of nodular focus at the left lung base now measuring 13 x 10 mm as compared to 17 x 10 mm, likely nodular scarring.     September 2022- CT CAP -- Unchanged post treatment/post radiation appearance of the right lung.  No recurrent or metastatic disease. Redemonstrated aneurysmal aortic arch and descending thoracic aorta, maximum caliber of the distal arch approximately 4.6 x 4.2 cm and of the distal descending  thoracic aorta approximately 5.2 x 5.2 cm.  November 21 2022:  Presented to [facility] ED via EMS with worsening SOB, weakness, fever, cough.  CT Chest/Abd with contrast-  No evidence of recurrent or metastatic carcinoma within the chest or abdomen. Stable 5.7 cm distal descending thoracic aortic aneurysm. Stable 4.6  cm suprarenal abdominal aortic aneurysm  Patient was discharged home on Azithromycin and Cefdinir.     December 03 2022:  CT CAP- New collapse of the superior segment of the right lower lobe with dense parenchymal consolidation mild atelectasis versus scarring is noted within the posterior left lung base. Interval increase in size multilevel aneurysmal dilatation of the aorta both above and below the diaphragm status post aorto bi-iliac  stent graft.   December 04 2022:  CT findings and history are concerning for possible lung cancer recurrence.  At best patient may have a postobstructive pneumonia.  Referring to pulmonology for consideration of diagnostic; possibly therapeutic bronchoscopy        Cancer Staging  Non-small cell lung cancer [facility]) Staging form: Lung, AJCC 8th Edition - Clinical stage from 10/03/2017: Stage IIIA (cT3, cN1, cM0) - Signed by Dellia Beckwith, MD on 08/18/2020 Histopathologic type: Adenocarcinoma, NOS Stage prefix: Initial diagnosis Histologic grade (G): GX Histologic grading system: 4 grade system Laterality: Right Tumor size (mm): 62 Lymph-vascular invasion (LVI): Presence of LVI unknown/indeterminate Diagnostic confirmation: Positive histology Specimen type: Core Needle Biopsy Staged by: Managing physician Stage used in treatment planning: Yes National guidelines used in treatment planning: Yes Type of national guideline used in treatment planning: NCCN Staging comments: Not surgical candidate Concurrent chemoradiation with carbo/taxol, followed by 1 yr Durvalumab - Pathologic: No stage assigned - Unsigned    No problem-specific  Assessment & Plan notes found for this encounter.    Orders Placed This Encounter  Procedures   Ambulatory referral to Pulmonology    Referral Priority:   Routine    Referral Type:   Consultation    Referral Reason:   Specialty Services Required    Requested Specialty:   Pulmonary Disease    Number of Visits Requested:   1    45  minutes was spent in patient care.  This included time spent preparing to see the patient (e.g., review of tests), obtaining and/or reviewing separately obtained history, counseling and educating the patient/family/caregiver, ordering medications, tests, or procedures; documenting clinical information in the electronic or other health record, independently interpreting results and communicating results to the patient/family/caregiver as well as coordination of care.       All questions were answered. The patient knows to call the clinic with any problems, questions or concerns.  This note was electronically signed.    Loni Muse, MD  12/24/2022 10:24 AM

## 2022-12-17 DIAGNOSIS — J181 Lobar pneumonia, unspecified organism: Secondary | ICD-10-CM | POA: Diagnosis not present

## 2022-12-18 ENCOUNTER — Encounter: Payer: Self-pay | Admitting: Oncology

## 2022-12-24 ENCOUNTER — Encounter: Payer: Self-pay | Admitting: Hematology and Oncology

## 2022-12-24 ENCOUNTER — Other Ambulatory Visit: Payer: Self-pay

## 2022-12-24 DIAGNOSIS — R9389 Abnormal findings on diagnostic imaging of other specified body structures: Secondary | ICD-10-CM | POA: Insufficient documentation

## 2022-12-24 DIAGNOSIS — G8929 Other chronic pain: Secondary | ICD-10-CM

## 2022-12-24 HISTORY — DX: Abnormal findings on diagnostic imaging of other specified body structures: R93.89

## 2022-12-24 MED ORDER — OXYCODONE HCL 5 MG PO TABS: 5.0000 mg | ORAL_TABLET | Freq: Four times a day (QID) | ORAL | 0 refills | Status: DC | PRN

## 2022-12-25 ENCOUNTER — Inpatient Hospital Stay: Payer: Medicare Other | Attending: Oncology | Admitting: Oncology

## 2022-12-25 VITALS — BP 113/74 | HR 72 | Temp 97.8°F | Resp 16 | Ht 70.0 in | Wt 235.0 lb

## 2022-12-25 DIAGNOSIS — Z86711 Personal history of pulmonary embolism: Secondary | ICD-10-CM | POA: Insufficient documentation

## 2022-12-25 DIAGNOSIS — Z79899 Other long term (current) drug therapy: Secondary | ICD-10-CM | POA: Insufficient documentation

## 2022-12-25 DIAGNOSIS — Z7901 Long term (current) use of anticoagulants: Secondary | ICD-10-CM | POA: Diagnosis not present

## 2022-12-25 DIAGNOSIS — J189 Pneumonia, unspecified organism: Secondary | ICD-10-CM

## 2022-12-25 DIAGNOSIS — C3491 Malignant neoplasm of unspecified part of right bronchus or lung: Secondary | ICD-10-CM

## 2022-12-25 DIAGNOSIS — Z8546 Personal history of malignant neoplasm of prostate: Secondary | ICD-10-CM | POA: Insufficient documentation

## 2022-12-25 DIAGNOSIS — R9389 Abnormal findings on diagnostic imaging of other specified body structures: Secondary | ICD-10-CM | POA: Diagnosis not present

## 2022-12-25 DIAGNOSIS — C3410 Malignant neoplasm of upper lobe, unspecified bronchus or lung: Secondary | ICD-10-CM | POA: Insufficient documentation

## 2022-12-25 DIAGNOSIS — Z87891 Personal history of nicotine dependence: Secondary | ICD-10-CM | POA: Insufficient documentation

## 2022-12-25 HISTORY — DX: Pneumonia, unspecified organism: J18.9

## 2022-12-25 NOTE — Progress Notes (Signed)
[facility] Initial Visit:  Patient Care Team: Paulina Fusi, MD as PCP - General (Internal Medicine) Dulce Sellar Iline Oven, MD as PCP - Cardiology (Cardiology) Dellia Beckwith, MD as Consulting Physician (Oncology)  CHIEF COMPLAINTS/PURPOSE OF CONSULTATION:  Oncology History  Non-small cell lung cancer (HCC)  10/03/2017 Initial Diagnosis   Non-small cell lung cancer (HCC)   10/03/2017 Cancer Staging   Staging form: Lung, AJCC 8th Edition - Clinical stage from 10/03/2017: Stage IIIA (cT3, cN1, cM0) - Signed by Dellia Beckwith, MD on 08/18/2020 Histopathologic type: Adenocarcinoma, NOS Stage prefix: Initial diagnosis Histologic grade (G): GX Histologic grading system: 4 grade system Laterality: Right Tumor size (mm): 62 Lymph-vascular invasion (LVI): Presence of LVI unknown/indeterminate Diagnostic confirmation: Positive histology Specimen type: Core Needle Biopsy Staged by: Managing physician Stage used in treatment planning: Yes National guidelines used in treatment planning: Yes Type of national guideline used in treatment planning: NCCN Staging comments: Not surgical candidate Concurrent chemoradiation with carbo/taxol, followed by 1 yr Durvalumab     HISTORY OF PRESENTING ILLNESS: John Maddox 82 y.o. male is here because of lung cancer Medical history notable for moderate emphysema, a distal descending thoracic aortic aneurysm measuring 4.4 cm, hypertension, congestive heart failure, diabetes mellitus, chronic kidney disease, multiple aortic aneurysms, peripheral artery disease, and history of prostate cancer 11 years ago, CHF with ejection fraction 37%.  Peripheral neuropathy.   September 2019:  Diagnosed with stage IIIA (T3 N1 M0) non-small cell lung cancer.  Presented with severe bradycardia and left chest discomfort.  Found to have a 6.2 cm mass in the right upper lobe with 1 paratracheal node measuring 1-2 cm.  Bronchial brushings and  washings revealed malignant cells consistent with non-small cell carcinoma of the lung, favoring adenocarcinoma.  MRI of the head did not reveal any evidence of intracranial metastasis. Patient was treated with weekly carboplatin/paclitaxel + XRT. He received 7 cycles of chemotherapy, which was completed in December 2019.    December 2019:  Developed sepsis requiring admission to [facility].  Was found to have septic arthritis of his knee and was placed on IV antibiotics at home with daptomycin and ceftriaxone as well as rifampin.  The hemoglobin was 8.0 and dropped as low as 7.1.   February 2020:  At outpatient ID visit he collapsed  due to cardiorespiratory arrest requiring resuscitation.  Found to have acute pulmonary embolism of small to moderate size, but no significant right heart strain.  He had deep venous thrombosis of the right femoral vein, right popliteal vein and right posterior tibial vein.  He was placed on heparin IV and transitioned to apixaban 5 mg twice daily, which he continues.  He was also found to have aspiration pneumonia during that hospitalization.  CT chest revealed the spiculated mass in the posterior right upper lobe was slightly smaller measuring 3.8 cm in maximum diameter, decreased from 4.0 cm.  There was stable right hilar adenopathy measuring 1.4 cm.  There was a small right pleural effusion.   March 2020:  Began maintenance durvalumab in March 2020 and tolerated it well.   June 2020:  CT chest  revealed decrease in the size of the right upper lobe mass to 2.4 cm, with decrease in the pleural effusion.  Stable thoracic aortic aneurysm measuring 4.7 cm.   MRI brain negative for metastasis.  September 2020:  CT Chest-- improvement in the size of the right upper lobe pulmonary nodule from 2.4 cm to 1.9 cm, with a possible  right lower lobe pneumonia.   January 2021- CT chest  slight interval increase in dense, post treatment fibrotic consolidation and volume loss of the perihilar  right upper lobe, consistent with radiation fibrosis.  Previously seen clustered nodularity of the right lower lobe was essentially resolved.   February 2021- [facility] One testing of the peripheral blood show no reportable alterations with companion diagnostic claims.  Other biomarkers with potential clinical significance, included DNMT3A and TET2.    March 2021:  Completed 1 year of durvalumab therapy   April 2021:  CT chest post treatment changes about the right hilum measuring 6.9 x 2.5 cm, previously 7.4 x 3.4 cm.  Nodular changes in the left lung base developing along the margin of the aorta measuring 1.7 x 1.1 cm, more conspicuous than on the previous exam. The upper abdominal aortic aneurysm with mural thrombus is similar in appearance measuring 4.6 cm.    July 2021:  PET/CT large are of treated tumor/radiation fibrosis involving the right lower lobe.  No evidence of recurrence or metastasis Stable, severe vascular disease was seen.  Upper abdominal aorta 4.4 cm.    January 2022:  CT -- Stable right infrahilar soft tissue at the site of previous treatment, and decreased size of nodular focus at the left lung base now measuring 13 x 10 mm as compared to 17 x 10 mm, likely nodular scarring. Tortuous and aneurysmal appearance of the thoracoabdominal aorta with similar appearance.    September 2022- CT CAP -- Unchanged post treatment/post radiation appearance of the right lung.  No recurrent or metastatic disease. Redemonstrated aneurysmal aortic arch and descending thoracic aorta, maximum caliber of the distal arch approximately 4.6 x 4.2 cm and of the distal descending thoracic aorta approximately 5.2 x 5.2 cm.  November 21 2022:  Presented to [facility] ED via EMS with worsening SOB, weakness, fever, cough.  CT Chest/Abd with contrast-  No evidence of recurrent or metastatic carcinoma within the chest or abdomen. Stable 5.7 cm distal descending thoracic aortic aneurysm. Stable 4.6  cm  suprarenal abdominal aortic aneurysm  Patient was discharged home on Azithromycin and Cefdinir.     December 03 2022:  CT CAP- New collapse of the superior segment of the right lower lobe with dense parenchymal consolidation mild atelectasis versus scarring is noted within the posterior left lung base. Interval increase in size multilevel aneurysmal dilatation of the aorta both above and below the diaphragm status post aorto bi-iliac stent graft.   December 04 2022:    Has experienced at least two weeks of raspy cough. Minimal sputum production.  No fevers or chills since the evaluation in the ED.  Has completed the course of oral abx.  Patient is on O2 at 2 lit [place] continuously at baseline.  Has not increased setting on supplemental O2 but fatigues easily and has in fact been more tired recently.      December 17 2022:  Pulmonary follow up.  Placed on Augmentin with plans to repeat CT scan in 3 to 4 weeks.  If no improvement then brochoscopy  December 25 2022:  Scheduled follow up for lung cancer.   Feels a bit better with change of Abx and with use of flutter valve.   Not smoking and in fact has been quit for quite awhile.  Remains on O2 at 2 liters.    December 27 2022:  Repeat CT scan  January 14 2023:  Pulmonary follow up visit.    Review of Systems -  Oncology  MEDICAL HISTORY: Past Medical History:  Diagnosis Date   AAA (abdominal aortic aneurysm) without rupture (HCC) 08/03/2015   AKI (acute kidney injury) (HCC) 01/08/2018   Last Assessment & Plan:  Improved Nephrology consulted Likely 2/2 ATN and CIN in the setting of contrast at the outside hospital on 12/21, NSAIDs at the outside hospital, intra-operative hypotension, and entresto use. Avoid nephrotoxic medications(entresto/diuretic) Renally dose medications Strict I&O Monitor creatinine(1.68 today)     Anemia 01/17/2018   Last Assessment & Plan:  Due to chemotherapy and anemia of chronic disease Iron panel shows anemia of chronic  disease Follow up with PCP and oncologist   Arrhythmia    Asthma 09/23/2017   Atherosclerosis of native artery of both lower extremities (HCC) 08/03/2015   Bilateral carotid artery stenosis 08/03/2015   Cardiomyopathy (HCC) 11/18/2017   Chronic anticoagulation 06/05/2018   Chronic combined systolic and diastolic heart failure (HCC) 10/03/2017   Congestive heart failure (HCC) 09/23/2017   COPD (chronic obstructive pulmonary disease) (HCC) 09/23/2017   Diabetes mellitus (HCC) 09/23/2017   Diarrhea 01/14/2018   Last Assessment & Plan:  C diff - negative  GIP - negative Resolved   Diastolic dysfunction 09/23/2017   Duodenal nodule 11/10/2014   Esophageal reflux 09/23/2017   Essential hypertension 11/10/2014   Last Assessment & Plan:  Controlled off of medication Follow up with PCP as an outpatient   Fatigue 09/23/2017   Gout 09/23/2017   Hematuria 01/14/2018   Last Assessment & Plan:  Need UA as outpatient.   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/06/2018   Insulin dependent type 2 diabetes mellitus (HCC) 11/10/2014   Last Assessment & Plan:  Glucose 109-140 Continue ISS Monitor glucose   Left ventricular dysfunction 10/03/2017   Lung cancer (HCC)    Lung mass 10/04/2017   Non-small cell lung cancer (HCC) 10/03/2017   Pulmonary emboli (HCC) 03/06/2018   Pulmonary embolism (HCC)    PVC's (premature ventricular contractions) 09/23/2017   PVD (peripheral vascular disease) (HCC) 11/10/2014   Septic arthritis of knee, left (HCC) 01/04/2018   Added automatically  from request for surgery [id number]  Last Assessment & Plan:  Prosthetic knee infection S/p wash out Culture negative  Continue Dapto + Ceftriaxone + Rifampin for 4 weeks(currently day 7) ID was notified today prior to discharge He will need CBC, CMP, ESR, CRP weekly faxed to [phone]([facility] with ID) ID outpatient appointment is on 02/17/2017  Antibiotics administered by port by ho   Thrombocytopenia (HCC) 01/15/2018   Last Assessment & Plan:  Decreased to 78.  Due to chemotherapy Peripheral smear negative for schistocytes LDH 211(within normal limits), haptoglobin 329(elevated), Retic 1.7 B12=714, Folate=20, copper pending    SURGICAL HISTORY: Past Surgical History:  Procedure Laterality Date   ABDOMINAL AORTIC ANEURYSM REPAIR     AORTA - ILIAC ARTERY BYPASS GRAFT     BACK SURGERY     CATARACT EXTRACTION, BILATERAL     CHOLECYSTECTOMY     HEMICOLECTOMY     HERNIA REPAIR  KNEE SURGERY Left    replaced all the hardwear   PROSTATE SURGERY     REPLACEMENT TOTAL KNEE     STENT PLACEMENT VASCULAR ([facility] HX)     TONSILLECTOMY      SOCIAL HISTORY: Social History   Socioeconomic History   Marital status: Married    Spouse name: Not on file   Number of children: Not on file   Years of education: Not on file   Highest education level: Not on file  Occupational History   Not on file  Tobacco Use   Smoking status: Former    Current packs/day: 0.00    Average packs/day: 2.5 packs/day for 40.0 years (100.0 ttl pk-yrs)    Types: Cigarettes    Start date: 49    Quit date: 2014    Years since quitting: 10.9    Passive exposure: Past   Smokeless tobacco: Never  Vaping Use   Vaping status: Never Used  Substance and Sexual Activity   Alcohol use: Not Currently   Drug use: Not Currently   Sexual activity: Not on file  Other Topics Concern   Not on file  Social History Narrative   Not on file   Social Determinants of Health   Financial Resource Strain: Not on file  Food Insecurity:  Low Risk  (12/17/2022)   Received from [facility]   Hunger Vital Sign    Worried About Running Out of Food in the Last Year: Never true    Ran Out of Food in the Last Year: Never true  Transportation Needs: No Transportation Needs (12/17/2022)   Received from [facility]    In the past 12 months, has lack of reliable transportation kept you from medical appointments, meetings, work or from getting things needed for daily living? : No  Physical Activity: Not on file  Stress: Not on file  Social Connections: Not on file  Intimate Partner Violence: Not on file    FAMILY HISTORY Family History  Problem Relation Age of Onset   Arthritis Mother    Lung cancer Mother    Hypertension Mother    Arthritis Father    Heart attack Father    Prostate cancer Father    Hypertension Father    Hyperlipidemia Father    Heart disease Father    Alcohol abuse Father    Arthritis Brother    Lung cancer Brother     ALLERGIES:  is allergic to insulin glargine and quinolones.  MEDICATIONS:  Current Outpatient Medications  Medication Sig Dispense Refill   allopurinol (ZYLOPRIM) 300 MG tablet Take 300 mg by mouth daily.     apixaban (ELIQUIS) 5 MG TABS tablet Take 1 tablet (5 mg total) by mouth 2 (two) times daily. 180 tablet 3   carvedilol (COREG) 3.125 MG tablet Take 1 tablet (3.125 mg total) by mouth 2 (two) times daily. 180 tablet 2   celecoxib (CELEBREX) 200 MG capsule Take by mouth 2 (two) times daily.     ENTRESTO 97-103 MG TAKE 1 TABLET TWICE A DAY 180 tablet 3   fluticasone-salmeterol (ADVAIR HFA) 115-21 MCG/ACT inhaler Inhale 2 puffs into the lungs 2 (two) times daily.      gabapentin (NEURONTIN) 100 MG capsule Take 100 mg by mouth 2 (two) times daily.     metFORMIN (GLUCOPHAGE) 500 MG tablet Take 500 mg by mouth daily with breakfast. 2 tablets daily     omeprazole (PRILOSEC) 20 MG capsule Take 20 mg by mouth  daily.     oxyCODONE (OXY IR/ROXICODONE) 5 MG immediate  release tablet Take 1 tablet (5 mg total) by mouth every 6 (six) hours as needed for severe pain (pain score 7-10). 120 tablet 0   OXYGEN Inhale into the lungs continuous. 2 liters     PROAIR HFA 108 (90 Base) MCG/ACT inhaler Inhale 1 puff into the lungs every 6 (six) hours as needed.      simvastatin (ZOCOR) 40 MG tablet Take 1 tablet (40 mg total) by mouth daily. 90 tablet 3   SPIRIVA RESPIMAT 2.5 MCG/ACT AERS SMARTSIG:2 Puff(s) Via Inhaler Daily     tamsulosin (FLOMAX) 0.4 MG CAPS capsule Take 0.4 mg by mouth daily.     torsemide (DEMADEX) 20 MG tablet TAKE 1 TABLET DAILY, TAKE 1 EXTRA TABLET AT NOON ON MONDAY, WEDNESDAY, AND FRIDAY (Patient taking differently: Take 20 mg by mouth 2 (two) times daily. Takes 2 tablets  (40 mg) daily) 130 tablet 3   No current facility-administered medications for this visit.    PHYSICAL EXAMINATION:  ECOG PERFORMANCE STATUS: 2 - Symptomatic, <50% confined to bed   There were no vitals filed for this visit.   There were no vitals filed for this visit.    Physical Exam Vitals and nursing note reviewed.  Constitutional:      General: He is not in acute distress.    Appearance: Normal appearance. He is obese. He is not diaphoretic.  HENT:     Head: Normocephalic and atraumatic.     Right Ear: External ear normal.     Left Ear: External ear normal.     Nose: Nose normal.  Eyes:     General: No scleral icterus.    Conjunctiva/sclera: Conjunctivae normal.     Pupils: Pupils are equal, round, and reactive to light.  Cardiovascular:     Rate and Rhythm: Normal rate and regular rhythm.     Heart sounds:     No friction rub. No gallop.  Pulmonary:     Effort: Pulmonary effort is normal. No respiratory distress.     Breath sounds: Normal breath sounds. No stridor.     Comments: Decreased BS RUL Abdominal:     Tenderness: There is no abdominal tenderness. There is no guarding or rebound.  Musculoskeletal:        General: No tenderness, deformity  or signs of injury.     Cervical back: Normal range of motion and neck supple. No rigidity or tenderness.  Lymphadenopathy:     Head:     Right side of head: No submental, submandibular, tonsillar, preauricular, posterior auricular or occipital adenopathy.     Left side of head: No submental, submandibular, tonsillar, preauricular, posterior auricular or occipital adenopathy.     Cervical: No cervical adenopathy.     Right cervical: No superficial, deep or posterior cervical adenopathy.    Left cervical: No superficial, deep or posterior cervical adenopathy.     Upper Body:     Right upper body: No supraclavicular or axillary adenopathy.     Left upper body: No supraclavicular or axillary adenopathy.     Lower Body: No right inguinal adenopathy. No left inguinal adenopathy.  Skin:    Coloration: Skin is not jaundiced.  Neurological:     General: No focal deficit present.     Mental Status: He is alert and oriented to person, place, and time.     Cranial Nerves: No cranial nerve deficit.  Psychiatric:  Mood and Affect: Mood normal.        Behavior: Behavior normal.        Thought Content: Thought content normal.        Judgment: Judgment normal.      LABORATORY DATA: I have personally reviewed the data as listed:  Scanned Document on 12/18/2022  Component Date Value Ref Range Status   EGFR 12/03/2022 60.0   Final   Abstracted by HIM    RADIOGRAPHIC STUDIES: I have personally reviewed the radiological images as listed and agree with the findings in the report  No results found.  ASSESSMENT/PLAN  82 y.o. male is here because of lung cancer.  Medical history notable for moderate emphysema, a distal descending thoracic aortic aneurysm measuring 4.4 cm, hypertension, congestive heart failure, diabetes mellitus, chronic kidney disease, multiple aortic aneurysms, peripheral artery disease, and history of prostate cancer 11 years ago, CHF with ejection fraction 37%.   Peripheral neuropathy.  Stage IIIA adenocarcinoma of lung, RUL September 2019:  Diagnosed with stage IIIA (T3 N1 M0) non-small cell lung cancer after presenting with severe bradycardia and left chest discomfort.  Had a 6.2 cm mass in the RUL with 1 paratracheal node 1-2 cm.  Bronchial brushings revealed non-small cell carcinoma of the lung, favoring adenocarcinoma.  MRI brain negative for metastasis. Treated with weekly carboplatin/paclitaxel + XRT. He received 7 cycles of chemotherapy, which was completed in December 2019.     December 2019:  Admitted for septic arthritis of his knee.  Treated with daptomycin/ceftriaxone/ rifampin.    February 2020:  Cardiorespiratory arrest due to acute PE.  He had extensive RLE DVT.  Received heparin and transitioned to apixaban 5 mg twice daily, which he continues.   Dx's with aspiration pneumonia during that hospitalization.  CT chest revealed RUL mass improved to 3.8 cm; right hilar adenopathy stable.    March 2020:  Began maintenance durvalumab   June 2020:  CT chest  RUL mass decreased to 2.4 cm, with decrease in the pleural effusion.  MRI brain negative.   September 2020:  CT Chest-- RUL mass now 1.9 cm, with a possible right lower lobe pneumonia.    January 2021- CT chest-- Post treatment fibrotic consolidation and volume loss in RUL.  Previous clustered nodularity of the right lower lobe was essentially resolved.   February 2021- [facility] One of peripheral blood--- no reportable alterations with companion diagnostic claims.  Markers with potential clinical significance, included DNMT3A and TET2.     March 2021:  Completed 1 year of durvalumab therapy   April 2021:  CT chest post treatment changes about the right hilum measuring 6.9 x 2.5 cm, previously 7.4 x 3.4 cm.  Nodular changes in the left lung base developing along the margin of the aorta measuring 1.7 x 1.1 cm, more conspicuous than on the previous exam. Upper abdominal aortic aneurysm with  mural thrombus is similar in appearance measuring 4.6 cm.    July 2021:  PET/CT Treated tumor/radiation fibrosis involving the right lower lobe.  No recurrence or metastasis   January 2022:  CT -- Stable right infrahilar soft tissue at the site of previous treatment, and decreased size of nodular focus at the left lung base now measuring 13 x 10 mm as compared to 17 x 10 mm, likely nodular scarring.     September 2022- CT CAP -- Unchanged post treatment/post radiation appearance of the right lung.  No recurrent or metastatic disease. Redemonstrated aneurysmal aortic arch and descending thoracic aorta,  maximum caliber of the distal arch approximately 4.6 x 4.2 cm and of the distal descending thoracic aorta approximately 5.2 x 5.2 cm.  November 21 2022:  Presented to [facility] ED via EMS with worsening SOB, weakness, fever, cough.  CT Chest/Abd with contrast-  No evidence of recurrent or metastatic carcinoma within the chest or abdomen. Stable 5.7 cm distal descending thoracic aortic aneurysm. Stable 4.6  cm suprarenal abdominal aortic aneurysm  Patient was discharged home on Azithromycin and Cefdinir.     December 03 2022:  CT CAP- New collapse of the superior segment of the right lower lobe with dense parenchymal consolidation mild atelectasis versus scarring is noted within the posterior left lung base. Interval increase in size multilevel aneurysmal dilatation of the aorta both above and below the diaphragm status post aorto bi-iliac stent graft.   December 04 2022:  CT findings and history are concerning for possible lung cancer recurrence.  At best patient may have a postobstructive pneumonia.  Referring to pulmonology for consideration of diagnostic; possibly therapeutic bronchoscopy    Postobstructive pneumonia  December 17 2022:  Pulmonary follow up.  Placed on Augmentin with plans to repeat CT scan in 3 to 4 weeks.  If no improvement then brochoscopy   December 25 2022:  Feels a bit  better with change of Abx and with use of flutter valve.   Remains on O2 at 2 liters.     December 27 2022:  Repeat CT scan   January 14 2023:  Pulmonary follow up visit.      Cancer Staging  Non-small cell lung cancer [facility]) Staging form: Lung, AJCC 8th Edition - Clinical stage from 10/03/2017: Stage IIIA (cT3, cN1, cM0) - Signed by Dellia Beckwith, MD on 08/18/2020 Histopathologic type: Adenocarcinoma, NOS Stage prefix: Initial diagnosis Histologic grade (G): GX Histologic grading system: 4 grade system Laterality: Right Tumor size (mm): 62 Lymph-vascular invasion (LVI): Presence of LVI unknown/indeterminate Diagnostic confirmation: Positive histology Specimen type: Core Needle Biopsy Staged by: Managing physician Stage used in treatment planning: Yes National guidelines used in treatment planning: Yes Type of national guideline used in treatment planning: NCCN Staging comments: Not surgical candidate Concurrent chemoradiation with carbo/taxol, followed by 1 yr Durvalumab - Pathologic: No stage assigned - Unsigned    No problem-specific Assessment & Plan notes found for this encounter.    No orders of the defined types were placed in this encounter.   30  minutes was spent in patient care.  This included time spent preparing to see the patient (e.g., review of tests), obtaining and/or reviewing separately obtained history, counseling and educating the patient/family/caregiver, ordering medications, tests, or procedures; documenting clinical information in the electronic or other health record, independently interpreting results and communicating results to the patient/family/caregiver as well as coordination of care.       All questions were answered. The patient knows to call the clinic with any problems, questions or concerns.  This note was electronically signed.    Loni Muse, MD  12/25/2022 12:34 PM

## 2022-12-27 DIAGNOSIS — R918 Other nonspecific abnormal finding of lung field: Secondary | ICD-10-CM | POA: Diagnosis not present

## 2022-12-27 DIAGNOSIS — J181 Lobar pneumonia, unspecified organism: Secondary | ICD-10-CM | POA: Diagnosis not present

## 2022-12-27 DIAGNOSIS — J189 Pneumonia, unspecified organism: Secondary | ICD-10-CM | POA: Diagnosis not present

## 2022-12-27 DIAGNOSIS — I7122 Aneurysm of the aortic arch, without rupture: Secondary | ICD-10-CM | POA: Diagnosis not present

## 2023-01-14 DIAGNOSIS — J181 Lobar pneumonia, unspecified organism: Secondary | ICD-10-CM | POA: Diagnosis not present

## 2023-01-23 ENCOUNTER — Other Ambulatory Visit: Payer: Self-pay

## 2023-01-23 DIAGNOSIS — G8929 Other chronic pain: Secondary | ICD-10-CM

## 2023-01-23 MED ORDER — OXYCODONE HCL 5 MG PO TABS: 5.0000 mg | ORAL_TABLET | Freq: Four times a day (QID) | ORAL | 0 refills | Status: DC | PRN

## 2023-01-29 ENCOUNTER — Inpatient Hospital Stay: Payer: Medicare Other | Attending: Oncology | Admitting: Oncology

## 2023-01-29 ENCOUNTER — Inpatient Hospital Stay: Payer: Medicare Other

## 2023-01-29 VITALS — BP 130/82 | HR 69 | Temp 98.5°F | Resp 16 | Ht 70.0 in | Wt 236.7 lb

## 2023-01-29 DIAGNOSIS — C3491 Malignant neoplasm of unspecified part of right bronchus or lung: Secondary | ICD-10-CM

## 2023-01-29 DIAGNOSIS — Z7951 Long term (current) use of inhaled steroids: Secondary | ICD-10-CM | POA: Diagnosis not present

## 2023-01-29 DIAGNOSIS — Z86711 Personal history of pulmonary embolism: Secondary | ICD-10-CM | POA: Insufficient documentation

## 2023-01-29 DIAGNOSIS — Z923 Personal history of irradiation: Secondary | ICD-10-CM | POA: Diagnosis not present

## 2023-01-29 DIAGNOSIS — Z79899 Other long term (current) drug therapy: Secondary | ICD-10-CM | POA: Diagnosis not present

## 2023-01-29 DIAGNOSIS — R9389 Abnormal findings on diagnostic imaging of other specified body structures: Secondary | ICD-10-CM | POA: Diagnosis not present

## 2023-01-29 DIAGNOSIS — C3410 Malignant neoplasm of upper lobe, unspecified bronchus or lung: Secondary | ICD-10-CM | POA: Diagnosis not present

## 2023-01-29 DIAGNOSIS — Z87891 Personal history of nicotine dependence: Secondary | ICD-10-CM | POA: Diagnosis not present

## 2023-01-29 DIAGNOSIS — Z8546 Personal history of malignant neoplasm of prostate: Secondary | ICD-10-CM | POA: Insufficient documentation

## 2023-01-29 DIAGNOSIS — J189 Pneumonia, unspecified organism: Secondary | ICD-10-CM | POA: Diagnosis not present

## 2023-01-29 NOTE — Progress Notes (Signed)
 [facility] Cancer Initial Visit:  Patient Care Team: [name], MD as PCP - General (Internal Medicine) Monetta Redell PARAS, MD as PCP - Cardiology (Cardiology) Cornelius Wanda DEL, MD as Consulting Physician (Oncology)  CHIEF COMPLAINTS/PURPOSE OF CONSULTATION:  Oncology History  Non-small cell lung cancer (HCC)  10/03/2017 Initial Diagnosis   Non-small cell lung cancer (HCC)   10/03/2017 Cancer Staging   Staging form: Lung, AJCC 8th Edition - Clinical stage from 10/03/2017: Stage IIIA (cT3, cN1, cM0) - Signed by Cornelius Wanda DEL, MD on 08/18/2020 Histopathologic type: Adenocarcinoma, NOS Stage prefix: Initial diagnosis Histologic grade (G): GX Histologic grading system: 4 grade system Laterality: Right Tumor size (mm): 62 Lymph-vascular invasion (LVI): Presence of LVI unknown/indeterminate Diagnostic confirmation: Positive histology Specimen type: Core Needle Biopsy Staged by: Managing physician Stage used in treatment planning: Yes National guidelines used in treatment planning: Yes Type of national guideline used in treatment planning: NCCN Staging comments: Not surgical candidate Concurrent chemoradiation with carbo/taxol , followed by 1 yr Durvalumab      HISTORY OF PRESENTING ILLNESS: John Maddox 83 y.o. male is here because of lung cancer Medical history notable for moderate emphysema, a distal descending thoracic aortic aneurysm measuring 4.4 cm, hypertension, congestive heart failure, diabetes mellitus, chronic kidney disease, multiple aortic aneurysms, peripheral artery disease, and history of prostate cancer 11 years ago, CHF with ejection fraction 37%.  Peripheral neuropathy.   September 2019:  Diagnosed with stage IIIA (T3 N1 M0) non-small cell lung cancer.  Presented with severe bradycardia and left chest discomfort.  Found to have a 6.2 cm mass in the right upper lobe with 1 paratracheal node measuring 1-2 cm.  Bronchial brushings and  washings revealed malignant cells consistent with non-small cell carcinoma of the lung, favoring adenocarcinoma.  MRI of the head did not reveal any evidence of intracranial metastasis. Patient was treated with weekly carboplatin /paclitaxel  + XRT. He received 7 cycles of chemotherapy, which was completed in December 2019.    December 2019:  Developed sepsis requiring admission to [facility].  Was found to have septic arthritis of his knee and was placed on IV antibiotics at home with daptomycin  and ceftriaxone  as well as rifampin.  The hemoglobin was 8.0 and dropped as low as 7.1.   February 2020:  At outpatient ID visit he collapsed  due to cardiorespiratory arrest requiring resuscitation.  Found to have acute pulmonary embolism of small to moderate size, but no significant right heart strain.  He had deep venous thrombosis of the right femoral vein, right popliteal vein and right posterior tibial vein.  He was placed on heparin  IV and transitioned to apixaban  5 mg twice daily, which he continues.  He was also found to have aspiration pneumonia during that hospitalization.  CT chest revealed the spiculated mass in the posterior right upper lobe was slightly smaller measuring 3.8 cm in maximum diameter, decreased from 4.0 cm.  There was stable right hilar adenopathy measuring 1.4 cm.  There was a small right pleural effusion.   March 2020:  Began maintenance durvalumab  in March 2020 and tolerated it well.   June 2020:  CT chest  revealed decrease in the size of the right upper lobe mass to 2.4 cm, with decrease in the pleural effusion.  Stable thoracic aortic aneurysm measuring 4.7 cm.   MRI brain negative for metastasis.  September 2020:  CT Chest-- improvement in the size of the right upper lobe pulmonary nodule from 2.4 cm to 1.9 cm, with a possible  right lower lobe pneumonia.   January 2021- CT chest  slight interval increase in dense, post treatment fibrotic consolidation and volume loss of the perihilar  right upper lobe, consistent with radiation fibrosis.  Previously seen clustered nodularity of the right lower lobe was essentially resolved.   February 2021- [facility] One testing of the peripheral blood show no reportable alterations with companion diagnostic claims.  Other biomarkers with potential clinical significance, included DNMT3A and TET2.    March 2021:  Completed 1 year of durvalumab  therapy   April 2021:  CT chest post treatment changes about the right hilum measuring 6.9 x 2.5 cm, previously 7.4 x 3.4 cm.  Nodular changes in the left lung base developing along the margin of the aorta measuring 1.7 x 1.1 cm, more conspicuous than on the previous exam. The upper abdominal aortic aneurysm with mural thrombus is similar in appearance measuring 4.6 cm.    July 2021:  PET/CT large are of treated tumor/radiation fibrosis involving the right lower lobe.  No evidence of recurrence or metastasis Stable, severe vascular disease was seen.  Upper abdominal aorta 4.4 cm.    January 2022:  CT -- Stable right infrahilar soft tissue at the site of previous treatment, and decreased size of nodular focus at the left lung base now measuring 13 x 10 mm as compared to 17 x 10 mm, likely nodular scarring. Tortuous and aneurysmal appearance of the thoracoabdominal aorta with similar appearance.    September 2022- CT CAP -- Unchanged post treatment/post radiation appearance of the right lung.  No recurrent or metastatic disease. Redemonstrated aneurysmal aortic arch and descending thoracic aorta, maximum caliber of the distal arch approximately 4.6 x 4.2 cm and of the distal descending thoracic aorta approximately 5.2 x 5.2 cm.  November 21 2022:  Presented to [facility] ED via EMS with worsening SOB, weakness, fever, cough.  CT Chest/Abd with contrast-  No evidence of recurrent or metastatic carcinoma within the chest or abdomen. Stable 5.7 cm distal descending thoracic aortic aneurysm. Stable 4.6  cm  suprarenal abdominal aortic aneurysm  Patient was discharged home on Azithromycin and Cefdinir.     December 03 2022:  CT CAP- New collapse of the superior segment of the right lower lobe with dense parenchymal consolidation mild atelectasis versus scarring is noted within the posterior left lung base. Interval increase in size multilevel aneurysmal dilatation of the aorta both above and below the diaphragm status post aorto bi-iliac stent graft.   December 04 2022:    Has experienced at least two weeks of raspy cough. Minimal sputum production.  No fevers or chills since the evaluation in the ED.  Has completed the course of oral abx.  Patient is on O2 at 2 lit [place] continuously at baseline.  Has not increased setting on supplemental O2 but fatigues easily and has in fact been more tired recently.      December 17 2022:  Pulmonary follow up.  Placed on Augmentin with plans to repeat CT scan in 3 to 4 weeks.  If no improvement then brochoscopy  December 25 2022:  Feels a bit better with change of Abx and with use of flutter valve.   Not smoking and in fact has been quit for quite awhile.  Remains on O2 at 2 liters.    December 27 2022:  Repeat CT scan-  Band like collapse/consolidation in the right mid lung, likely within the superior segment of the right lower lobe, unchanged since  prior study  January 14 2023:  Pulmonary follow up visit.  Bronchoscopy recommended to evaluate RLL consolidation but patient strongly declined.    January 29 2023:  Scheduled follow up for lung cancer.  Has gained 1 lbs since last visit.  Patient states that he declined bronchoscopy because he was not certain if he would be a candidate for additional antineoplastic therapy if he was found to have recurrence  Review of Systems - Oncology  MEDICAL HISTORY: Past Medical History:  Diagnosis Date   AAA (abdominal aortic aneurysm) without rupture (HCC) 08/03/2015   AKI (acute kidney injury) (HCC) 01/08/2018   Last  Assessment & Plan:  Improved Nephrology consulted Likely 2/2 ATN and CIN in the setting of contrast at the outside hospital on 12/21, NSAIDs at the outside hospital, intra-operative hypotension, and entresto  use. Avoid nephrotoxic medications(entresto /diuretic) Renally dose medications Strict I&O Monitor creatinine(1.68 today)     Anemia 01/17/2018   Last Assessment & Plan:  Due to chemotherapy and anemia of chronic disease Iron panel shows anemia of chronic disease Follow up with PCP and oncologist   Arrhythmia    Asthma 09/23/2017   Atherosclerosis of native artery of both lower extremities (HCC) 08/03/2015   Bilateral carotid artery stenosis 08/03/2015   Cardiomyopathy (HCC) 11/18/2017   Chronic anticoagulation 06/05/2018   Chronic combined systolic and diastolic heart failure (HCC) 10/03/2017   Congestive heart failure (HCC) 09/23/2017   COPD (chronic obstructive pulmonary disease) (HCC) 09/23/2017   Diabetes mellitus (HCC) 09/23/2017   Diarrhea 01/14/2018   Last Assessment & Plan:  C diff - negative  GIP - negative Resolved   Diastolic dysfunction 09/23/2017   Duodenal nodule 11/10/2014   Esophageal reflux 09/23/2017   Essential hypertension 11/10/2014   Last Assessment & Plan:  Controlled off of medication Follow up with PCP as an outpatient   Fatigue 09/23/2017   Gout 09/23/2017   Hematuria 01/14/2018   Last Assessment & Plan:  Need UA as outpatient.   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/06/2018   Insulin dependent type 2 diabetes mellitus (HCC)  11/10/2014   Last Assessment & Plan:  Glucose 109-140 Continue ISS Monitor glucose   Left ventricular dysfunction 10/03/2017   Lung cancer (HCC)    Lung mass 10/04/2017   Non-small cell lung cancer (HCC) 10/03/2017   Pulmonary emboli (HCC) 03/06/2018   Pulmonary embolism (HCC)    PVC's (premature ventricular contractions) 09/23/2017   PVD (peripheral vascular disease) (HCC) 11/10/2014   Septic arthritis of knee, left (HCC) 01/04/2018   Added automatically from request for surgery [id number]  Last Assessment & Plan:  Prosthetic knee infection S/p wash out Culture negative  Continue Dapto + Ceftriaxone  + Rifampin for 4 weeks(currently day 7) ID was notified today prior to discharge He will need CBC, CMP, ESR, CRP weekly faxed to [phone]([facility] with ID) ID outpatient appointment is on 02/17/2017  Antibiotics administered by port by ho   Thrombocytopenia (HCC) 01/15/2018   Last Assessment & Plan:  Decreased to 78.  Due to chemotherapy Peripheral smear negative for  schistocytes LDH 211(within normal limits), haptoglobin 329(elevated), Retic 1.7 B12=714, Folate=20, copper pending    SURGICAL HISTORY: Past Surgical History:  Procedure Laterality Date   ABDOMINAL AORTIC ANEURYSM REPAIR     AORTA - ILIAC ARTERY BYPASS GRAFT     BACK SURGERY     CATARACT EXTRACTION, BILATERAL     CHOLECYSTECTOMY     HEMICOLECTOMY     HERNIA REPAIR     KNEE SURGERY Left    replaced all the hardwear   PROSTATE SURGERY     REPLACEMENT TOTAL KNEE     STENT PLACEMENT VASCULAR ([facility] HX)     TONSILLECTOMY      SOCIAL HISTORY: Social History   Socioeconomic History   Marital status: Married    Spouse name: Not on file   Number of children: Not on file   Years of education: Not on file   Highest education level: Not on file  Occupational History   Not on file  Tobacco Use   Smoking status: Former    Current packs/day: 0.00    Average packs/day: 2.5 packs/day for 40.0 years (100.0 ttl pk-yrs)    Types: Cigarettes     Start date: 72    Quit date: 2014    Years since quitting: 11.0    Passive exposure: Past   Smokeless tobacco: Never  Vaping Use   Vaping status: Never Used  Substance and Sexual Activity   Alcohol use: Not Currently   Drug use: Not Currently   Sexual activity: Not on file  Other Topics Concern   Not on file  Social History Narrative   Not on file   Social Drivers of Health   Financial Resource Strain: Not on file  Food Insecurity: Low Risk  (01/14/2023)   Received from [facility]   Hunger Vital Sign    Worried About Running Out of Food in the Last Year: Never true    Ran Out of Food in the Last Year: Never true  Transportation Needs: No Transportation Needs (01/14/2023)   Received from [facility]    In the past 12 months, has lack of reliable transportation kept you from medical appointments, meetings, work or from getting things needed for daily living? : No  Physical Activity: Not on file  Stress: Not on file  Social Connections: Not on file  Intimate Partner Violence: Not on file    FAMILY HISTORY Family History  Problem Relation Age of Onset   Arthritis Mother    Lung cancer Mother    Hypertension Mother    Arthritis Father    Heart attack Father    Prostate cancer Father    Hypertension Father    Hyperlipidemia Father    Heart disease Father    Alcohol abuse Father    Arthritis Brother    Lung cancer Brother     ALLERGIES:  is allergic to insulin glargine and quinolones.  MEDICATIONS:  Current Outpatient Medications  Medication Sig Dispense Refill   allopurinol  (ZYLOPRIM ) 300 MG tablet Take 300 mg by mouth daily.     apixaban  (ELIQUIS ) 5 MG TABS tablet Take 1 tablet (5 mg total) by mouth 2 (two) times daily. 180 tablet 3   carvedilol  (COREG ) 3.125 MG tablet Take 1 tablet (3.125 mg total) by mouth 2 (two) times daily. 180 tablet 2   celecoxib  (CELEBREX ) 200 MG capsule Take by mouth 2 (two) times daily.     ENTRESTO  97-103  MG TAKE 1 TABLET TWICE A  DAY 180 tablet 3   fluticasone-salmeterol (ADVAIR HFA) 115-21 MCG/ACT inhaler Inhale 2 puffs into the lungs 2 (two) times daily.      gabapentin (NEURONTIN) 100 MG capsule Take 100 mg by mouth 2 (two) times daily.     metFORMIN (GLUCOPHAGE) 500 MG tablet Take 500 mg by mouth daily with breakfast. 2 tablets daily     omeprazole (PRILOSEC) 20 MG capsule Take 20 mg by mouth daily.     oxyCODONE  (OXY IR/ROXICODONE ) 5 MG immediate release tablet Take 1 tablet (5 mg total) by mouth every 6 (six) hours as needed for severe pain (pain score 7-10). 120 tablet 0   OXYGEN  Inhale into the lungs continuous. 2 liters     PROAIR  HFA 108 (90 Base) MCG/ACT inhaler Inhale 1 puff into the lungs every 6 (six) hours as needed.      simvastatin  (ZOCOR ) 40 MG tablet Take 1 tablet (40 mg total) by mouth daily. 90 tablet 3   SPIRIVA  RESPIMAT 2.5 MCG/ACT AERS SMARTSIG:2 Puff(s) Via Inhaler Daily     tamsulosin (FLOMAX) 0.4 MG CAPS capsule Take 0.4 mg by mouth daily.     torsemide  (DEMADEX ) 20 MG tablet TAKE 1 TABLET DAILY, TAKE 1 EXTRA TABLET AT NOON ON MONDAY, WEDNESDAY, AND FRIDAY (Patient taking differently: Take 20 mg by mouth 2 (two) times daily. Takes 2 tablets  (40 mg) daily) 130 tablet 3   No current facility-administered medications for this visit.    PHYSICAL EXAMINATION:  ECOG PERFORMANCE STATUS: 2 - Symptomatic, <50% confined to bed   There were no vitals filed for this visit.   There were no vitals filed for this visit.    Physical Exam Vitals and nursing note reviewed.  Constitutional:      General: He is not in acute distress.    Appearance: Normal appearance. He is obese. He is not diaphoretic.  HENT:     Head: Normocephalic and atraumatic.     Right Ear: External ear normal.     Left Ear: External ear normal.     Nose: Nose normal.  Eyes:     General: No scleral icterus.    Conjunctiva/sclera: Conjunctivae normal.     Pupils: Pupils are equal, round, and  reactive to light.  Cardiovascular:     Rate and Rhythm: Normal rate and regular rhythm.     Heart sounds:     No friction rub. No gallop.  Pulmonary:     Effort: Pulmonary effort is normal. No respiratory distress.     Breath sounds: Normal breath sounds. No stridor.     Comments: Decreased BS RUL Abdominal:     Tenderness: There is no abdominal tenderness. There is no guarding or rebound.  Musculoskeletal:        General: No tenderness, deformity or signs of injury.     Cervical back: Normal range of motion and neck supple. No rigidity or tenderness.  Lymphadenopathy:     Head:     Right side of head: No submental, submandibular, tonsillar, preauricular, posterior auricular or occipital adenopathy.     Left side of head: No submental, submandibular, tonsillar, preauricular, posterior auricular or occipital adenopathy.     Cervical: No cervical adenopathy.     Right cervical: No superficial, deep or posterior cervical adenopathy.    Left cervical: No superficial, deep or posterior cervical adenopathy.     Upper Body:     Right upper body: No supraclavicular or axillary adenopathy.     Left upper  body: No supraclavicular or axillary adenopathy.     Lower Body: No right inguinal adenopathy. No left inguinal adenopathy.  Skin:    Coloration: Skin is not jaundiced.  Neurological:     General: No focal deficit present.     Mental Status: He is alert and oriented to person, place, and time.     Cranial Nerves: No cranial nerve deficit.  Psychiatric:        Mood and Affect: Mood normal.        Behavior: Behavior normal.        Thought Content: Thought content normal.        Judgment: Judgment normal.     LABORATORY DATA: I have personally reviewed the data as listed:  No visits with results within 1 Month(s) from this visit.  Latest known visit with results is:  Scanned Document on 12/18/2022  Component Date Value Ref Range Status   EGFR 12/03/2022 60.0   Final   Abstracted  by HIM    RADIOGRAPHIC STUDIES: I have personally reviewed the radiological images as listed and agree with the findings in the report  No results found.  ASSESSMENT/PLAN  83 y.o. male is here because of lung cancer.  Medical history notable for moderate emphysema, a distal descending thoracic aortic aneurysm measuring 4.4 cm, hypertension, congestive heart failure, diabetes mellitus, chronic kidney disease, multiple aortic aneurysms, peripheral artery disease, and history of prostate cancer 11 years ago, CHF with ejection fraction 37%.  Peripheral neuropathy.  Stage IIIA adenocarcinoma of lung, RUL September 2019:  Diagnosed with stage IIIA (T3 N1 M0) non-small cell lung cancer after presenting with severe bradycardia and left chest discomfort.  Had a 6.2 cm mass in the RUL with 1 paratracheal node 1-2 cm.  Bronchial brushings revealed non-small cell carcinoma of the lung, favoring adenocarcinoma.  MRI brain negative for metastasis. Treated with weekly carboplatin /paclitaxel  + XRT. He received 7 cycles of chemotherapy, which was completed in December 2019.     December 2019:  Admitted for septic arthritis of his knee.  Treated with daptomycin /ceftriaxone / rifampin.    February 2020:  Cardiorespiratory arrest due to acute PE.  He had extensive RLE DVT.  Received heparin  and transitioned to apixaban  5 mg twice daily, which he continues.   Dx's with aspiration pneumonia during that hospitalization.  CT chest revealed RUL mass improved to 3.8 cm; right hilar adenopathy stable.    March 2020:  Began maintenance durvalumab    June 2020:  CT chest  RUL mass decreased to 2.4 cm, with decrease in the pleural effusion.  MRI brain negative.   September 2020:  CT Chest-- RUL mass now 1.9 cm, with a possible right lower lobe pneumonia.    January 2021- CT chest-- Post treatment fibrotic consolidation and volume loss in RUL.  Previous clustered nodularity of the right lower lobe was essentially resolved.    February 2021- Foundation One of peripheral blood--- no reportable alterations with companion diagnostic claims.  Markers with potential clinical significance, included DNMT3A and TET2.     March 2021:  Completed 1 year of durvalumab  therapy   April 2021:  CT chest post treatment changes about the right hilum measuring 6.9 x 2.5 cm, previously 7.4 x 3.4 cm.  Nodular changes in the left lung base developing along the margin of the aorta measuring 1.7 x 1.1 cm, more conspicuous than on the previous exam. Upper abdominal aortic aneurysm with mural thrombus is similar in appearance measuring 4.6 cm.    July  2021:  PET/CT Treated tumor/radiation fibrosis involving the right lower lobe.  No recurrence or metastasis   January 2022:  CT -- Stable right infrahilar soft tissue at the site of previous treatment, and decreased size of nodular focus at the left lung base now measuring 13 x 10 mm as compared to 17 x 10 mm, likely nodular scarring.     September 2022- CT CAP -- Unchanged post treatment/post radiation appearance of the right lung.  No recurrent or metastatic disease. Redemonstrated aneurysmal aortic arch and descending thoracic aorta, maximum caliber of the distal arch approximately 4.6 x 4.2 cm and of the distal descending thoracic aorta approximately 5.2 x 5.2 cm.  November 21 2022:  Presented to [facility] ED via EMS with worsening SOB, weakness, fever, cough.  CT Chest/Abd with contrast-  No evidence of recurrent or metastatic carcinoma within the chest or abdomen. Stable 5.7 cm distal descending thoracic aortic aneurysm. Stable 4.6  cm suprarenal abdominal aortic aneurysm  Patient was discharged home on Azithromycin and Cefdinir.     December 03 2022:  CT CAP- New collapse of the superior segment of the right lower lobe with dense parenchymal consolidation mild atelectasis versus scarring is noted within the posterior left lung base. Interval increase in size multilevel aneurysmal  dilatation of the aorta both above and below the diaphragm status post aorto bi-iliac stent graft.   December 04 2022:  CT findings and history are concerning for possible lung cancer recurrence.  At best patient may have a postobstructive pneumonia.  Referring to pulmonology for consideration of diagnostic; possibly therapeutic bronchoscopy    Postobstructive pneumonia  December 17 2022:  Pulmonary follow up.  Placed on Augmentin with plans to repeat CT scan in 3 to 4 weeks.  If no improvement then brochoscopy   December 25 2022:  Feels a bit better with change of Abx and with use of flutter valve.   Remains on O2 at 2 liters.   December 27 2022:  Repeat CT scan-  Band like collapse/consolidation in the right mid lung, likely within the superior segment of the right lower lobe, unchanged since  prior study   January 14 2023:  Pulmonary follow up visit.  Bronchoscopy recommended to evaluate RLL consolidation but patient strongly declined.   January 29 2023:  Explained to patient that he is a potential candidate for antineoplastic therapy if he were found to have recurrent cancer.  Would need radiation oncology input to determine if he was a candidate for additional XRT if found to have local recurrence.  Patient has reconsidered his position regarding bronchoscopy therefore we will refer back to pulmonology.      Cancer Staging  Non-small cell lung cancer [facility]) Staging form: Lung, AJCC 8th Edition - Clinical stage from 10/03/2017: Stage IIIA (cT3, cN1, cM0) - Signed by Cornelius Wanda DEL, MD on 08/18/2020 Histopathologic type: Adenocarcinoma, NOS Stage prefix: Initial diagnosis Histologic grade (G): GX Histologic grading system: 4 grade system Laterality: Right Tumor size (mm): 62 Lymph-vascular invasion (LVI): Presence of LVI unknown/indeterminate Diagnostic confirmation: Positive histology Specimen type: Core Needle Biopsy Staged by: Managing physician Stage used in treatment planning:  Yes National guidelines used in treatment planning: Yes Type of national guideline used in treatment planning: NCCN Staging comments: Not surgical candidate Concurrent chemoradiation with carbo/taxol , followed by 1 yr Durvalumab  - Pathologic: No stage assigned - Unsigned    No problem-specific Assessment & Plan notes found for this encounter.    No orders of the defined  types were placed in this encounter.   30  minutes was spent in patient care.  This included time spent preparing to see the patient (e.g., review of tests), obtaining and/or reviewing separately obtained history, counseling and educating the patient/family/caregiver, ordering medications, tests, or procedures; documenting clinical information in the electronic or other health record, independently interpreting results and communicating results to the patient/family/caregiver as well as coordination of care.       All questions were answered. The patient knows to call the clinic with any problems, questions or concerns.  This note was electronically signed.    Guillermina JAYSON Perla, MD  01/29/2023 8:50 AM

## 2023-02-20 DIAGNOSIS — J9611 Chronic respiratory failure with hypoxia: Secondary | ICD-10-CM | POA: Diagnosis not present

## 2023-02-20 DIAGNOSIS — I110 Hypertensive heart disease with heart failure: Secondary | ICD-10-CM | POA: Diagnosis not present

## 2023-02-20 DIAGNOSIS — J398 Other specified diseases of upper respiratory tract: Secondary | ICD-10-CM | POA: Diagnosis not present

## 2023-02-20 DIAGNOSIS — Z86711 Personal history of pulmonary embolism: Secondary | ICD-10-CM | POA: Diagnosis not present

## 2023-02-20 DIAGNOSIS — E785 Hyperlipidemia, unspecified: Secondary | ICD-10-CM | POA: Diagnosis not present

## 2023-02-20 DIAGNOSIS — Z79899 Other long term (current) drug therapy: Secondary | ICD-10-CM | POA: Diagnosis not present

## 2023-02-20 DIAGNOSIS — J181 Lobar pneumonia, unspecified organism: Secondary | ICD-10-CM | POA: Diagnosis not present

## 2023-02-20 DIAGNOSIS — I5042 Chronic combined systolic (congestive) and diastolic (congestive) heart failure: Secondary | ICD-10-CM | POA: Diagnosis not present

## 2023-02-20 DIAGNOSIS — E119 Type 2 diabetes mellitus without complications: Secondary | ICD-10-CM | POA: Diagnosis not present

## 2023-02-20 DIAGNOSIS — Z85118 Personal history of other malignant neoplasm of bronchus and lung: Secondary | ICD-10-CM | POA: Diagnosis not present

## 2023-02-20 DIAGNOSIS — Z7984 Long term (current) use of oral hypoglycemic drugs: Secondary | ICD-10-CM | POA: Diagnosis not present

## 2023-02-20 DIAGNOSIS — Z87891 Personal history of nicotine dependence: Secondary | ICD-10-CM | POA: Diagnosis not present

## 2023-02-26 ENCOUNTER — Other Ambulatory Visit: Payer: Self-pay | Admitting: Hematology and Oncology

## 2023-02-26 DIAGNOSIS — G8929 Other chronic pain: Secondary | ICD-10-CM

## 2023-02-26 MED ORDER — OXYCODONE HCL 5 MG PO TABS: 5.0000 mg | ORAL_TABLET | Freq: Four times a day (QID) | ORAL | 0 refills | Status: DC | PRN

## 2023-02-26 NOTE — Progress Notes (Incomplete)
[facility]  [address] [place],  [place]  [address] [phone]  Clinic Day:  02/26/23   Referring physician: Paulina Fusi, MD  ASSESSMENT & PLAN:  Assessment & Plan: Non-small cell lung cancer Stage IIIA non-small cell lung cancer with good response to concurrent chemoradiation.  He completed 1 year of durvalumab in March 2021 and tolerated this without significant difficulty.  CT imaging in April revealed continued improvement.  PET scan from July 2021 revealed a large area of treated tumor/radiation fibrosis involving the right lower lobe with no hypermetabolism to suggest residual or recurrent tumor.  No evidence of metastatic disease was observed.  CT scan from September 2022 revealed unchanged post treatment/post radiation appearance of the right lung, with perihilar fibrosis and consolidation. His scan from May 01, 2021 remains stable.  The most recent scan from 05/30/2022 remains stable with a band of thickening of the pleural-parenchymal right lower lobe but no evidence of lung cancer recurrence or metastasis. It is over 3 years that he has been off treatment as of March, 2024.   Thoracic Aortic Aneurysm This is located in the descending thoracic aorta measuring 5.7cm. The last measurement from October, 2023 was 5.2. However, I don't think this has really changed since the measurement from 1 year ago was 5.7cm.   Abdominal Aortic Aneurysm This is a suprarenal location and measures 4.6cm currently as compared to 4.8cm one year ago, so it remains stable.  Anemia This is largely anemia of chronic disease.  His hemoglobin is stable at 11.7.    Plan He finished his treatment in March, 2021, so he is over 3 years off treatment now. He had a CT chest, abdomen, and pelvis on 05/30/2022 that showed no evidence of recurrent or metastatic carcinoma within the chest or abdomen, stable 5.7 cm distal descending thoracic aortic aneurysm, a stable 4.6 cm suprarenal abdominal  aortic aneurysm, a small umbilical hernia which is stable and a tiny non-obstructing right renal calculus. His port was flushed during his scan. His WBC is 6.3, hemoglobin stable 11.7, and platelet count is 161,000 today. His CMP and CEA are normal. I will see him back in 6 months with CBC, CMP, CEA, and CT of chest, abdomen, and pelvis.  The patient and his wife understand the plans discussed today and are in agreement with them.  He knows to contact our office if he develops concerns prior to his next appointment.  I provided 25 minutes of face-to-face time during this this encounter and > 50% was spent counseling as documented under my assessment and plan.   Dellia Beckwith, MD [facility] AT [facility] [address] [place] [address] Dept: [phone] Dept Fax: [phone]   CHIEF COMPLAINT:  CC: Stage IIIA non-small cell lung cancer  Current Treatment: Observation   HISTORY OF PRESENT ILLNESS:  John Maddox is a 83 y.o. male with stage IIIA (T3 N1 M0) non-small cell lung cancer diagnosed in September 2019.  He had presented with severe bradycardia and left chest discomfort and was found to have a 6.2 cm mass in the right upper lobe with 1 paratracheal node measuring 1-2 cm.  Bronchoscopy was done and the 1st set of cytology and pathology results were negative.  Bronchial brushings and washings revealed malignant cells definitely consistent with non-small cell carcinoma of the lung, favoring adenocarcinoma, in a 2nd specimen.  We recommended concurrent chemoradiation with weekly carboplatin/paclitaxel.  He had many comorbidities  including moderate emphysema, a distal descending thoracic aortic aneurysm measuring 4.4 cm, hypertension, congestive heart failure, diabetes mellitus, chronic kidney disease, multiple aortic aneurysms, peripheral artery disease, and history of prostate cancer 11 years ago.  His Cardiolite scan revealed  mild global hypokinesis with an ejection fraction of 37%.  He also had pre-existing peripheral neuropathy.  MRI of the head did not reveal any evidence of intracranial metastasis.  He received 7 cycles of chemotherapy, which was completed in December 2019.  Unfortunately, the patient became quite ill and septic later in December 2019.  He was transferred from [facility] to [facility].  He was found to have septic arthritis of his knee and was placed on IV antibiotics at home with daptomycin and ceftriaxone as well as rifampin.  The hemoglobin was 8.0 and dropped as low as 7.1. When the patient went to see Dr. Luciana Axe of Infectious Disease in February 2020, he collapsed prior to getting his labs drawn and had a cardiorespiratory arrest requiring resuscitation. He was found to have acute pulmonary embolism of small to moderate size, but no significant right heart strain.  He had deep venous thrombosis of the right femoral vein, right popliteal vein and right posterior tibial vein.  He was placed on heparin IV and transitioned to apixaban 5 mg twice daily, which he continues.  He was also found to have aspiration pneumonia during that hospitalization. A CT chest revealed the spiculated mass in the posterior right upper lobe was slightly smaller measuring 3.8 cm in maximum diameter, decreased from 4.0 cm.  There was stable right hilar adenopathy measuring 1.4 cm.  There was a small right pleural effusion.   He started maintenance durvalumab in March 2020 and tolerated it well.  The patient had a repeat echocardiogram, which showed improvement in his ejection fraction.  CT chest in early June revealed decrease in the size of the right upper lobe mass to 2.4 cm, with decrease in the pleural effusion.  There was a stable thoracic aortic aneurysm measuring 4.7 cm.  When he was seen in June, MRI brain did not reveal intracranial metastasis or other acute abnormality.  Repeat CT chest in September 2020  revealed improvement in the size of the right upper lobe pulmonary nodule from 2.4 cm to 1.9 cm, with a possible right lower lobe pneumonia.  CT chest in January 2021 revealed slight interval increase in dense, post treatment fibrotic consolidation and volume loss of the perihilar right upper lobe, which is consistent with maturation of radiation fibrosis.  The previously seen clustered nodularity of the right lower lobe was essentially resolved. Foundation One testing of the peripheral blood show no reportable alterations with companion diagnostic claims.  Other biomarkers with potential clinical significance, included DNMT3A and TET2.  He completed a total of 1 year of durvalumab therapy on March 10th.  CT chest in April revealed post treatment changes about the right hilum measuring 6.9 x 2.5 cm, previously 7.4 x 3.4 cm.  There were nodular changes in the left lung base developing along the margin of the aorta measuring 1.7 x 1.1 cm, more conspicuous than on the previous exam. The upper abdominal aortic aneurysm with mural thrombus is similar in appearance measuring 4.6 cm.  PET imaging from July 2021 revealed a large are of treated tumor/radiation fibrosis involving the right lower lobe.  There was no hypermetabolism to suggest residual or recurrent tumor, no findings for mediastinal/hilar adenopathy or pulmonary metastatic disease.  There was no evidence  of abdominal/pelvic metastatic disease or osseous metastatic disease.  Stable, severe vascular disease was seen.  The upper abdominal aorta measures 4.4 cm.  CT from January of 2022 revealed stable right infrahilar soft tissue at the site of previous treatment, and decreased size of nodular focus at the left lung base now measuring 13 x 10 mm as compared to 17 x 10 mm, likely nodular scarring. Tortuous and aneurysmal appearance of the thoracoabdominal aorta with similar appearance.  CT chest, abdomen and pelvis from September of 2022 revealed unchanged post  treatment/post radiation appearance of the right lung, with perihilar fibrosis and consolidation with no evidence of recurrent or metastatic disease. Redemonstrated aneurysmal aortic arch and descending thoracic aorta, maximum caliber of the distal arch approximately 4.6 x 4.2 cm and of the distal descending thoracic aorta approximately 5.2 x 5.2 cm.  INTERVAL HISTORY:  Jay is here today for repeat clinical assessment for stage IIIA non-small cell lung cancer. He finished his treatment in March, 2021, so he is over 3 years off treatment now. Patient states that he feels *** and ***.     He denies signs of infection such as sore throat, sinus drainage, cough, or urinary symptoms.  He denies fevers or recurrent chills. He denies pain. He denies nausea, vomiting, chest pain, dyspnea or cough. His appetite is *** and his weight {Weight change:10426}.  He states that he feels good today and complains of pain in his back, hips, and shoulders. He had a CT chest, abdomen, and pelvis on 05/30/2022 that showed no evidence of recurrent or metastatic carcinoma within the chest or abdomen, stable 5.7 cm distal descending thoracic aortic aneurysm, a stable 4.6 cm suprarenal abdominal aortic aneurysm, an small umbilical hernia which is stable and a tiny non-obstructing right renal calculus. His port was flushed during his scan. His WBC is 6.3, hemoglobin stable 11.7, and platelet count 161,000 today. His CMP and CEA are normal. I will see him back in 6 months with CBC, CMP, CEA, and CT of chest, abdomen, and pelvis. He denies signs of infection such as sore throat, sinus drainage, cough, or urinary symptoms.  He denies fevers or recurrent chills. He denies pain. He denies nausea, vomiting, chest pain, dyspnea or cough. His appetite is ok and his weight has increased 3 pounds over last 2.5 weeks . This patient is accompanied in the office by his  wife .   REVIEW OF SYSTEMS:  Review of Systems  Constitutional:   Positive for fatigue. Negative for appetite change, chills, diaphoresis, fever and unexpected weight change.  HENT:  Negative.  Negative for hearing loss, lump/mass, mouth sores, nosebleeds, sore throat, tinnitus, trouble swallowing and voice change.   Eyes: Negative.  Negative for eye problems and icterus.  Respiratory:  Positive for shortness of breath (With exertion). Negative for chest tightness, cough, hemoptysis and wheezing.   Cardiovascular: Negative.  Negative for chest pain, leg swelling and palpitations.  Gastrointestinal: Negative.  Negative for abdominal distention, abdominal pain, blood in stool, constipation, diarrhea, nausea, rectal pain and vomiting.  Genitourinary: Negative.  Negative for bladder incontinence, difficulty urinating, dyspareunia, dysuria, frequency, hematuria, nocturia, pelvic pain and penile discharge.   Musculoskeletal:  Positive for arthralgias (Chronic, stable due to degenerative disease), back pain and gait problem (Due to degenerative disease). Negative for flank pain, myalgias, neck pain and neck stiffness.       Pain in back, hip, and shoulders  Skin: Negative.  Negative for itching, rash and wound.  Neurological:  Positive  for gait problem (Due to degenerative disease). Negative for dizziness, extremity weakness, headaches, light-headedness, numbness, seizures and speech difficulty.  Hematological:  Negative for adenopathy. Does not bruise/bleed easily.  Psychiatric/Behavioral: Negative.  Negative for confusion, decreased concentration, depression, sleep disturbance and suicidal ideas. The patient is not nervous/anxious.      VITALS:  There were no vitals taken for this visit.  Wt Readings from Last 3 Encounters:  01/29/23 236 lb 11.2 oz (107.4 kg)  12/25/22 235 lb (106.6 kg)  12/04/22 228 lb 12.8 oz (103.8 kg)    There is no height or weight on file to calculate BMI.  Performance status (ECOG): 2 - Symptomatic, <50% confined to bed  PHYSICAL EXAM:   Physical Exam Vitals and nursing note reviewed. Exam conducted with a chaperone present.  Constitutional:      General: He is not in acute distress.    Appearance: Normal appearance. He is normal weight. He is not ill-appearing, toxic-appearing or diaphoretic.  HENT:     Head: Normocephalic and atraumatic.     Right Ear: Tympanic membrane, ear canal and external ear normal. There is no impacted cerumen.     Left Ear: Tympanic membrane, ear canal and external ear normal. There is no impacted cerumen.     Nose: Nose normal. No congestion or rhinorrhea.     Mouth/Throat:     Mouth: Mucous membranes are moist.     Pharynx: Oropharynx is clear. No oropharyngeal exudate or posterior oropharyngeal erythema.  Eyes:     General: No scleral icterus.       Right eye: No discharge.        Left eye: No discharge.     Extraocular Movements: Extraocular movements intact.     Conjunctiva/sclera: Conjunctivae normal.     Pupils: Pupils are equal, round, and reactive to light.  Neck:     Vascular: No carotid bruit.  Cardiovascular:     Rate and Rhythm: Normal rate and regular rhythm.     Pulses: Normal pulses.     Heart sounds: Normal heart sounds. No murmur heard.    No friction rub. No gallop.  Pulmonary:     Effort: Pulmonary effort is normal. No respiratory distress.     Breath sounds: No stridor. Examination of the right-lower field reveals rhonchi. Rhonchi present. No wheezing (faint) or rales.  Chest:     Chest wall: No tenderness.  Abdominal:     General: Bowel sounds are normal. There is no distension.     Palpations: Abdomen is soft. There is no hepatomegaly, splenomegaly or mass.     Tenderness: There is no abdominal tenderness. There is no right CVA tenderness, left CVA tenderness, guarding or rebound.     Hernia: No hernia is present.  Musculoskeletal:        General: No swelling, tenderness, deformity or signs of injury. Normal range of motion.     Cervical back: Normal range of  motion and neck supple. No rigidity or tenderness.     Right lower leg: Edema (2+) present.     Left lower leg: Edema (2+) present.  Lymphadenopathy:     Cervical: No cervical adenopathy.     Upper Body:     Right upper body: No supraclavicular or axillary adenopathy.     Left upper body: No supraclavicular or axillary adenopathy.     Lower Body: No right inguinal adenopathy. No left inguinal adenopathy.  Skin:    General: Skin is warm and dry.  Coloration: Skin is not jaundiced or pale.     Findings: No bruising, erythema, lesion or rash.  Neurological:     General: No focal deficit present.     Mental Status: He is alert and oriented to person, place, and time. Mental status is at baseline.     Cranial Nerves: No cranial nerve deficit.     Sensory: No sensory deficit.     Motor: No weakness.     Coordination: Coordination normal.     Gait: Gait normal.     Deep Tendon Reflexes: Reflexes normal.  Psychiatric:        Mood and Affect: Mood normal.        Behavior: Behavior normal.        Thought Content: Thought content normal.        Judgment: Judgment normal.     LABS:      Latest Ref Rng & Units 05/30/2022   12:00 AM 02/02/2022    2:35 PM 10/31/2021   12:00 AM  CBC  WBC  6.3     6.3  5.6       5.6      Hemoglobin 13.5 - 17.5 11.7     11.7  12.5       12.5      Hematocrit 41 - 53 36     37.1  38       38      Platelets 150 - 400 K/uL 161     185  169       169         This result is from an external source.      Latest Ref Rng & Units 05/30/2022   12:00 AM 02/02/2022    2:35 PM 10/31/2021   12:00 AM  CMP  Glucose 70 - 99 mg/dL  97    BUN 4 - 21 19     17  14       14       Creatinine 0.6 - 1.3 1.1     1.18  1.0       1.0      Sodium 137 - 147 137     139  137       137      Potassium 3.5 - 5.1 mEq/L 4.5     3.6  4.2       4.2      Chloride 99 - 108 103     106  106       106      CO2 13 - 22 28     25  28       28       Calcium 8.7 - 10.7 8.8     8.4   9.2       9.2      Total Protein 6.5 - 8.1 g/dL  6.5    Total Bilirubin 0.3 - 1.2 mg/dL  0.4    Alkaline Phos 25 - 125 168     146  145       145      AST 14 - 40 28     24  33       33      ALT 10 - 40 U/L 22     21  27       27          This result is from an external source.  Lab Results  Component Value Date   CEA1 2.1 02/01/2021   CEA 2.2 05/30/2022   /  CEA  Date Value Ref Range Status  05/30/2022 2.2  Final  02/01/2021 2.1 0.0 - 4.7 ng/mL Final    Comment:    (NOTE)                             Nonsmokers          <3.9                             Smokers             <5.6 [employer] Electrochemiluminescence Immunoassay (ECLIA) Values obtained with different assay methods or kits cannot be used interchangeably.  Results cannot be interpreted as absolute evidence of the presence or absence of malignant disease. Performed At: [facility] [address] [place], [place] [id number] Jolene Schimke MD ON:[id number]    STUDIES:      HISTORY:   Past Medical History:  Diagnosis Date   AAA (abdominal aortic aneurysm) without rupture (HCC) 08/03/2015   AKI (acute kidney injury) (HCC) 01/08/2018   Last Assessment & Plan:  Improved Nephrology consulted Likely 2/2 ATN and CIN in the setting of contrast at the outside hospital on 12/21, NSAIDs at the outside hospital, intra-operative hypotension, and entresto use. Avoid nephrotoxic medications(entresto/diuretic) Renally dose medications Strict I&O Monitor creatinine(1.68 today)     Anemia 01/17/2018   Last Assessment & Plan:  Due to chemotherapy and anemia of chronic disease Iron panel shows anemia of chronic disease Follow up with PCP and oncologist   Arrhythmia    Asthma 09/23/2017   Atherosclerosis of native artery of both lower extremities (HCC) 08/03/2015   Bilateral carotid artery stenosis 08/03/2015   Cardiomyopathy (HCC) 11/18/2017   Chronic anticoagulation 06/05/2018   Chronic combined systolic and  diastolic heart failure (HCC) 10/03/2017   Congestive heart failure (HCC) 09/23/2017   COPD (chronic obstructive pulmonary disease) (HCC) 09/23/2017   Diabetes mellitus (HCC) 09/23/2017   Diarrhea 01/14/2018   Last Assessment & Plan:  C diff - negative  GIP - negative Resolved   Diastolic dysfunction 09/23/2017   Duodenal nodule 11/10/2014   Esophageal reflux 09/23/2017   Essential hypertension 11/10/2014   Last Assessment & Plan:  Controlled off of medication Follow up with PCP as an outpatient   Fatigue 09/23/2017   Gout 09/23/2017   Hematuria 01/14/2018   Last Assessment & Plan:  Need UA as outpatient.   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/06/2018   Insulin dependent type 2 diabetes mellitus (HCC) 11/10/2014   Last Assessment & Plan:  Glucose 109-140 Continue ISS Monitor glucose   Left ventricular dysfunction 10/03/2017   Lung cancer (HCC)    Lung mass 10/04/2017   Non-small cell lung cancer (HCC) 10/03/2017   Pulmonary emboli (HCC) 03/06/2018   Pulmonary embolism (HCC)    PVC's (premature ventricular  contractions) 09/23/2017   PVD (peripheral vascular disease) (HCC) 11/10/2014   Septic arthritis of knee, left (HCC) 01/04/2018   Added automatically from request for surgery [id number]  Last Assessment & Plan:  Prosthetic knee infection S/p wash out Culture negative  Continue Dapto + Ceftriaxone + Rifampin for 4 weeks(currently day 7) ID was notified today prior to discharge He will need CBC, CMP, ESR, CRP weekly faxed to [phone](OPAT with ID) ID  outpatient appointment is on 02/17/2017  Antibiotics administered by port by ho   Thrombocytopenia (HCC) 01/15/2018   Last Assessment & Plan:  Decreased to 78.  Due to chemotherapy Peripheral smear negative for schistocytes LDH 211(within normal limits), haptoglobin 329(elevated), Retic 1.7 B12=714, Folate=20, copper pending    Past Surgical History:  Procedure Laterality Date   ABDOMINAL AORTIC ANEURYSM REPAIR     AORTA - ILIAC ARTERY BYPASS GRAFT     BACK SURGERY     CATARACT EXTRACTION, BILATERAL     CHOLECYSTECTOMY     HEMICOLECTOMY     HERNIA REPAIR     KNEE SURGERY Left    replaced all the hardwear   PROSTATE SURGERY     REPLACEMENT TOTAL KNEE     STENT PLACEMENT VASCULAR ([facility] HX)     TONSILLECTOMY      Family History  Problem Relation Age of Onset   Arthritis Mother    Lung cancer Mother    Hypertension Mother    Arthritis Father    Heart attack Father    Prostate cancer Father    Hypertension Father    Hyperlipidemia Father    Heart disease Father    Alcohol abuse Father    Arthritis Brother    Lung cancer Brother     Social History:  reports that he quit smoking about 11 years ago. His smoking use included cigarettes. He started smoking about 51 years ago. He has a 100 pack-year smoking history. He has been exposed to tobacco smoke. He has never used smokeless tobacco. He reports that he does not currently use alcohol. He reports that he does not currently use drugs.The patient is accompanied by his wife today.  Allergies:  Allergies  Allergen Reactions   Insulin Glargine     Stomach pain  Other Reaction(s): GI Intolerance  Stomach pain and urinary frequency   Quinolones Other (See Comments)    History of aortic aneurysm status post repair.  Use with caution    Current Medications: Current Outpatient Medications  Medication Sig Dispense Refill   allopurinol (ZYLOPRIM) 300 MG tablet Take 300 mg by mouth daily.     apixaban (ELIQUIS) 5 MG TABS tablet Take  1 tablet (5 mg total) by mouth 2 (two) times daily. 180 tablet 3   carvedilol (COREG) 3.125 MG tablet Take 1 tablet (3.125 mg total) by mouth 2 (two) times daily. 180 tablet 2   celecoxib (CELEBREX) 200 MG capsule Take by mouth 2 (two) times daily.     ENTRESTO 97-103 MG TAKE 1 TABLET TWICE A DAY 180 tablet 3   fluticasone-salmeterol (ADVAIR HFA) 115-21 MCG/ACT inhaler Inhale 2 puffs into the lungs 2 (two) times daily.      gabapentin (NEURONTIN) 100 MG capsule Take 100 mg by mouth 2 (two) times daily.     metFORMIN (GLUCOPHAGE) 500 MG tablet Take 500 mg by mouth daily with breakfast. 2 tablets daily     omeprazole (PRILOSEC) 20 MG capsule Take 20 mg by mouth daily.     oxyCODONE (OXY IR/ROXICODONE)  5 MG immediate release tablet Take 1 tablet (5 mg total) by mouth every 6 (six) hours as needed for severe pain (pain score 7-10). 120 tablet 0   OXYGEN Inhale into the lungs continuous. 2 liters     PROAIR HFA 108 (90 Base) MCG/ACT inhaler Inhale 1 puff into the lungs every 6 (six) hours as needed.      simvastatin (ZOCOR) 40 MG tablet Take 1 tablet (40 mg total) by mouth daily. 90 tablet 3   SPIRIVA RESPIMAT 2.5 MCG/ACT AERS SMARTSIG:2 Puff(s) Via Inhaler Daily     tamsulosin (FLOMAX) 0.4 MG CAPS capsule Take 0.4 mg by mouth daily.     torsemide (DEMADEX) 20 MG tablet TAKE 1 TABLET DAILY, TAKE 1 EXTRA TABLET AT NOON ON MONDAY, WEDNESDAY, AND FRIDAY (Patient taking differently: Take 20 mg by mouth 2 (two) times daily. Takes 2 tablets  (40 mg) daily) 130 tablet 3   No current facility-administered medications for this visit.     I,Jasmine M Lassiter,acting as a scribe for Dellia Beckwith, MD.,have documented all relevant documentation on the behalf of Dellia Beckwith, MD,as directed by  Dellia Beckwith, MD while in the presence of Dellia Beckwith, MD.

## 2023-02-27 ENCOUNTER — Inpatient Hospital Stay: Payer: Medicare Other | Admitting: Oncology

## 2023-02-28 DIAGNOSIS — G620 Drug-induced polyneuropathy: Secondary | ICD-10-CM | POA: Diagnosis not present

## 2023-02-28 DIAGNOSIS — E1169 Type 2 diabetes mellitus with other specified complication: Secondary | ICD-10-CM | POA: Diagnosis not present

## 2023-02-28 DIAGNOSIS — Z86711 Personal history of pulmonary embolism: Secondary | ICD-10-CM | POA: Diagnosis not present

## 2023-02-28 DIAGNOSIS — Z85118 Personal history of other malignant neoplasm of bronchus and lung: Secondary | ICD-10-CM | POA: Diagnosis not present

## 2023-02-28 DIAGNOSIS — I712 Thoracic aortic aneurysm, without rupture, unspecified: Secondary | ICD-10-CM | POA: Diagnosis not present

## 2023-02-28 DIAGNOSIS — J449 Chronic obstructive pulmonary disease, unspecified: Secondary | ICD-10-CM | POA: Diagnosis not present

## 2023-02-28 DIAGNOSIS — E785 Hyperlipidemia, unspecified: Secondary | ICD-10-CM | POA: Diagnosis not present

## 2023-02-28 DIAGNOSIS — M109 Gout, unspecified: Secondary | ICD-10-CM | POA: Diagnosis not present

## 2023-02-28 DIAGNOSIS — Z8546 Personal history of malignant neoplasm of prostate: Secondary | ICD-10-CM | POA: Diagnosis not present

## 2023-02-28 DIAGNOSIS — T451X5A Adverse effect of antineoplastic and immunosuppressive drugs, initial encounter: Secondary | ICD-10-CM | POA: Diagnosis not present

## 2023-02-28 DIAGNOSIS — I110 Hypertensive heart disease with heart failure: Secondary | ICD-10-CM | POA: Diagnosis not present

## 2023-02-28 DIAGNOSIS — K219 Gastro-esophageal reflux disease without esophagitis: Secondary | ICD-10-CM | POA: Diagnosis not present

## 2023-03-01 NOTE — Progress Notes (Signed)
 [facility]  [address] [place],  [place]  [address] [phone]  Clinic Day:  03/05/23  Referring physician: Paulina Fusi, MD  ASSESSMENT & PLAN:  Assessment: Non-small cell lung cancer Stage IIIA non-small cell lung cancer with good response to concurrent chemoradiation.  He completed 1 year of durvalumab in March 2021 and tolerated this without significant difficulty.  CT imaging in April revealed continued improvement.  PET scan from July 2021 revealed a large area of treated tumor/radiation fibrosis involving the right lower lobe with no hypermetabolism to suggest residual or recurrent tumor.  No evidence of metastatic disease was observed.  CT scan from September 2022 revealed unchanged post treatment/post radiation appearance of the right lung, with perihilar fibrosis and consolidation. His scan from May 01, 2021 remains stable.  CT from 05/30/2022 remains stable with a band of thickening of the pleural-parenchymal right lower lobe but no evidence of lung cancer recurrence or metastasis. It is nearly 4 years that he has been off treatment as of March, 2024. CT chest, abdomen, and pelvis done on 12/03/2022 revealed new superior segment of the right lower lobe collapse with consolidative parenchymal change and collapse of the corresponding airways. The pulmonologist has now done a bronchoscopy and found mucous plugs but no evidence of malignancy. Cultures were positive for Citrobacter.   Thoracic Aortic Aneurysm This is located in the descending thoracic aorta measuring 5.7 cm. The last measurement from October, 2023 was 5.2. It increased to 5.7 cm last year and now 6.2 cm on his CT from November, 2024.  Abdominal Aortic Aneurysm This is a suprarenal location and measures 4.6 cm currently as compared to 4.8 cm one year ago, so it remains stable.  Anemia This is largely anemia of chronic disease.  His hemoglobin is stable at 11.7.    Plan: CT chest, abdomen, and  pelvis done on 12/03/2022 revealed interval increase in size of multilevel aneurysmal dilatation of the aorta both above and below the diaphragm status post aortic bi-iliac stent graft. New superior segment of the right lower lobe collapse with consolidative parenchymal change and collapse of the corresponding airways was also noted. Grossly stable appearing fat attenuation intraluminal nodule within the proximal 3rd portion of the duodenum, non-obstructive right nephrolithiasis, diverticulosis coli, and advanced degenerative changes of the spine was also noted. He declined a referral initially for evaluation but was then referred to a pulmonologist who performed a bronchoscopy on 02/20/2023. This revealed that the findings on the previous CT appear to be due to chronic atelectasis from dynamic airway collapse and chronic inflammation of the tracheobronchial tree. Pathology revealed no malignant cells identified and cultures revealed a large growth of Citrobacter so I will give him a course of antibiotics. The patient notes not being placed on any antibiotics after his bronchoscopy and I will prescribe keflex 500 mg TID for 10 days. He will have CBC, CMP, and CEA drawn today and have his port flushed. I will see him back in 3-4 months with CBC, CMP, CEA, and port flush. We will probably wait until the Fall to repeat scans unless he has symptoms sooner. The patient and his wife understand the plans discussed today and are in agreement with them.  He knows to contact our office if he develops concerns prior to his next appointment.  I provided 20 minutes of face-to-face time during this this encounter and > 50% was spent counseling as documented under my assessment and plan.   Dellia Beckwith, MD  [facility] - A DEPT OF Eligha Bridegroom [facility] [address] [place] [address] Dept: [phone] Dept Fax: [phone]   Orders Placed This Encounter   Procedures   CBC with Differential (Cancer Center Only)    Standing Status:   Future    Expected Date:   06/13/2023    Expiration Date:   03/04/2024   CMP (Cancer Center only)    Standing Status:   Future    Expected Date:   06/13/2023    Expiration Date:   03/04/2024   CEA (Access)    Standing Status:   Future    Expected Date:   06/13/2023    Expiration Date:   03/04/2024    CHIEF COMPLAINT:  CC: Stage IIIA non-small cell lung cancer  Current Treatment: Observation  HISTORY OF PRESENT ILLNESS:  John Maddox. John Maddox is a 83 y.o. male with stage IIIA (T3 N1 M0) non-small cell lung cancer diagnosed in September 2019.  He had presented with severe bradycardia and left chest discomfort and was found to have a 6.2 cm mass in the right upper lobe with 1 paratracheal node measuring 1-2 cm.  Bronchoscopy was done and the 1st set of cytology and pathology results were negative.  Bronchial brushings and washings revealed malignant cells definitely consistent with non-small cell carcinoma of the lung, favoring adenocarcinoma, in a 2nd specimen.  We recommended concurrent chemoradiation with weekly carboplatin/paclitaxel.  He had many comorbidities including moderate emphysema, a distal descending thoracic aortic aneurysm measuring 4.4 cm, hypertension, congestive heart failure, diabetes mellitus, chronic kidney disease, multiple aortic aneurysms, peripheral artery disease, and history of prostate cancer 11 years ago.  His Cardiolite scan revealed mild global hypokinesis with an ejection fraction of 37%.  He also had pre-existing peripheral neuropathy.  MRI of the head did not reveal any evidence of intracranial metastasis.  He received 7 cycles of chemotherapy, which was completed in December 2019.  Unfortunately, the patient became quite ill and septic later in December 2019.  He was transferred from [facility] to [facility].  He was found to have septic arthritis of his knee and was  placed on IV antibiotics at home with daptomycin and ceftriaxone as well as rifampin.  The hemoglobin was 8.0 and dropped as low as 7.1. When the patient went to see Dr. Luciana Axe of Infectious Disease in February 2020, he collapsed prior to getting his labs drawn and had a cardiorespiratory arrest requiring resuscitation. He was found to have acute pulmonary embolism of small to moderate size, but no significant right heart strain.  He had deep venous thrombosis of the right femoral vein, right popliteal vein and right posterior tibial vein.  He was placed on heparin IV and transitioned to apixaban 5 mg twice daily, which he continues.  He was also found to have aspiration pneumonia during that hospitalization. A CT chest revealed the spiculated mass in the posterior right upper lobe was slightly smaller measuring 3.8 cm in maximum diameter, decreased from 4.0 cm.  There was stable right hilar adenopathy measuring 1.4 cm.  There was a small right pleural effusion.   He started maintenance durvalumab in March 2020 and tolerated it well.  The patient had a repeat echocardiogram, which showed improvement in his ejection fraction.  CT chest in early June revealed decrease in the size of the right upper lobe mass to 2.4 cm, with decrease in the pleural effusion.  There was a stable  thoracic aortic aneurysm measuring 4.7 cm.  When he was seen in June, MRI brain did not reveal intracranial metastasis or other acute abnormality.  Repeat CT chest in September 2020 revealed improvement in the size of the right upper lobe pulmonary nodule from 2.4 cm to 1.9 cm, with a possible right lower lobe pneumonia.  CT chest in January 2021 revealed slight interval increase in dense, post treatment fibrotic consolidation and volume loss of the perihilar right upper lobe, which is consistent with maturation of radiation fibrosis.  The previously seen clustered nodularity of the right lower lobe was essentially resolved. [facility]  testing of the peripheral blood show no reportable alterations with companion diagnostic claims.  Other biomarkers with potential clinical significance, included DNMT3A and TET2.  He completed a total of 1 year of durvalumab therapy on March 10th.  CT chest in April revealed post treatment changes about the right hilum measuring 6.9 x 2.5 cm, previously 7.4 x 3.4 cm.  There were nodular changes in the left lung base developing along the margin of the aorta measuring 1.7 x 1.1 cm, more conspicuous than on the previous exam. The upper abdominal aortic aneurysm with mural thrombus is similar in appearance measuring 4.6 cm.  PET imaging from July 2021 revealed a large are of treated tumor/radiation fibrosis involving the right lower lobe.  There was no hypermetabolism to suggest residual or recurrent tumor, no findings for mediastinal/hilar adenopathy or pulmonary metastatic disease.  There was no evidence of abdominal/pelvic metastatic disease or osseous metastatic disease.  Stable, severe vascular disease was seen.  The upper abdominal aorta measures 4.4 cm.  CT from January of 2022 revealed stable right infrahilar soft tissue at the site of previous treatment, and decreased size of nodular focus at the left lung base now measuring 13 x 10 mm as compared to 17 x 10 mm, likely nodular scarring. Tortuous and aneurysmal appearance of the thoracoabdominal aorta with similar appearance.  CT chest, abdomen and pelvis from September of 2022 revealed unchanged post treatment/post radiation appearance of the right lung, with perihilar fibrosis and consolidation with no evidence of recurrent or metastatic disease. Redemonstrated aneurysmal aortic arch and descending thoracic aorta, maximum caliber of the distal arch approximately 4.6 x 4.2 cm and of the distal descending thoracic aorta approximately 5.2 x 5.2 cm.  INTERVAL HISTORY:  John Maddox is here today for repeat clinical assessment for stage IIIA non-small cell lung  cancer. He finished his treatment in March, 2021, so he is nearly 4 years off treatment now. Patient states that he feels ok but continues to have chronic pain. He had a CT chest, abdomen, and pelvis done on 12/03/2022 that revealed interval increase in size of multilevel aneurysmal dilatation of the aorta both above and below the diaphragm status post aortic bi-iliac stent graft. New superior segment of the right lower lobe collapse with consolidative parenchymal change and collapse of the corresponding airways was also noted. Grossly stable appearing fat attenuation intraluminal nodule within the proximal 3rd portion of the duodenum, non-obstructive right nephrolithiasis, diverticulosis coli, and advanced degenerative changes of the spine was also noted. He declined a referral initially for evaluation but was then referred to a pulmonologist who preformed a bronchoscopy on 02/20/2023. This revealed that the findings on the previous CT appear to be due to chronic atelectasis from dynamic airway collapse and chronic inflammation of the tracheobronchial tree. Pathology revealed no malignant cells identified and cultures revealed a large growth of Citrobacter so I will give him  a course of antibiotics. The patient notes not being placed on any antibiotics after his bronchoscopy and I will prescribe keflex 500 mg TID for 10 days. He will have CBC, CMP, and CEA drawn today and have his port flushed. I will see him back in 3-4 months with CBC, CMP, CEA, and port flush. We will probably wait until the Fall to repeat scans unless he has symptoms sooner. He denies signs of infection such as sore throat, sinus drainage, or urinary symptoms.  He denies fevers or recurrent chills. He denies nausea, vomiting, chest pain, or dyspnea. His appetite is ok and he could not be weighed today due to being wheelchair bound.This patient is accompanied in the office by his  wife .    REVIEW OF SYSTEMS:  Review of Systems   Constitutional:  Positive for fatigue. Negative for appetite change, chills, diaphoresis, fever and unexpected weight change.  HENT:  Negative.  Negative for hearing loss, lump/mass, mouth sores, nosebleeds, sore throat, tinnitus, trouble swallowing and voice change.   Eyes: Negative.  Negative for eye problems and icterus.  Respiratory:  Positive for shortness of breath (With exertion). Negative for chest tightness, cough, hemoptysis and wheezing.   Cardiovascular: Negative.  Negative for chest pain, leg swelling and palpitations.  Gastrointestinal: Negative.  Negative for abdominal distention, abdominal pain, blood in stool, constipation, diarrhea, nausea, rectal pain and vomiting.  Genitourinary: Negative.  Negative for bladder incontinence, difficulty urinating, dyspareunia, dysuria, frequency, hematuria, nocturia, pelvic pain and penile discharge.   Musculoskeletal:  Positive for arthralgias (Chronic, stable due to degenerative disease), back pain and gait problem (Due to degenerative disease). Negative for flank pain, myalgias, neck pain and neck stiffness.       Pain in back, hip, and shoulders  Skin: Negative.  Negative for itching, rash and wound.  Neurological:  Positive for gait problem (Due to degenerative disease). Negative for dizziness, extremity weakness, headaches, light-headedness, numbness, seizures and speech difficulty.  Hematological:  Negative for adenopathy. Does not bruise/bleed easily.  Psychiatric/Behavioral: Negative.  Negative for confusion, decreased concentration, depression, sleep disturbance and suicidal ideas. The patient is not nervous/anxious.      VITALS:  Blood pressure 102/71, pulse 77, temperature 97.6 F (36.4 C), temperature source Oral, resp. rate 19, height 5\' 10"  (1.778 m), SpO2 98%.  Wt Readings from Last 3 Encounters:  01/29/23 236 lb 11.2 oz (107.4 kg)  12/25/22 235 lb (106.6 kg)  12/04/22 228 lb 12.8 oz (103.8 kg)    Body mass index is 33.96  kg/m.  Performance status (ECOG): 2 - Symptomatic, <50% confined to bed  PHYSICAL EXAM:  Physical Exam Vitals and nursing note reviewed. Exam conducted with a chaperone present.  Constitutional:      General: He is not in acute distress.    Appearance: Normal appearance. He is normal weight. He is not ill-appearing, toxic-appearing or diaphoretic.  HENT:     Head: Normocephalic and atraumatic.     Right Ear: Tympanic membrane, ear canal and external ear normal. There is no impacted cerumen.     Left Ear: Tympanic membrane, ear canal and external ear normal. There is no impacted cerumen.     Nose: Nose normal. No congestion or rhinorrhea.     Mouth/Throat:     Mouth: Mucous membranes are moist.     Pharynx: Oropharynx is clear. No oropharyngeal exudate or posterior oropharyngeal erythema.  Eyes:     General: No scleral icterus.       Right eye: No  discharge.        Left eye: No discharge.     Extraocular Movements: Extraocular movements intact.     Conjunctiva/sclera: Conjunctivae normal.     Pupils: Pupils are equal, round, and reactive to light.  Neck:     Vascular: No carotid bruit.  Cardiovascular:     Rate and Rhythm: Normal rate and regular rhythm.     Pulses: Normal pulses.     Heart sounds: Normal heart sounds. No murmur heard.    No friction rub. No gallop.  Pulmonary:     Effort: Pulmonary effort is normal. No respiratory distress.     Breath sounds: No stridor. Examination of the left-lower field reveals decreased breath sounds. Decreased breath sounds (mild) and wheezing present. No rhonchi or rales.     Comments: Occasional expiratory wheezes Chest:     Chest wall: No tenderness.  Abdominal:     General: Bowel sounds are normal. There is no distension.     Palpations: Abdomen is soft. There is no hepatomegaly, splenomegaly or mass.     Tenderness: There is no abdominal tenderness. There is no right CVA tenderness, left CVA tenderness, guarding or rebound.      Hernia: No hernia is present.  Musculoskeletal:        General: No swelling, tenderness, deformity or signs of injury. Normal range of motion.     Cervical back: Normal range of motion and neck supple. No rigidity or tenderness.     Right lower leg: Edema (2+) present.     Left lower leg: Edema (2+) present.  Lymphadenopathy:     Cervical: No cervical adenopathy.     Upper Body:     Right upper body: No supraclavicular or axillary adenopathy.     Left upper body: No supraclavicular or axillary adenopathy.     Lower Body: No right inguinal adenopathy. No left inguinal adenopathy.  Skin:    General: Skin is warm and dry.     Coloration: Skin is not jaundiced or pale.     Findings: No bruising, erythema, lesion or rash.  Neurological:     General: No focal deficit present.     Mental Status: He is alert and oriented to person, place, and time. Mental status is at baseline.     Cranial Nerves: No cranial nerve deficit.     Sensory: No sensory deficit.     Motor: No weakness.     Coordination: Coordination normal.     Gait: Gait normal.     Deep Tendon Reflexes: Reflexes normal.  Psychiatric:        Mood and Affect: Mood normal.        Behavior: Behavior normal.        Thought Content: Thought content normal.        Judgment: Judgment normal.    LABS:      Latest Ref Rng & Units 03/05/2023   12:15 PM 05/30/2022   12:00 AM 02/02/2022    2:35 PM  CBC  WBC 4.0 - 10.5 K/uL 5.7  6.3     6.3   Hemoglobin 13.0 - 17.0 g/dL 82.9  56.2     13.0   Hematocrit 39.0 - 52.0 % 36.5  36     37.1   Platelets 150 - 400 K/uL 186  161     185      This result is from an external source.      Latest Ref Rng & Units 03/05/2023  12:15 PM 05/30/2022   12:00 AM 02/02/2022    2:35 PM  CMP  Glucose 70 - 99 mg/dL 161   97   BUN 8 - 23 mg/dL 19  19     17    Creatinine 0.61 - 1.24 mg/dL 0.96  1.1     0.45   Sodium 135 - 145 mmol/L 140  137     139   Potassium 3.5 - 5.1 mmol/L 4.3  4.5     3.6    Chloride 98 - 111 mmol/L 103  103     106   CO2 22 - 32 mmol/L 26  28     25    Calcium 8.9 - 10.3 mg/dL 9.4  8.8     8.4   Total Protein 6.5 - 8.1 g/dL 6.5   6.5   Total Bilirubin 0.0 - 1.2 mg/dL 0.4   0.4   Alkaline Phos 38 - 126 U/L 161  168     146   AST 15 - 41 U/L 28  28     24    ALT 0 - 44 U/L 15  22     21       This result is from an external source.   Lab Results  Component Value Date   CEA1 2.1 02/01/2021   CEA 1.85 03/05/2023   /  CEA  Date Value Ref Range Status  02/01/2021 2.1 0.0 - 4.7 ng/mL Final    Comment:    (NOTE)                             Nonsmokers          <3.9                             Smokers             <5.6 [employer] Electrochemiluminescence Immunoassay (ECLIA) Values obtained with different assay methods or kits cannot be used interchangeably.  Results cannot be interpreted as absolute evidence of the presence or absence of malignant disease. Performed At: [facility] [address] [place], [place] [id number] Jolene Schimke MD NW:[id number]    [facility])  Date Value Ref Range Status  03/05/2023 1.85 0.00 - 5.00 ng/mL Final    Comment:    (NOTE) This test was performed using Beckman Coulter's paramagnetic chemiluminescent immunoassay. Values obtained from different assay methods cannot be used interchangeably. Please note that up to 8% of patients who smoke may see values 5.1-10.0 ng/ml and 1% of patients who smoke may see CEA levels >10.0 ng/ml. Performed at [facility], [address] [place], [place] [address]    STUDIES:      HISTORY:   Past Medical History:  Diagnosis Date   AAA (abdominal aortic aneurysm) without rupture (HCC) 08/03/2015   AKI (acute kidney injury) (HCC) 01/08/2018   Last Assessment & Plan:  Improved Nephrology consulted Likely 2/2 ATN and CIN in the setting of contrast at the outside hospital on 12/21, NSAIDs at the outside hospital, intra-operative hypotension, and  entresto use. Avoid nephrotoxic medications(entresto/diuretic) Renally dose medications Strict I&O Monitor creatinine(1.68 today)     Anemia 01/17/2018   Last Assessment & Plan:  Due to chemotherapy and anemia of chronic disease Iron panel shows anemia of chronic disease Follow up with PCP and oncologist   Arrhythmia    Asthma 09/23/2017  Atherosclerosis of native artery of both lower extremities (HCC) 08/03/2015   Bilateral carotid artery stenosis 08/03/2015   Cardiomyopathy (HCC) 11/18/2017   Chronic anticoagulation 06/05/2018   Chronic combined systolic and diastolic heart failure (HCC) 10/03/2017   Congestive heart failure (HCC) 09/23/2017   COPD (chronic obstructive pulmonary disease) (HCC) 09/23/2017   Diabetes mellitus (HCC) 09/23/2017   Diarrhea 01/14/2018   Last Assessment & Plan:  C diff - negative  GIP - negative Resolved   Diastolic dysfunction 09/23/2017   Duodenal nodule 11/10/2014   Esophageal reflux 09/23/2017   Essential hypertension 11/10/2014   Last Assessment & Plan:  Controlled off of medication Follow up with PCP as an outpatient   Fatigue 09/23/2017   Gout 09/23/2017   Hematuria 01/14/2018   Last Assessment & Plan:  Need UA as outpatient.   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/06/2018   Insulin dependent type 2 diabetes mellitus (HCC) 11/10/2014   Last Assessment & Plan:  Glucose 109-140 Continue ISS Monitor glucose   Left ventricular dysfunction 10/03/2017   Lung cancer (HCC)    Lung mass 10/04/2017   Non-small cell lung cancer  (HCC) 10/03/2017   Pulmonary emboli (HCC) 03/06/2018   Pulmonary embolism (HCC)    PVC's (premature ventricular contractions) 09/23/2017   PVD (peripheral vascular disease) (HCC) 11/10/2014   Septic arthritis of knee, left (HCC) 01/04/2018   Added automatically from request for surgery [id number]  Last Assessment & Plan:  Prosthetic knee infection S/p wash out Culture negative  Continue Dapto + Ceftriaxone + Rifampin for 4 weeks(currently day 7) ID was notified today prior to discharge He will need CBC, CMP, ESR, CRP weekly faxed to [phone]([facility] with ID) ID outpatient appointment is on 02/17/2017  Antibiotics administered by port by ho   Thrombocytopenia (HCC) 01/15/2018   Last Assessment & Plan:  Decreased to 78.  Due to chemotherapy Peripheral smear negative for schistocytes LDH 211(within normal limits), haptoglobin 329(elevated), Retic 1.7 B12=714, Folate=20, copper pending    Past Surgical History:  Procedure Laterality Date   ABDOMINAL AORTIC ANEURYSM REPAIR     AORTA - ILIAC ARTERY BYPASS GRAFT     BACK SURGERY     CATARACT EXTRACTION, BILATERAL     CHOLECYSTECTOMY     HEMICOLECTOMY     HERNIA REPAIR     KNEE SURGERY Left    replaced all the hardwear   PROSTATE SURGERY     REPLACEMENT TOTAL KNEE     STENT PLACEMENT VASCULAR ([facility] HX)     TONSILLECTOMY      Family History  Problem Relation Age of Onset   Arthritis Mother    Lung cancer Mother    Hypertension Mother    Arthritis Father    Heart attack Father    Prostate cancer Father    Hypertension Father    Hyperlipidemia Father    Heart disease Father    Alcohol abuse Father    Arthritis Brother    Lung cancer Brother     Social History:  reports that  he quit smoking about 11 years ago. His smoking use included cigarettes. He started smoking about 51 years ago. He has a 100 pack-year smoking history. He has been exposed to tobacco smoke. He has never used smokeless tobacco. He reports that he does not currently use  alcohol. He reports that he does not currently use drugs.The patient is accompanied by his wife today.  Allergies:  Allergies  Allergen Reactions   Insulin Glargine     Stomach pain  Other Reaction(s): GI Intolerance  Stomach pain and urinary frequency   Quinolones Other (See Comments)    History of aortic aneurysm status post repair.  Use with caution    Current Medications: Current Outpatient Medications  Medication Sig Dispense Refill   allopurinol (ZYLOPRIM) 300 MG tablet Take 300 mg by mouth daily.     apixaban (ELIQUIS) 5 MG TABS tablet Take 1 tablet (5 mg total) by mouth 2 (two) times daily. 180 tablet 3   carvedilol (COREG) 3.125 MG tablet Take 1 tablet (3.125 mg total) by mouth 2 (two) times daily. 180 tablet 2   celecoxib (CELEBREX) 200 MG capsule Take by mouth 2 (two) times daily.     cephALEXin (KEFLEX) 500 MG capsule Take 1 capsule (500 mg total) by mouth 3 (three) times daily. 30 capsule 0   ENTRESTO 97-103 MG TAKE 1 TABLET TWICE A DAY 180 tablet 3   fluticasone-salmeterol (ADVAIR HFA) 115-21 MCG/ACT inhaler Inhale 2 puffs into the lungs 2 (two) times daily.      gabapentin (NEURONTIN) 100 MG capsule Take 100 mg by mouth 2 (two) times daily.     metFORMIN (GLUCOPHAGE) 500 MG tablet Take 500 mg by mouth daily with breakfast. 2 tablets daily     omeprazole (PRILOSEC) 20 MG capsule Take 20 mg by mouth daily.     oxyCODONE (OXY IR/ROXICODONE) 5 MG immediate release tablet Take 1 tablet (5 mg total) by mouth every 6 (six) hours as needed for severe pain (pain score 7-10). 120 tablet 0   OXYGEN Inhale into the lungs continuous. 2 liters     PROAIR HFA 108 (90 Base) MCG/ACT inhaler Inhale 1 puff into the lungs every 6 (six) hours as needed.      simvastatin (ZOCOR) 40 MG tablet Take 1 tablet (40 mg total) by mouth daily. 90 tablet 3   SPIRIVA RESPIMAT 2.5 MCG/ACT AERS SMARTSIG:2 Puff(s) Via Inhaler Daily     tamsulosin (FLOMAX) 0.4 MG CAPS capsule Take 0.4 mg by mouth daily.      torsemide (DEMADEX) 20 MG tablet TAKE 1 TABLET DAILY, TAKE 1 EXTRA TABLET AT NOON ON MONDAY, WEDNESDAY, AND FRIDAY (Patient taking differently: Take 20 mg by mouth 2 (two) times daily. Takes 2 tablets  (40 mg) daily) 130 tablet 3   No current facility-administered medications for this visit.    I,Jasmine M Lassiter,acting as a scribe for Dellia Beckwith, MD.,have documented all relevant documentation on the behalf of Dellia Beckwith, MD,as directed by  Dellia Beckwith, MD while in the presence of Dellia Beckwith, MD.

## 2023-03-05 ENCOUNTER — Other Ambulatory Visit: Payer: Self-pay | Admitting: Oncology

## 2023-03-05 ENCOUNTER — Encounter: Payer: Self-pay | Admitting: Oncology

## 2023-03-05 ENCOUNTER — Inpatient Hospital Stay: Payer: Medicare Other

## 2023-03-05 ENCOUNTER — Inpatient Hospital Stay: Payer: Medicare Other | Attending: Oncology | Admitting: Oncology

## 2023-03-05 ENCOUNTER — Telehealth: Payer: Self-pay

## 2023-03-05 VITALS — BP 102/71 | HR 77 | Temp 97.6°F | Resp 19 | Ht 70.0 in

## 2023-03-05 DIAGNOSIS — C3410 Malignant neoplasm of upper lobe, unspecified bronchus or lung: Secondary | ICD-10-CM | POA: Insufficient documentation

## 2023-03-05 DIAGNOSIS — I2699 Other pulmonary embolism without acute cor pulmonale: Secondary | ICD-10-CM | POA: Diagnosis not present

## 2023-03-05 DIAGNOSIS — A498 Other bacterial infections of unspecified site: Secondary | ICD-10-CM

## 2023-03-05 DIAGNOSIS — Z87891 Personal history of nicotine dependence: Secondary | ICD-10-CM | POA: Insufficient documentation

## 2023-03-05 DIAGNOSIS — I714 Abdominal aortic aneurysm, without rupture, unspecified: Secondary | ICD-10-CM | POA: Diagnosis not present

## 2023-03-05 DIAGNOSIS — I712 Thoracic aortic aneurysm, without rupture, unspecified: Secondary | ICD-10-CM | POA: Diagnosis not present

## 2023-03-05 DIAGNOSIS — D638 Anemia in other chronic diseases classified elsewhere: Secondary | ICD-10-CM | POA: Diagnosis not present

## 2023-03-05 DIAGNOSIS — C3491 Malignant neoplasm of unspecified part of right bronchus or lung: Secondary | ICD-10-CM

## 2023-03-05 LAB — CBC WITH DIFFERENTIAL (CANCER CENTER ONLY)
Abs Immature Granulocytes: 0.01 10*3/uL (ref 0.00–0.07)
Basophils Absolute: 0 10*3/uL (ref 0.0–0.1)
Basophils Relative: 1 %
Eosinophils Absolute: 0.2 10*3/uL (ref 0.0–0.5)
Eosinophils Relative: 4 %
HCT: 36.5 % — ABNORMAL LOW (ref 39.0–52.0)
Hemoglobin: 11.7 g/dL — ABNORMAL LOW (ref 13.0–17.0)
Immature Granulocytes: 0 %
Lymphocytes Relative: 18 %
Lymphs Abs: 1 10*3/uL (ref 0.7–4.0)
MCH: 28.4 pg (ref 26.0–34.0)
MCHC: 32.1 g/dL (ref 30.0–36.0)
MCV: 88.6 fL (ref 80.0–100.0)
Monocytes Absolute: 0.4 10*3/uL (ref 0.1–1.0)
Monocytes Relative: 7 %
Neutro Abs: 4.1 10*3/uL (ref 1.7–7.7)
Neutrophils Relative %: 70 %
Platelet Count: 186 10*3/uL (ref 150–400)
RBC: 4.12 MIL/uL — ABNORMAL LOW (ref 4.22–5.81)
RDW: 16.4 % — ABNORMAL HIGH (ref 11.5–15.5)
WBC Count: 5.7 10*3/uL (ref 4.0–10.5)
nRBC: 0 % (ref 0.0–0.2)
nRBC: 0 /100{WBCs}

## 2023-03-05 LAB — CMP (CANCER CENTER ONLY)
ALT: 15 U/L (ref 0–44)
AST: 28 U/L (ref 15–41)
Albumin: 3.7 g/dL (ref 3.5–5.0)
Alkaline Phosphatase: 161 U/L — ABNORMAL HIGH (ref 38–126)
Anion gap: 12 (ref 5–15)
BUN: 19 mg/dL (ref 8–23)
CO2: 26 mmol/L (ref 22–32)
Calcium: 9.4 mg/dL (ref 8.9–10.3)
Chloride: 103 mmol/L (ref 98–111)
Creatinine: 1.29 mg/dL — ABNORMAL HIGH (ref 0.61–1.24)
GFR, Estimated: 56 mL/min — ABNORMAL LOW (ref >60)
Glucose, Bld: 103 mg/dL — ABNORMAL HIGH (ref 70–99)
Potassium: 4.3 mmol/L (ref 3.5–5.1)
Sodium: 140 mmol/L (ref 135–145)
Total Bilirubin: 0.4 mg/dL (ref 0.0–1.2)
Total Protein: 6.5 g/dL (ref 6.5–8.1)

## 2023-03-05 LAB — CEA (ACCESS): CEA (CHCC): 1.85 ng/mL (ref 0.00–5.00)

## 2023-03-05 MED ORDER — HEPARIN SOD (PORK) LOCK FLUSH 100 UNIT/ML IV SOLN
500.0000 [IU] | Freq: Once | INTRAVENOUS | Status: AC
  Administered 2023-03-05: 500 [IU] via INTRAVENOUS

## 2023-03-05 MED ORDER — CEPHALEXIN 500 MG PO CAPS: 500.0000 mg | ORAL_CAPSULE | Freq: Three times a day (TID) | ORAL | 0 refills | Status: DC

## 2023-03-05 MED ORDER — SODIUM CHLORIDE 0.9% FLUSH
10.0000 mL | INTRAVENOUS | Status: DC | PRN
  Administered 2023-03-05: 10 mL

## 2023-03-05 MED ORDER — SULFAMETHOXAZOLE-TRIMETHOPRIM 800-160 MG PO TABS: 1.0000 | ORAL_TABLET | Freq: Two times a day (BID) | ORAL | 0 refills | Status: DC

## 2023-03-05 NOTE — Telephone Encounter (Signed)
 [place] Sink, patients spouse notified of message/

## 2023-03-05 NOTE — Telephone Encounter (Signed)
-----   Message from Dellia Beckwith sent at 03/05/2023  3:03 PM EST ----- Regarding: call Tell him anemia stable with hgb at 11.7. BS good at 103 and all labs good except kidneys too dry.  I think 1 water pill (torsemide) is not enough and 2 daily is too much.  I think he needs to skip the pm dose every other day or just take 1/2 pill in the afternoon.  Send copies of his labs to the other doctors

## 2023-03-06 ENCOUNTER — Telehealth: Payer: Self-pay

## 2023-03-06 NOTE — Telephone Encounter (Signed)
 Called wife [place] Sink on 03/05/23.

## 2023-03-06 NOTE — Telephone Encounter (Signed)
-----   Message from Dellia Beckwith sent at 03/05/2023  3:03 PM EST ----- Regarding: call Tell him anemia stable with hgb at 11.7. BS good at 103 and all labs good except kidneys too dry.  I think 1 water pill (torsemide) is not enough and 2 daily is too much.  I think he needs to skip the pm dose every other day or just take 1/2 pill in the afternoon.  Send copies of his labs to the other doctors

## 2023-03-12 ENCOUNTER — Encounter: Payer: Self-pay | Admitting: Hematology and Oncology

## 2023-03-26 ENCOUNTER — Other Ambulatory Visit: Payer: Self-pay

## 2023-03-26 DIAGNOSIS — G8929 Other chronic pain: Secondary | ICD-10-CM

## 2023-03-26 MED ORDER — OXYCODONE HCL 5 MG PO TABS: 5.0000 mg | ORAL_TABLET | Freq: Four times a day (QID) | ORAL | 0 refills | Status: DC | PRN

## 2023-04-11 DIAGNOSIS — R918 Other nonspecific abnormal finding of lung field: Secondary | ICD-10-CM | POA: Diagnosis not present

## 2023-04-11 DIAGNOSIS — I7123 Aneurysm of the descending thoracic aorta, without rupture: Secondary | ICD-10-CM | POA: Diagnosis not present

## 2023-04-11 DIAGNOSIS — J181 Lobar pneumonia, unspecified organism: Secondary | ICD-10-CM | POA: Diagnosis not present

## 2023-04-19 ENCOUNTER — Ambulatory Visit: Payer: Medicare Other | Admitting: Cardiology

## 2023-04-24 ENCOUNTER — Other Ambulatory Visit: Payer: Self-pay

## 2023-04-24 DIAGNOSIS — G8929 Other chronic pain: Secondary | ICD-10-CM

## 2023-04-24 MED ORDER — OXYCODONE HCL 5 MG PO TABS: 5.0000 mg | ORAL_TABLET | Freq: Four times a day (QID) | ORAL | 0 refills | Status: DC | PRN

## 2023-05-07 ENCOUNTER — Other Ambulatory Visit: Payer: Self-pay | Admitting: Cardiology

## 2023-05-07 NOTE — Telephone Encounter (Signed)
 Prescription sent to pharmacy.

## 2023-05-24 ENCOUNTER — Other Ambulatory Visit: Payer: Self-pay

## 2023-05-24 DIAGNOSIS — G8929 Other chronic pain: Secondary | ICD-10-CM

## 2023-05-24 MED ORDER — OXYCODONE HCL 5 MG PO TABS: 5.0000 mg | ORAL_TABLET | Freq: Four times a day (QID) | ORAL | 0 refills | Status: DC | PRN

## 2023-06-11 NOTE — Progress Notes (Signed)
 [facility]  [address] [place],  [place]  [address] [phone]  Clinic Day:  06/13/23  Referring physician: Adrian Hopper, MD  ASSESSMENT & PLAN:  Assessment: Non-small cell lung cancer Stage IIIA non-small cell lung cancer with good response to concurrent chemoradiation.  He completed 1 year of durvalumab  in March 2021 and tolerated this without significant difficulty.  CT imaging in April revealed continued improvement.  PET scan from July 2021 revealed a large area of treated tumor/radiation fibrosis involving the right lower lobe with no hypermetabolism to suggest residual or recurrent tumor.  No evidence of metastatic disease was observed.  CT scan from September 2022 revealed unchanged post treatment/post radiation appearance of the right lung, with perihilar fibrosis and consolidation. His scan from May 01, 2021 remains stable.  CT from 05/30/2022 remains stable with a band of thickening of the pleural-parenchymal right lower lobe but no evidence of lung cancer recurrence or metastasis. CT chest, abdomen, and pelvis done on 12/03/2022 revealed new superior segment of the right lower lobe collapse with consolidative parenchymal change and collapse of the corresponding airways. The pulmonologist has now done a bronchoscopy in February, 2025 and found mucous plugs but no evidence of malignancy. Cultures were positive for Citrobacter. CT chest done in [facility] on 04/11/2023 that revealed persistent bandlike consolidation in the right mid to lower lung, possibly superior right lower lobe, minimal tree-in-bud density at the right base suggesting infectious or inflammatory bronchiolitis, and multifocal aneurysmal dilatation of the aortic arch, descending thoracic aorta and proximal abdominal aorta. These are stable findings.  Thoracic Aortic Aneurysm This is located in the descending thoracic aorta measuring 5.7 cm. The last measurement from October, 2023 was 5.2. It  increased to 5.7 cm last year and now 6.2 cm on his CT from November, 2024. This is stable on the current scan of 04/11/23  Abdominal Aortic Aneurysm This is a suprarenal location and measures 4.6 cm currently as compared to 4.8 cm one year ago, so it remains stable.  Anemia This is largely anemia of chronic disease.  His hemoglobin is stable at 11.5.    Plan: Patient had a CT chest done on 04/11/2023 that revealed persistent bandlike consolidation in the right mid to lower lung, possibly superior right lower lobe, minimal tree-in-bud density at the right base suggesting infectious or inflammatory bronchiolitis, and multifocal aneurysmal dilatation of the aortic arch, descending thoracic aorta and proximal abdominal aorta. These are stable findings. He has a WBC of 5.0, low hemoglobin of 11.5, and platelet count of 162,000. His CMP is normal other than a low total protein of 6.3 and elevated alkaline phosphatase of 153. His CEA is pending and I will add a PSA to his labs today. I will see him back in 6 months with CBC, CMP, CEA, and port flush. If all is well, we can wait until next year to repeat a CT of the chest.  The patient and his wife understand the plans discussed today and are in agreement with them.  He knows to contact our office if he develops concerns prior to his next appointment.  I provided 18 minutes of face-to-face time during this this encounter and > 50% was spent counseling as documented under my assessment and plan.   Nolia Baumgartner, MD  [facility] DEPT OF [place]  [facility] [address] [place] [address] Dept: [phone] Dept Fax: [phone]   No orders  of the defined types were placed in this encounter.  CHIEF COMPLAINT:  CC: Stage IIIA non-small cell lung cancer  Current Treatment: Observation  HISTORY OF PRESENT ILLNESS:  John Maddox is a 83 y.o. male with stage IIIA (T3 N1 M0)  non-small cell lung cancer diagnosed in September 2019.  He had presented with severe bradycardia and left chest discomfort and was found to have a 6.2 cm mass in the right upper lobe with 1 paratracheal node measuring 1-2 cm.  Bronchoscopy was done and the 1st set of cytology and pathology results were negative.  Bronchial brushings and washings revealed malignant cells definitely consistent with non-small cell carcinoma of the lung, favoring adenocarcinoma, in a 2nd specimen.  We recommended concurrent chemoradiation with weekly carboplatin /paclitaxel .  He had many comorbidities including moderate emphysema, a distal descending thoracic aortic aneurysm measuring 4.4 cm, hypertension, congestive heart failure, diabetes mellitus, chronic kidney disease, multiple aortic aneurysms, peripheral artery disease, and history of prostate cancer 11 years ago.  His Cardiolite scan revealed mild global hypokinesis with an ejection fraction of 37%.  He also had pre-existing peripheral neuropathy.  MRI of the head did not reveal any evidence of intracranial metastasis.  He received 7 cycles of chemotherapy, which was completed in December 2019.  Unfortunately, the patient became quite ill and septic later in December 2019.  He was transferred from [place] to [facility].  He was found to have septic arthritis of his knee and was placed on IV antibiotics at home with daptomycin  and ceftriaxone  as well as rifampin.  The hemoglobin was 8.0 and dropped as low as 7.1. When the patient went to see Dr. Seymour Dapper of Infectious Disease in February 2020, he collapsed prior to getting his labs drawn and had a cardiorespiratory arrest requiring resuscitation. He was found to have acute pulmonary embolism of small to moderate size, but no significant right heart strain.  He had deep venous thrombosis of the right femoral vein, right popliteal vein and right posterior tibial vein.  He was placed on heparin  IV and  transitioned to apixaban  5 mg twice daily, which he continues.  He was also found to have aspiration pneumonia during that hospitalization. A CT chest revealed the spiculated mass in the posterior right upper lobe was slightly smaller measuring 3.8 cm in maximum diameter, decreased from 4.0 cm.  There was stable right hilar adenopathy measuring 1.4 cm.  There was a small right pleural effusion.   He started maintenance durvalumab  in March 2020 and tolerated it well.  The patient had a repeat echocardiogram, which showed improvement in his ejection fraction.  CT chest in early June revealed decrease in the size of the right upper lobe mass to 2.4 cm, with decrease in the pleural effusion.  There was a stable thoracic aortic aneurysm measuring 4.7 cm.  When he was seen in June, MRI brain did not reveal intracranial metastasis or other acute abnormality.  Repeat CT chest in September 2020 revealed improvement in the size of the right upper lobe pulmonary nodule from 2.4 cm to 1.9 cm, with a possible right lower lobe pneumonia.  CT chest in January 2021 revealed slight interval increase in dense, post treatment fibrotic consolidation and volume loss of the perihilar right upper lobe, which is consistent with maturation of radiation fibrosis.  The previously seen clustered nodularity of the right lower lobe was essentially resolved. Foundation One testing of the peripheral blood show no reportable alterations with companion diagnostic claims.  Other biomarkers with potential clinical significance, included DNMT3A and TET2.  He completed a total of 1 year of durvalumab  therapy on March 10th.  CT chest in April revealed post treatment changes about the right hilum measuring 6.9 x 2.5 cm, previously 7.4 x 3.4 cm.  There were nodular changes in the left lung base developing along the margin of the aorta measuring 1.7 x 1.1 cm, more conspicuous than on the previous exam. The upper abdominal aortic aneurysm with mural  thrombus is similar in appearance measuring 4.6 cm.  PET imaging from July 2021 revealed a large are of treated tumor/radiation fibrosis involving the right lower lobe.  There was no hypermetabolism to suggest residual or recurrent tumor, no findings for mediastinal/hilar adenopathy or pulmonary metastatic disease.  There was no evidence of abdominal/pelvic metastatic disease or osseous metastatic disease.  Stable, severe vascular disease was seen.  The upper abdominal aorta measures 4.4 cm.  CT from January of 2022 revealed stable right infrahilar soft tissue at the site of previous treatment, and decreased size of nodular focus at the left lung base now measuring 13 x 10 mm as compared to 17 x 10 mm, likely nodular scarring. Tortuous and aneurysmal appearance of the thoracoabdominal aorta with similar appearance.  CT chest, abdomen and pelvis from September of 2022 revealed unchanged post treatment/post radiation appearance of the right lung, with perihilar fibrosis and consolidation with no evidence of recurrent or metastatic disease. Redemonstrated aneurysmal aortic arch and descending thoracic aorta, maximum caliber of the distal arch approximately 4.6 x 4.2 cm and of the distal descending thoracic aorta approximately 5.2 x 5.2 cm.  INTERVAL HISTORY:  John Maddox is here today for repeat clinical assessment for stage IIIA non-small cell lung cancer. He finished his treatment in March, 2021, so he is over 4 years off treatment now. A pulmonologist performed a bronchoscopy on 02/20/2023. This revealed that the findings on the previous CT appear to be due to chronic atelectasis from dynamic airway collapse and chronic inflammation of the tracheobronchial tree. Pathology revealed no malignant cells identified and cultures revealed a large growth of Citrobacter so I gave him a course of antibiotics. Patient states that he feels ok but complains of chronic shortness of breath, mild cough, increasing extremity  weakness, and pain everywhere. He had a CT chest done on 04/11/2023 that revealed persistent bandlike consolidation in the right mid to lower lung, possibly superior right lower lobe, minimal tree-in-bud density at the right base suggesting infectious or inflammatory bronchiolitis, and multifocal aneurysmal dilatation of the aortic arch, descending thoracic aorta and proximal abdominal aorta. These are stable findings. He has a WBC of 5.0, low hemoglobin of 11.5, and platelet count of 162,000. His CMP is normal other than a low total protein of 6.3 and elevated alkaline phosphatase of 153. His CEA is pending and I will add a PSA to his labs today. I will see him back in 6 months with CBC, CMP, CEA, and port flush. If all is well, we can wait until next year to repeat a CT of the chest.   He denies fever, chills, night sweats, or other signs of infection. He denies gastrointestinal issues. His appetite is ok and he could not be weighed due to being wheelchair bound. This patient is accompanied in the office by his wife.     REVIEW OF SYSTEMS:  Review of Systems  Constitutional:  Positive for fatigue. Negative for appetite change, chills, diaphoresis, fever and unexpected weight change.  HENT:  Negative.  Negative for hearing loss, lump/mass, mouth sores, nosebleeds, sore throat, tinnitus, trouble swallowing and voice change.   Eyes: Negative.  Negative for eye problems and icterus.  Respiratory:  Positive for cough (mild) and shortness of breath (With exertion). Negative for chest tightness, hemoptysis and wheezing.   Cardiovascular: Negative.  Negative for chest pain, leg swelling and palpitations.  Gastrointestinal: Negative.  Negative for abdominal distention, abdominal pain, blood in stool, constipation, diarrhea, nausea, rectal pain and vomiting.  Genitourinary: Negative.  Negative for bladder incontinence, difficulty urinating, dyspareunia, dysuria, frequency, hematuria, nocturia, pelvic pain and  penile discharge.   Musculoskeletal:  Positive for arthralgias (Chronic, stable due to degenerative disease), back pain and gait problem (Due to degenerative disease). Negative for flank pain, myalgias, neck pain and neck stiffness.       Pain in back, hip, and shoulders  Skin: Negative.  Negative for itching, rash and wound.  Neurological:  Positive for extremity weakness (increasing) and gait problem (Due to degenerative disease). Negative for dizziness, headaches, light-headedness, numbness, seizures and speech difficulty.  Hematological:  Negative for adenopathy. Does not bruise/bleed easily.  Psychiatric/Behavioral: Negative.  Negative for confusion, decreased concentration, depression, sleep disturbance and suicidal ideas. The patient is not nervous/anxious.    VITALS:  Blood pressure 123/71, pulse 73, temperature 97.7 F (36.5 C), temperature source Oral, resp. rate 18, height 5\' 10"  (1.778 m), SpO2 96%.  Wt Readings from Last 3 Encounters:  01/29/23 236 lb 11.2 oz (107.4 kg)  12/25/22 235 lb (106.6 kg)  12/04/22 228 lb 12.8 oz (103.8 kg)    Body mass index is 33.96 kg/m.  Performance status (ECOG): 2 - Symptomatic, <50% confined to bed  PHYSICAL EXAM:  Physical Exam Vitals and nursing note reviewed. Exam conducted with a chaperone present.  Constitutional:      General: He is not in acute distress.    Appearance: Normal appearance. He is normal weight. He is not ill-appearing, toxic-appearing or diaphoretic.  HENT:     Head: Normocephalic and atraumatic.     Right Ear: Tympanic membrane, ear canal and external ear normal. There is no impacted cerumen.     Left Ear: Tympanic membrane, ear canal and external ear normal. There is no impacted cerumen.     Nose: Nose normal. No congestion or rhinorrhea.     Mouth/Throat:     Mouth: Mucous membranes are moist.     Pharynx: Oropharynx is clear. No oropharyngeal exudate or posterior oropharyngeal erythema.  Eyes:     General: No  scleral icterus.       Right eye: No discharge.        Left eye: No discharge.     Extraocular Movements: Extraocular movements intact.     Conjunctiva/sclera: Conjunctivae normal.     Pupils: Pupils are equal, round, and reactive to light.  Neck:     Vascular: No carotid bruit.  Cardiovascular:     Rate and Rhythm: Normal rate and regular rhythm.     Pulses: Normal pulses.     Heart sounds: Normal heart sounds. No murmur heard.    No friction rub. No gallop.  Pulmonary:     Effort: Pulmonary effort is normal. No respiratory distress.     Breath sounds: No stridor. Examination of the right-upper field reveals rhonchi. Examination of the right-middle field reveals rhonchi. Examination of the right-lower field reveals rhonchi. Wheezing and rhonchi present. No rales. Decreased breath sounds: mild.    Comments: Rhonchi scattered on the right and  slightly on the left side On oxygen  at 2L Chest:     Chest wall: No tenderness.  Abdominal:     General: Bowel sounds are normal. There is no distension.     Palpations: Abdomen is soft. There is no hepatomegaly, splenomegaly or mass.     Tenderness: There is no abdominal tenderness. There is no right CVA tenderness, left CVA tenderness, guarding or rebound.     Hernia: No hernia is present.  Musculoskeletal:        General: No swelling, tenderness, deformity or signs of injury. Normal range of motion.     Cervical back: Normal range of motion and neck supple. No rigidity or tenderness.     Right lower leg: 2+ Edema present.     Left lower leg: 2+ Edema present.  Lymphadenopathy:     Cervical: No cervical adenopathy.     Upper Body:     Right upper body: No supraclavicular or axillary adenopathy.     Left upper body: No supraclavicular or axillary adenopathy.     Lower Body: No right inguinal adenopathy. No left inguinal adenopathy.  Skin:    General: Skin is warm and dry.     Coloration: Skin is not jaundiced or pale.     Findings: No  bruising, erythema, lesion or rash.  Neurological:     General: No focal deficit present.     Mental Status: He is alert and oriented to person, place, and time. Mental status is at baseline.     Cranial Nerves: No cranial nerve deficit.     Sensory: No sensory deficit.     Motor: No weakness.     Coordination: Coordination normal.     Gait: Gait normal.     Deep Tendon Reflexes: Reflexes normal.  Psychiatric:        Mood and Affect: Mood normal.        Behavior: Behavior normal.        Thought Content: Thought content normal.        Judgment: Judgment normal.    LABS:      Latest Ref Rng & Units 06/13/2023    1:40 PM 03/05/2023   12:15 PM 05/30/2022   12:00 AM  CBC  WBC 4.0 - 10.5 K/uL 5.0  5.7  6.3      Hemoglobin 13.0 - 17.0 g/dL 28.4  13.2  44.0      Hematocrit 39.0 - 52.0 % 36.2  36.5  36      Platelets 150 - 400 K/uL 162  186  161         This result is from an external source.      Latest Ref Rng & Units 06/13/2023    1:40 PM 03/05/2023   12:15 PM 05/30/2022   12:00 AM  CMP  Glucose 70 - 99 mg/dL 91  102    BUN 8 - 23 mg/dL 17  19  19       Creatinine 0.61 - 1.24 mg/dL 7.25  3.66  1.1      Sodium 135 - 145 mmol/L 139  140  137      Potassium 3.5 - 5.1 mmol/L 4.4  4.3  4.5      Chloride 98 - 111 mmol/L 103  103  103      CO2 22 - 32 mmol/L 24  26  28       Calcium 8.9 - 10.3 mg/dL 9.2  9.4  8.8  Total Protein 6.5 - 8.1 g/dL 6.3  6.5    Total Bilirubin 0.0 - 1.2 mg/dL 0.4  0.4    Alkaline Phos 38 - 126 U/L 153  161  168      AST 15 - 41 U/L 22  28  28       ALT 0 - 44 U/L 18  15  22          This result is from an external source.   Lab Results  Component Value Date   CEA1 2.1 02/01/2021   CEA 2.02 06/13/2023   /  CEA  Date Value Ref Range Status  02/01/2021 2.1 0.0 - 4.7 ng/mL Final    Comment:    (NOTE)                             Nonsmokers          <3.9                             Smokers             <5.6 [employer] Electrochemiluminescence  Immunoassay (ECLIA) Values obtained with different assay methods or kits cannot be used interchangeably.  Results cannot be interpreted as absolute evidence of the presence or absence of malignant disease. Performed At: [facility] [address] [place], [place] [id number] Pearlean Botts MD WU:[id number]    CEA ([facility])  Date Value Ref Range Status  06/13/2023 2.02 0.00 - 5.00 ng/mL Final    Comment:    (NOTE) This test was performed using [employer]'s paramagnetic chemiluminescent immunoassay. Values obtained from different assay methods cannot be used interchangeably. Please note that up to 8% of patients who smoke may see values 5.1-10.0 ng/ml and 1% of patients who smoke may see CEA levels >10.0 ng/ml. Performed at [facility], [address] [place], [place] [address]    STUDIES:      HISTORY:   Past Medical History:  Diagnosis Date   AAA (abdominal aortic aneurysm) without rupture (HCC) 08/03/2015   AKI (acute kidney injury) (HCC) 01/08/2018   Last Assessment & Plan:  Improved Nephrology consulted Likely 2/2 ATN and CIN in the setting of contrast at the outside hospital on 12/21, NSAIDs at the outside hospital, intra-operative hypotension, and entresto  use. Avoid nephrotoxic medications(entresto /diuretic) Renally dose medications Strict I&O Monitor creatinine(1.68 today)     Anemia 01/17/2018   Last Assessment & Plan:  Due to chemotherapy and anemia of chronic disease Iron panel shows anemia of chronic disease Follow up with PCP and oncologist   Arrhythmia    Asthma 09/23/2017   Atherosclerosis of native artery of both lower extremities (HCC) 08/03/2015   Bilateral carotid artery stenosis 08/03/2015   Cardiomyopathy (HCC) 11/18/2017   Chronic anticoagulation 06/05/2018   Chronic combined systolic and diastolic heart failure (HCC) 10/03/2017   Congestive heart failure (HCC) 09/23/2017   COPD (chronic obstructive pulmonary disease) (HCC)  09/23/2017   Diabetes mellitus (HCC) 09/23/2017   Diarrhea 01/14/2018   Last Assessment & Plan:  C diff - negative  GIP - negative Resolved   Diastolic dysfunction 09/23/2017   Duodenal nodule 11/10/2014   Esophageal reflux 09/23/2017   Essential hypertension 11/10/2014   Last Assessment & Plan:  Controlled off of medication Follow up with PCP as an outpatient   Fatigue 09/23/2017   Gout 09/23/2017   Hematuria 01/14/2018  Last Assessment & Plan:  Need UA as outpatient.   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/06/2018   Insulin dependent type 2 diabetes mellitus (HCC) 11/10/2014   Last Assessment & Plan:  Glucose 109-140 Continue ISS Monitor glucose   Left ventricular dysfunction 10/03/2017   Lung cancer (HCC)    Lung mass 10/04/2017   Non-small cell lung cancer (HCC) 10/03/2017   Pulmonary emboli (HCC) 03/06/2018   Pulmonary embolism (HCC)    PVC's (premature ventricular contractions) 09/23/2017   PVD (peripheral vascular disease) (HCC) 11/10/2014   Septic arthritis of knee, left (HCC) 01/04/2018   Added automatically from request for surgery [id number]  Last Assessment & Plan:  Prosthetic knee infection S/p wash out Culture negative  Continue Dapto + Ceftriaxone  + Rifampin for 4 weeks(currently day 7) ID was notified today prior to discharge He will need CBC, CMP, ESR, CRP weekly faxed to [phone](OPAT with ID) ID outpatient appointment is on 02/17/2017  Antibiotics administered by port by ho   Thrombocytopenia (HCC) 01/15/2018   Last Assessment & Plan:   Decreased to 78.  Due to chemotherapy Peripheral smear negative for schistocytes LDH 211(within normal limits), haptoglobin 329(elevated), Retic 1.7 B12=714, Folate=20, copper pending    Past Surgical History:  Procedure Laterality Date   ABDOMINAL AORTIC ANEURYSM REPAIR     AORTA - ILIAC ARTERY BYPASS GRAFT     BACK SURGERY     CATARACT EXTRACTION, BILATERAL     CHOLECYSTECTOMY     HEMICOLECTOMY     HERNIA REPAIR     KNEE SURGERY Left    replaced all the hardwear   PROSTATE SURGERY     REPLACEMENT TOTAL KNEE     STENT PLACEMENT VASCULAR ([facility] HX)     TONSILLECTOMY      Family History  Problem Relation Age of Onset   Arthritis Mother    Lung cancer Mother    Hypertension Mother    Arthritis Father    Heart attack Father    Prostate cancer Father    Hypertension Father    Hyperlipidemia Father    Heart disease Father    Alcohol abuse Father    Arthritis Brother    Lung cancer Brother     Social History:  reports that he quit smoking about 11 years ago. His smoking use included cigarettes. He started smoking about 51 years ago. He has a 100 pack-year smoking history. He has been exposed to tobacco smoke. He has never used smokeless tobacco. He reports that he does not currently use alcohol. He reports that he does not currently use drugs.The patient is accompanied by his wife today.  Allergies:  Allergies  Allergen Reactions   Insulin Glargine     Stomach pain  Other Reaction(s): GI Intolerance  Stomach pain and urinary frequency   Quinolones Other (See Comments)    History of aortic aneurysm status post repair.  Use with caution    Current Medications: Current Outpatient Medications  Medication Sig Dispense Refill   torsemide  (DEMADEX ) 20 MG tablet  TAKE 1 TABLET DAILY, TAKE 1 EXTRA TABLET AT NOON ON MONDAY, WEDNESDAY, AND FRIDAY (Patient taking differently: Take 20 mg by mouth daily.) 130 tablet 3   allopurinol  (ZYLOPRIM ) 300 MG tablet Take 300 mg by mouth  daily.     apixaban  (ELIQUIS ) 5 MG TABS tablet Take 1 tablet (5 mg total) by mouth 2 (two) times daily. 180 tablet 3   carvedilol  (COREG ) 3.125 MG tablet TAKE 1 TABLET TWICE A DAY 180 tablet 1   celecoxib  (CELEBREX ) 200 MG capsule Take by mouth 2 (two) times daily.     cephALEXin  (KEFLEX ) 500 MG capsule Take 1 capsule (500 mg total) by mouth 3 (three) times daily. 30 capsule 0   ENTRESTO  97-103 MG TAKE 1 TABLET TWICE A DAY 180 tablet 3   fluticasone-salmeterol (ADVAIR HFA) 115-21 MCG/ACT inhaler Inhale 2 puffs into the lungs 2 (two) times daily.      gabapentin (NEURONTIN) 100 MG capsule Take 100 mg by mouth 2 (two) times daily.     metFORMIN (GLUCOPHAGE) 500 MG tablet Take 500 mg by mouth daily with breakfast. 2 tablets daily     omeprazole (PRILOSEC) 20 MG capsule Take 20 mg by mouth daily.     oxyCODONE  (OXY IR/ROXICODONE ) 5 MG immediate release tablet Take 1 tablet (5 mg total) by mouth every 6 (six) hours as needed for severe pain (pain score 7-10). 120 tablet 0   OXYGEN  Inhale into the lungs continuous. 2 liters     PROAIR  HFA 108 (90 Base) MCG/ACT inhaler Inhale 1 puff into the lungs every 6 (six) hours as needed.      simvastatin  (ZOCOR ) 40 MG tablet Take 1 tablet (40 mg total) by mouth daily. 90 tablet 3   SPIRIVA  RESPIMAT 2.5 MCG/ACT AERS SMARTSIG:2 Puff(s) Via Inhaler Daily     tamsulosin (FLOMAX) 0.4 MG CAPS capsule Take 0.4 mg by mouth daily.     No current facility-administered medications for this visit.    I,Jasmine M Lassiter,acting as a scribe for Nolia Baumgartner, MD.,have documented all relevant documentation on the behalf of Nolia Baumgartner, MD,as directed by  Nolia Baumgartner, MD while in the presence of Nolia Baumgartner, MD.

## 2023-06-13 ENCOUNTER — Inpatient Hospital Stay: Payer: Medicare Other | Attending: Oncology | Admitting: Oncology

## 2023-06-13 ENCOUNTER — Other Ambulatory Visit: Payer: Self-pay

## 2023-06-13 ENCOUNTER — Inpatient Hospital Stay: Payer: Medicare Other

## 2023-06-13 ENCOUNTER — Telehealth: Payer: Self-pay | Admitting: Oncology

## 2023-06-13 ENCOUNTER — Encounter: Payer: Self-pay | Admitting: Oncology

## 2023-06-13 ENCOUNTER — Other Ambulatory Visit: Payer: Self-pay | Admitting: Oncology

## 2023-06-13 VITALS — BP 123/71 | HR 73 | Temp 97.7°F | Resp 18 | Ht 70.0 in

## 2023-06-13 DIAGNOSIS — Z85118 Personal history of other malignant neoplasm of bronchus and lung: Secondary | ICD-10-CM | POA: Diagnosis not present

## 2023-06-13 DIAGNOSIS — C3491 Malignant neoplasm of unspecified part of right bronchus or lung: Secondary | ICD-10-CM

## 2023-06-13 DIAGNOSIS — Z452 Encounter for adjustment and management of vascular access device: Secondary | ICD-10-CM | POA: Insufficient documentation

## 2023-06-13 DIAGNOSIS — Z79899 Other long term (current) drug therapy: Secondary | ICD-10-CM | POA: Insufficient documentation

## 2023-06-13 DIAGNOSIS — I7123 Aneurysm of the descending thoracic aorta, without rupture: Secondary | ICD-10-CM | POA: Insufficient documentation

## 2023-06-13 DIAGNOSIS — Z7901 Long term (current) use of anticoagulants: Secondary | ICD-10-CM | POA: Insufficient documentation

## 2023-06-13 DIAGNOSIS — Z8546 Personal history of malignant neoplasm of prostate: Secondary | ICD-10-CM | POA: Insufficient documentation

## 2023-06-13 LAB — CBC WITH DIFFERENTIAL (CANCER CENTER ONLY)
Abs Immature Granulocytes: 0.01 10*3/uL (ref 0.00–0.07)
Basophils Absolute: 0 10*3/uL (ref 0.0–0.1)
Basophils Relative: 1 %
Eosinophils Absolute: 0.2 10*3/uL (ref 0.0–0.5)
Eosinophils Relative: 5 %
HCT: 36.2 % — ABNORMAL LOW (ref 39.0–52.0)
Hemoglobin: 11.5 g/dL — ABNORMAL LOW (ref 13.0–17.0)
Immature Granulocytes: 0 %
Lymphocytes Relative: 23 %
Lymphs Abs: 1.1 10*3/uL (ref 0.7–4.0)
MCH: 28.3 pg (ref 26.0–34.0)
MCHC: 31.8 g/dL (ref 30.0–36.0)
MCV: 89.2 fL (ref 80.0–100.0)
Monocytes Absolute: 0.4 10*3/uL (ref 0.1–1.0)
Monocytes Relative: 8 %
Neutro Abs: 3.2 10*3/uL (ref 1.7–7.7)
Neutrophils Relative %: 63 %
Platelet Count: 162 10*3/uL (ref 150–400)
RBC: 4.06 MIL/uL — ABNORMAL LOW (ref 4.22–5.81)
RDW: 16.3 % — ABNORMAL HIGH (ref 11.5–15.5)
WBC Count: 5 10*3/uL (ref 4.0–10.5)
nRBC: 0 % (ref 0.0–0.2)

## 2023-06-13 LAB — CMP (CANCER CENTER ONLY)
ALT: 18 U/L (ref 0–44)
AST: 22 U/L (ref 15–41)
Albumin: 3.8 g/dL (ref 3.5–5.0)
Alkaline Phosphatase: 153 U/L — ABNORMAL HIGH (ref 38–126)
Anion gap: 12 (ref 5–15)
BUN: 17 mg/dL (ref 8–23)
CO2: 24 mmol/L (ref 22–32)
Calcium: 9.2 mg/dL (ref 8.9–10.3)
Chloride: 103 mmol/L (ref 98–111)
Creatinine: 1.13 mg/dL (ref 0.61–1.24)
GFR, Estimated: >60 mL/min (ref >60)
Glucose, Bld: 91 mg/dL (ref 70–99)
Potassium: 4.4 mmol/L (ref 3.5–5.1)
Sodium: 139 mmol/L (ref 135–145)
Total Bilirubin: 0.4 mg/dL (ref 0.0–1.2)
Total Protein: 6.3 g/dL — ABNORMAL LOW (ref 6.5–8.1)

## 2023-06-13 LAB — CEA (ACCESS): CEA (CHCC): 2.02 ng/mL (ref 0.00–5.00)

## 2023-06-13 LAB — PSA: Prostatic Specific Antigen: 1.07 ng/mL (ref 0.00–4.00)

## 2023-06-13 MED ORDER — SODIUM CHLORIDE 0.9% FLUSH
10.0000 mL | INTRAVENOUS | Status: DC | PRN
  Administered 2023-06-13: 10 mL

## 2023-06-13 MED ORDER — HEPARIN SOD (PORK) LOCK FLUSH 100 UNIT/ML IV SOLN
500.0000 [IU] | Freq: Once | INTRAVENOUS | Status: AC | PRN
  Administered 2023-06-13: 500 [IU]

## 2023-06-13 NOTE — Telephone Encounter (Signed)
 Patient has been scheduled for follow-up visit per 06/13/23 LOS.  Pt given an appt calendar with date and time.

## 2023-06-19 ENCOUNTER — Telehealth: Payer: Self-pay

## 2023-06-19 ENCOUNTER — Encounter: Payer: Self-pay | Admitting: Hematology and Oncology

## 2023-06-19 NOTE — Telephone Encounter (Signed)
-----   Message from Nolia Baumgartner sent at 06/19/2023  7:07 AM EDT ----- Regarding: call Can tell them PSA and CEA are normal (tumor markers)

## 2023-06-19 NOTE — Telephone Encounter (Signed)
 Attempted to contact patient. No answer.

## 2023-06-24 ENCOUNTER — Other Ambulatory Visit: Payer: Self-pay

## 2023-06-24 DIAGNOSIS — G8929 Other chronic pain: Secondary | ICD-10-CM

## 2023-06-24 MED ORDER — OXYCODONE HCL 5 MG PO TABS: 5.0000 mg | ORAL_TABLET | Freq: Four times a day (QID) | ORAL | 0 refills | Status: DC | PRN

## 2023-06-26 ENCOUNTER — Telehealth: Payer: Self-pay

## 2023-06-26 NOTE — Telephone Encounter (Signed)
Attempted to contact patient. NO answer  

## 2023-06-26 NOTE — Telephone Encounter (Signed)
-----   Message from Nolia Baumgartner sent at 06/19/2023  7:07 AM EDT ----- Regarding: call Can tell them PSA and CEA are normal (tumor markers)

## 2023-06-28 ENCOUNTER — Telehealth: Payer: Self-pay

## 2023-06-28 DIAGNOSIS — E1169 Type 2 diabetes mellitus with other specified complication: Secondary | ICD-10-CM | POA: Diagnosis not present

## 2023-06-28 DIAGNOSIS — I712 Thoracic aortic aneurysm, without rupture, unspecified: Secondary | ICD-10-CM | POA: Diagnosis not present

## 2023-06-28 DIAGNOSIS — I110 Hypertensive heart disease with heart failure: Secondary | ICD-10-CM | POA: Diagnosis not present

## 2023-06-28 DIAGNOSIS — J449 Chronic obstructive pulmonary disease, unspecified: Secondary | ICD-10-CM | POA: Diagnosis not present

## 2023-06-28 DIAGNOSIS — K219 Gastro-esophageal reflux disease without esophagitis: Secondary | ICD-10-CM | POA: Diagnosis not present

## 2023-06-28 DIAGNOSIS — Z86711 Personal history of pulmonary embolism: Secondary | ICD-10-CM | POA: Diagnosis not present

## 2023-06-28 DIAGNOSIS — E785 Hyperlipidemia, unspecified: Secondary | ICD-10-CM | POA: Diagnosis not present

## 2023-06-28 DIAGNOSIS — T451X5A Adverse effect of antineoplastic and immunosuppressive drugs, initial encounter: Secondary | ICD-10-CM | POA: Diagnosis not present

## 2023-06-28 DIAGNOSIS — M109 Gout, unspecified: Secondary | ICD-10-CM | POA: Diagnosis not present

## 2023-06-28 DIAGNOSIS — Z8546 Personal history of malignant neoplasm of prostate: Secondary | ICD-10-CM | POA: Diagnosis not present

## 2023-06-28 DIAGNOSIS — Z85118 Personal history of other malignant neoplasm of bronchus and lung: Secondary | ICD-10-CM | POA: Diagnosis not present

## 2023-06-28 DIAGNOSIS — G620 Drug-induced polyneuropathy: Secondary | ICD-10-CM | POA: Diagnosis not present

## 2023-06-28 NOTE — Telephone Encounter (Signed)
 Attempted to contact patient. No answer.

## 2023-06-28 NOTE — Telephone Encounter (Signed)
-----   Message from Nolia Baumgartner sent at 06/19/2023  7:07 AM EDT ----- Regarding: call Can tell them PSA and CEA are normal (tumor markers)

## 2023-07-02 DIAGNOSIS — R972 Elevated prostate specific antigen [PSA]: Secondary | ICD-10-CM | POA: Insufficient documentation

## 2023-07-02 DIAGNOSIS — L989 Disorder of the skin and subcutaneous tissue, unspecified: Secondary | ICD-10-CM | POA: Insufficient documentation

## 2023-07-02 DIAGNOSIS — M545 Low back pain, unspecified: Secondary | ICD-10-CM | POA: Insufficient documentation

## 2023-07-02 DIAGNOSIS — G609 Hereditary and idiopathic neuropathy, unspecified: Secondary | ICD-10-CM

## 2023-07-02 DIAGNOSIS — E669 Obesity, unspecified: Secondary | ICD-10-CM | POA: Insufficient documentation

## 2023-07-02 DIAGNOSIS — F1999 Other psychoactive substance use, unspecified with unspecified psychoactive substance-induced disorder: Secondary | ICD-10-CM | POA: Insufficient documentation

## 2023-07-02 DIAGNOSIS — R609 Edema, unspecified: Secondary | ICD-10-CM

## 2023-07-02 DIAGNOSIS — Z720 Tobacco use: Secondary | ICD-10-CM

## 2023-07-02 HISTORY — DX: Other psychoactive substance use, unspecified with unspecified psychoactive substance-induced disorder: F19.99

## 2023-07-02 HISTORY — DX: Low back pain, unspecified: M54.50

## 2023-07-02 HISTORY — DX: Obesity, unspecified: E66.9

## 2023-07-02 HISTORY — DX: Tobacco use: Z72.0

## 2023-07-02 HISTORY — DX: Hereditary and idiopathic neuropathy, unspecified: G60.9

## 2023-07-02 HISTORY — DX: Elevated prostate specific antigen (PSA): R97.20

## 2023-07-02 HISTORY — DX: Edema, unspecified: R60.9

## 2023-07-02 HISTORY — DX: Disorder of the skin and subcutaneous tissue, unspecified: L98.9

## 2023-07-08 ENCOUNTER — Ambulatory Visit: Admitting: Cardiology

## 2023-07-08 ENCOUNTER — Ambulatory Visit

## 2023-07-08 VITALS — BP 108/66 | HR 78 | Ht 70.0 in | Wt 231.2 lb

## 2023-07-08 DIAGNOSIS — I499 Cardiac arrhythmia, unspecified: Secondary | ICD-10-CM

## 2023-07-08 DIAGNOSIS — F1999 Other psychoactive substance use, unspecified with unspecified psychoactive substance-induced disorder: Secondary | ICD-10-CM

## 2023-07-08 DIAGNOSIS — I739 Peripheral vascular disease, unspecified: Secondary | ICD-10-CM

## 2023-07-08 DIAGNOSIS — Z720 Tobacco use: Secondary | ICD-10-CM

## 2023-07-08 DIAGNOSIS — Z7901 Long term (current) use of anticoagulants: Secondary | ICD-10-CM

## 2023-07-08 DIAGNOSIS — J45909 Unspecified asthma, uncomplicated: Secondary | ICD-10-CM

## 2023-07-08 DIAGNOSIS — K3189 Other diseases of stomach and duodenum: Secondary | ICD-10-CM

## 2023-07-08 DIAGNOSIS — I5032 Chronic diastolic (congestive) heart failure: Secondary | ICD-10-CM | POA: Insufficient documentation

## 2023-07-08 DIAGNOSIS — I10 Essential (primary) hypertension: Secondary | ICD-10-CM | POA: Insufficient documentation

## 2023-07-08 DIAGNOSIS — Z96659 Presence of unspecified artificial knee joint: Secondary | ICD-10-CM

## 2023-07-08 DIAGNOSIS — M545 Low back pain, unspecified: Secondary | ICD-10-CM

## 2023-07-08 DIAGNOSIS — K219 Gastro-esophageal reflux disease without esophagitis: Secondary | ICD-10-CM

## 2023-07-08 DIAGNOSIS — N179 Acute kidney failure, unspecified: Secondary | ICD-10-CM

## 2023-07-08 DIAGNOSIS — J189 Pneumonia, unspecified organism: Secondary | ICD-10-CM

## 2023-07-08 DIAGNOSIS — N138 Other obstructive and reflux uropathy: Secondary | ICD-10-CM

## 2023-07-08 DIAGNOSIS — I70203 Unspecified atherosclerosis of native arteries of extremities, bilateral legs: Secondary | ICD-10-CM

## 2023-07-08 DIAGNOSIS — I428 Other cardiomyopathies: Secondary | ICD-10-CM

## 2023-07-08 DIAGNOSIS — R9389 Abnormal findings on diagnostic imaging of other specified body structures: Secondary | ICD-10-CM

## 2023-07-08 DIAGNOSIS — C3490 Malignant neoplasm of unspecified part of unspecified bronchus or lung: Secondary | ICD-10-CM

## 2023-07-08 DIAGNOSIS — E785 Hyperlipidemia, unspecified: Secondary | ICD-10-CM

## 2023-07-08 DIAGNOSIS — D696 Thrombocytopenia, unspecified: Secondary | ICD-10-CM

## 2023-07-08 DIAGNOSIS — G609 Hereditary and idiopathic neuropathy, unspecified: Secondary | ICD-10-CM

## 2023-07-08 DIAGNOSIS — I509 Heart failure, unspecified: Secondary | ICD-10-CM

## 2023-07-08 DIAGNOSIS — Z8546 Personal history of malignant neoplasm of prostate: Secondary | ICD-10-CM

## 2023-07-08 DIAGNOSIS — M109 Gout, unspecified: Secondary | ICD-10-CM

## 2023-07-08 DIAGNOSIS — R319 Hematuria, unspecified: Secondary | ICD-10-CM

## 2023-07-08 DIAGNOSIS — E669 Obesity, unspecified: Secondary | ICD-10-CM

## 2023-07-08 DIAGNOSIS — I2699 Other pulmonary embolism without acute cor pulmonale: Secondary | ICD-10-CM

## 2023-07-08 DIAGNOSIS — J449 Chronic obstructive pulmonary disease, unspecified: Secondary | ICD-10-CM

## 2023-07-08 DIAGNOSIS — E782 Mixed hyperlipidemia: Secondary | ICD-10-CM | POA: Insufficient documentation

## 2023-07-08 DIAGNOSIS — C3411 Malignant neoplasm of upper lobe, right bronchus or lung: Secondary | ICD-10-CM

## 2023-07-08 DIAGNOSIS — I110 Hypertensive heart disease with heart failure: Secondary | ICD-10-CM

## 2023-07-08 DIAGNOSIS — R972 Elevated prostate specific antigen [PSA]: Secondary | ICD-10-CM

## 2023-07-08 DIAGNOSIS — I6523 Occlusion and stenosis of bilateral carotid arteries: Secondary | ICD-10-CM

## 2023-07-08 DIAGNOSIS — I5042 Chronic combined systolic (congestive) and diastolic (congestive) heart failure: Secondary | ICD-10-CM

## 2023-07-08 DIAGNOSIS — E1322 Other specified diabetes mellitus with diabetic chronic kidney disease: Secondary | ICD-10-CM

## 2023-07-08 DIAGNOSIS — I5189 Other ill-defined heart diseases: Secondary | ICD-10-CM

## 2023-07-08 DIAGNOSIS — E876 Hypokalemia: Secondary | ICD-10-CM

## 2023-07-08 DIAGNOSIS — I493 Ventricular premature depolarization: Secondary | ICD-10-CM

## 2023-07-08 DIAGNOSIS — O1200 Gestational edema, unspecified trimester: Secondary | ICD-10-CM

## 2023-07-08 DIAGNOSIS — R5383 Other fatigue: Secondary | ICD-10-CM

## 2023-07-08 DIAGNOSIS — I159 Secondary hypertension, unspecified: Secondary | ICD-10-CM

## 2023-07-08 DIAGNOSIS — D649 Anemia, unspecified: Secondary | ICD-10-CM

## 2023-07-08 DIAGNOSIS — L989 Disorder of the skin and subcutaneous tissue, unspecified: Secondary | ICD-10-CM

## 2023-07-08 DIAGNOSIS — M009 Pyogenic arthritis, unspecified: Secondary | ICD-10-CM

## 2023-07-08 DIAGNOSIS — Z794 Long term (current) use of insulin: Secondary | ICD-10-CM

## 2023-07-08 DIAGNOSIS — I714 Abdominal aortic aneurysm, without rupture, unspecified: Secondary | ICD-10-CM

## 2023-07-08 DIAGNOSIS — R918 Other nonspecific abnormal finding of lung field: Secondary | ICD-10-CM

## 2023-07-08 DIAGNOSIS — I519 Heart disease, unspecified: Secondary | ICD-10-CM

## 2023-07-08 NOTE — Assessment & Plan Note (Signed)
 Target systolic blood pressure below 879 mmHg. Continue carvedilol  and Entresto  at current doses. If persistently low blood pressures with systolic blood pressure below 899 and symptomatic, can switch from carvedilol  to metoprolol.

## 2023-07-08 NOTE — Progress Notes (Signed)
 Cardiology Consultation:    Date:  07/08/2023   ID:  John Maddox. John, Maddox 11-Jun-1940, MRN [id number]  PCP:  John Vicenta BRAVO, MD  Cardiologist:  John SAUNDERS Kemoni Ortega, MD   Referring MD: John Vicenta BRAVO, MD   No chief complaint on file.    ASSESSMENT AND PLAN:   Mr. John Maddox is an 83 year old male with history of chronic CHF with recovered LVEF [echocardiogram September 2023 EF 55 to 60%; was reduced 45 to 50% in September 2019 on echocardiogram at [facility] in ischemic workup with Lexiscan nuclear imaging at the time showed no ischemia], hypertension, massive pulmonary embolism remains on chronic anticoagulation, thoracic aortic aneurysm and AAA previously underwent EVAR 2006 follows up with vascular surgery at [facility] and he decided against pursuing vascular surgery, hyperlipidemia, COPD, lung cancer s/p chemoradiation therapy 2019 and follows up with Dr. Cornelius.  Here for follow-up visit.  Problem List Items Addressed This Visit     Hyperlipidemia   Continue simvastatin  40 mg once daily Last lipid panel 06/28/2023 total cholesterol 125, HDL 40, LDL 65, triglycerides 106. Well-controlled.       Hypertension   Target systolic blood pressure below 879 mmHg. Continue carvedilol  and Entresto  at current doses. If persistently low blood pressures with systolic blood pressure below 899 and symptomatic, can switch from carvedilol  to metoprolol.      Congestive heart failure [facility]) - Primary    recovered LVEF [echocardiogram September 2023 EF 55 to 60%; was reduced 45 to 50% in September 2019 on echocardiogram at [facility] in ischemic workup with Lexiscan nuclear imaging at the time showed no ischemia]  Appears hypervolemic with bilateral lower extremity pitting pedal edema extending up to the knees. He mentions this is chronic and stable.  Weight today 231 pounds.    Continue with salt restriction to below 2 g/day and fluid restriction to below 2 L/day. Advised to  continue torsemide  20 mg daily in the morning and take torsemide  in the afternoon on 2 days in the week preferably Monday and Thursday.  Continue Entresto  97 mg - 103 mg twice daily Continue carvedilol  3.125 mg twice daily. If blood pressures are persistently low below 100 mmHg systolic, can switch carvedilol  to metoprolol tartrate.         Mentions coming in for follow-up visits is becoming difficult. Will tentatively schedule for 80-month follow-up visit, if things look stable and he would like to hold off on visit it can be moved down by another 3 to 4 months after that.     History of Present Illness:    John Maddox is a 83 y.o. male who is being seen today for follow-up visit. PCP is John Vicenta BRAVO, MD. Last visit with us  in the office was 11-15-2022 with Dr. Monetta.  Here for the visit today accompanied by his wife.  Uses a wheelchair to ambulate.  Mentions overall it has been becoming difficult for him to come in for appointments due to how tired he gets.  Chronic CHF with recovered LVEF [echocardiogram September 2023 EF 55 to 60%; was reduced 45 to 50% in September 2019 on echocardiogram at [facility] in ischemic workup with Lexiscan nuclear imaging at the time showed no ischemia], hypertension, massive pulmonary embolism remains on chronic anticoagulation, thoracic aortic aneurysm and AAA previously underwent EVAR 2006 follows up with vascular surgery at [facility] and he decided against pursuing vascular surgery, hyperlipidemia, COPD, lung cancer s/p chemoradiation therapy 2019 and follows up with Dr. Cornelius.  Mentions  overall he feels things have been stable. Denies any chest pain. Gets easily out of breath.  Has significant pedal edema. Feels tired and weak when the blood pressures are low with systolic in 90s. Mentions blood pressures at home using the upper arm cuff are usually controlled and the highest they have seen is 130.   Denies any syncopal  episode. Denies any blood in urine or stools.  Has not weighed himself recently and thinks he is around 235 pounds.  Declined to get weighed this afternoon given difficulty having to get out of the wheelchair.  But is willing to get weighed today after discussion with me.  Restates that he has decided not to pursue any intervention for his aneurysm understands the risk of potential rupture.  Has not been following up with vascular surgery anymore given this.  The reason for this is given the major risk associated with the surgery and his overall poor candidacy for surgery.  This I think is reasonable.  Last CMP from 06-13-2023 BUN 17, creatinine 1.13, EGFR greater than 60. Normal transaminases. Alkaline phosphatase mildly elevated 153, still lower than prior readings.   Past Medical History:  Diagnosis Date   AAA (abdominal aortic aneurysm) without rupture (HCC) 08/03/2015   Abnormal CT of the chest 12/24/2022   AKI (acute kidney injury) (HCC) 01/08/2018   Last Assessment & Plan:  Improved Nephrology consulted Likely 2/2 ATN and CIN in the setting of contrast at the outside hospital on 12/21, NSAIDs at the outside hospital, intra-operative hypotension, and entresto  use. 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/2021   Added automatically from request for surgery [id number]     Cardiomyopathy (HCC) 11/18/2017   Chronic anticoagulation 06/05/2018   Chronic combined systolic and diastolic heart failure (HCC) 10/03/2017   Congestive  heart failure (HCC) 09/23/2017   COPD (chronic obstructive pulmonary disease) (HCC) 09/23/2017   Diabetes mellitus (HCC) 09/23/2017   Diarrhea 01/14/2018   Last Assessment & Plan:  C diff - negative  GIP - negative Resolved   Diastolic dysfunction 09/23/2017   Disorder of skin or subcutaneous tissue 07/02/2023   Drug-induced mental disorder (HCC) 07/02/2023   Duodenal nodule 11/10/2014   Edema 07/02/2023   Elevated prostate specific antigen (PSA) 07/02/2023   Esophageal reflux 09/23/2017   Essential hypertension 11/10/2014   Last Assessment & Plan:  Controlled off of medication Follow up with PCP as an outpatient   Fatigue 09/23/2017   Gout 09/23/2017   Hematuria 01/14/2018   Last Assessment & Plan:  Need UA as outpatient.   Hereditary and idiopathic peripheral neuropathy 07/02/2023   History of prostate cancer 09/23/2017   Hyperlipidemia 09/23/2017   Hypertension 09/23/2017   Hypertensive heart disease 09/23/2017   Hypertensive heart disease with heart failure (HCC) 06/06/2018   Hypokalemia 01/14/2018   Last Assessment & Plan:  Replace and follow up with PCP   Hypomagnesemia 01/14/2018   Last Assessment & Plan:  Replace and monitor and follow up with PCP   Hypophosphatemia 01/15/2018   Last Assessment & Plan:  Resolved   Infected  prosthetic knee joint, initial encounter (HCC) 03/06/2018   Insulin dependent type 2 diabetes mellitus (HCC) 11/10/2014   Last Assessment & Plan:  Glucose 109-140 Continue ISS Monitor glucose   Left ventricular dysfunction 10/03/2017   Low back pain 07/02/2023   Lung mass 10/04/2017   Non-small cell lung cancer (HCC) 10/03/2017   Obesity 07/02/2023   Postobstructive pneumonia 12/25/2022   Pulmonary emboli (HCC) 03/06/2018   Pulmonary embolism (HCC)    PVC's (premature ventricular contractions) 09/23/2017   PVD (peripheral vascular disease) (HCC) 11/10/2014   Septic arthritis of knee, left (HCC) 01/04/2018   Added automatically from request for  surgery [id number]  Last Assessment & Plan:  Prosthetic knee infection S/p wash out Culture negative  Continue Dapto + Ceftriaxone  + Rifampin for 4 weeks(currently day 7) ID was notified today prior to discharge He will need CBC, CMP, ESR, CRP weekly faxed to [phone](OPAT with ID) ID outpatient appointment is on 02/17/2017  Antibiotics administered by port by ho   Thrombocytopenia (HCC) 01/15/2018   Last Assessment & Plan:  Decreased to 78.  Due to chemotherapy Peripheral smear negative for schistocytes LDH 211(within normal limits), haptoglobin 329(elevated), Retic 1.7 B12=714, Folate=20, copper pending   Tobacco user 07/02/2023    Past Surgical History:  Procedure Laterality Date   ABDOMINAL AORTIC ANEURYSM REPAIR     AORTA - ILIAC ARTERY BYPASS GRAFT     BACK SURGERY     CATARACT EXTRACTION, BILATERAL     CHOLECYSTECTOMY     HEMICOLECTOMY     HERNIA REPAIR     KNEE SURGERY Left    replaced all the hardwear   PROSTATE SURGERY     REPLACEMENT TOTAL KNEE     STENT PLACEMENT VASCULAR ([facility] HX)     TONSILLECTOMY      Current Medications: Current Meds  Medication Sig   allopurinol  (ZYLOPRIM ) 300 MG tablet Take 300 mg by mouth daily.   apixaban  (ELIQUIS ) 5 MG TABS tablet Take 1 tablet (5 mg total) by mouth 2 (two) times daily.   carvedilol  (COREG ) 3.125 MG tablet TAKE 1 TABLET TWICE A DAY   celecoxib  (CELEBREX ) 200 MG capsule Take by mouth 2 (two) times daily.   ENTRESTO  97-103 MG TAKE 1 TABLET TWICE A DAY   fluticasone-salmeterol (ADVAIR HFA) 115-21 MCG/ACT inhaler Inhale 2 puffs into the lungs 2 (two) times daily.    gabapentin (NEURONTIN) 100 MG capsule Take 100 mg by mouth 2 (two) times daily.   metFORMIN (GLUCOPHAGE) 500 MG tablet Take 500 mg by mouth daily with breakfast. 2 tablets daily   omeprazole (PRILOSEC) 20 MG capsule Take 20 mg by mouth daily.   oxyCODONE  (OXY IR/ROXICODONE ) 5 MG immediate release tablet Take 1 tablet (5 mg total) by mouth every 6 (six) hours as needed for  severe pain (pain score 7-10).   OXYGEN  Inhale into the lungs continuous. 2 liters   PROAIR  HFA 108 (90 Base) MCG/ACT inhaler Inhale 1 puff into the lungs every 6 (six) hours as needed.    simvastatin  (ZOCOR ) 40 MG tablet Take 1 tablet (40 mg total) by mouth daily.   SPIRIVA  RESPIMAT 2.5 MCG/ACT AERS Inhale 2 puffs into the lungs daily.   tamsulosin (FLOMAX) 0.4 MG CAPS capsule Take 0.4 mg by mouth daily.     Allergies:   Insulin glargine and Quinolones   Social History   Socioeconomic History   Marital status: Married    Spouse name: Not on file   Number of children: Not on file  Years of education: Not on file   Highest education level: Not on file  Occupational History   Not on file  Tobacco Use   Smoking status: Former    Current packs/day: 0.00    Average packs/day: 2.5 packs/day for 40.0 years (100.0 ttl pk-yrs)    Types: Cigarettes    Start date: 40    Quit date: 2014    Years since quitting: 11.4    Passive exposure: Past   Smokeless tobacco: Never  Vaping Use   Vaping status: Never Used  Substance and Sexual Activity   Alcohol use: Not Currently   Drug use: Not Currently   Sexual activity: Not on file  Other Topics Concern   Not on file  Social History Narrative   Not on file   Social Drivers of Health   Financial Resource Strain: Not on file  Food Insecurity: Low Risk  (01/14/2023)   Received from [facility]   Hunger Vital Sign    Within the past 12 months, you worried that your food would run out before you got money to buy more: Never true    Within the past 12 months, the food you bought just didn't last and you didn't have money to get more. : Never true  Transportation Needs: No Transportation Needs (01/14/2023)   Received from [facility]    In the past 12 months, has lack of reliable transportation kept you from medical appointments, meetings, work or from getting things needed for daily living? : No  Physical Activity:  Not on file  Stress: Not on file  Social Connections: Not on file     Family History: The patient's family history includes Alcohol abuse in his father; Arthritis in his brother, father, and mother; Heart attack in his father; Heart disease in his father; Hyperlipidemia in his father; Hypertension in his father and mother; Lung cancer in his brother and mother; Prostate cancer in his father. ROS:   Please see the history of present illness.    All 14 point review of systems negative except as described per history of present illness.  EKGs/Labs/Other Studies Reviewed:    The following studies were reviewed today:   EKG:       Recent Labs: 06/13/2023: ALT 18; BUN 17; Creatinine 1.13; Hemoglobin 11.5; Platelet Count 162; Potassium 4.4; Sodium 139  Recent Lipid Panel No results found for: CHOL, TRIG, HDL, CHOLHDL, VLDL, LDLCALC, LDLDIRECT  Physical Exam:    VS:  BP 108/66   Pulse 78   Ht 5' 10 (1.778 m)   Wt 231 lb 3.2 oz (104.9 kg)   SpO2 95%   BMI 33.17 kg/m     Wt Readings from Last 3 Encounters:  07/08/23 231 lb 3.2 oz (104.9 kg)  01/29/23 236 lb 11.2 oz (107.4 kg)  12/25/22 235 lb (106.6 kg)     GENERAL:  Well nourished, remains on nasal oxygen  NECK: No JVD; No carotid bruits CARDIAC: RRR, S1 and S2 present, no murmurs, no rubs, no gallops CHEST: Reduced air entry on left lung fields.  No significant crackles Extremities: 2+ bilateral pitting pedal edema extending up to the knees. Pulses bilaterally symmetric with radial 2+ and dorsalis pedis 2+ NEUROLOGIC:  Alert and oriented x 3  Medication Adjustments/Labs and Tests Ordered: Current medicines are reviewed at length with the patient today.  Concerns regarding medicines are outlined above.  No orders of the defined types were placed in this encounter.  No orders of  the defined types were placed in this encounter.   Signed, John jess Kobus, MD, MPH, [facility] 07/08/2023 2:03 PM    [facility]

## 2023-07-08 NOTE — Patient Instructions (Signed)

## 2023-07-08 NOTE — Assessment & Plan Note (Signed)
 Continue simvastatin  40 mg once daily Last lipid panel 06/28/2023 total cholesterol 125, HDL 40, LDL 65, triglycerides 106. Well-controlled.

## 2023-07-08 NOTE — Assessment & Plan Note (Addendum)
 recovered LVEF [echocardiogram September 2023 EF 55 to 60%; was reduced 45 to 50% in September 2019 on echocardiogram at [facility] in ischemic workup with Lexiscan nuclear imaging at the time showed no ischemia]  Appears hypervolemic with bilateral lower extremity pitting pedal edema extending up to the knees. He mentions this is chronic and stable.  Weight today 231 pounds.    Continue with salt restriction to below 2 g/day and fluid restriction to below 2 L/day. Advised to continue torsemide  20 mg daily in the morning and take torsemide  in the afternoon on 2 days in the week preferably Monday and Thursday.  Continue Entresto  97 mg - 103 mg twice daily Continue carvedilol  3.125 mg twice daily. If blood pressures are persistently low below 100 mmHg systolic, can switch carvedilol  to metoprolol tartrate.

## 2023-07-24 ENCOUNTER — Other Ambulatory Visit: Payer: Self-pay

## 2023-07-24 DIAGNOSIS — G8929 Other chronic pain: Secondary | ICD-10-CM

## 2023-07-24 MED ORDER — OXYCODONE HCL 5 MG PO TABS: 5.0000 mg | ORAL_TABLET | Freq: Four times a day (QID) | ORAL | 0 refills | Status: DC | PRN

## 2023-08-22 ENCOUNTER — Other Ambulatory Visit: Payer: Self-pay | Admitting: Cardiology

## 2023-08-23 ENCOUNTER — Other Ambulatory Visit: Payer: Self-pay

## 2023-08-23 DIAGNOSIS — G8929 Other chronic pain: Secondary | ICD-10-CM

## 2023-08-23 MED ORDER — OXYCODONE HCL 5 MG PO TABS: 5.0000 mg | ORAL_TABLET | Freq: Four times a day (QID) | ORAL | 0 refills | Status: DC | PRN

## 2023-09-23 ENCOUNTER — Other Ambulatory Visit: Payer: Self-pay

## 2023-09-23 DIAGNOSIS — G8929 Other chronic pain: Secondary | ICD-10-CM

## 2023-09-23 MED ORDER — OXYCODONE HCL 5 MG PO TABS
5.0000 mg | ORAL_TABLET | Freq: Four times a day (QID) | ORAL | 0 refills | Status: DC | PRN
Start: 2023-09-23 — End: 2023-10-24

## 2023-10-24 ENCOUNTER — Other Ambulatory Visit: Payer: Self-pay

## 2023-10-24 DIAGNOSIS — G8929 Other chronic pain: Secondary | ICD-10-CM

## 2023-10-24 MED ORDER — OXYCODONE HCL 5 MG PO TABS: 5.0000 mg | ORAL_TABLET | Freq: Four times a day (QID) | ORAL | 0 refills | Status: DC | PRN

## 2023-11-04 ENCOUNTER — Other Ambulatory Visit: Payer: Self-pay | Admitting: Cardiology

## 2023-11-22 ENCOUNTER — Other Ambulatory Visit: Payer: Self-pay

## 2023-11-22 DIAGNOSIS — G8929 Other chronic pain: Secondary | ICD-10-CM

## 2023-11-22 MED ORDER — OXYCODONE HCL 5 MG PO TABS: 5.0000 mg | ORAL_TABLET | Freq: Four times a day (QID) | ORAL | 0 refills | Status: DC | PRN

## 2023-12-06 DIAGNOSIS — Z23 Encounter for immunization: Secondary | ICD-10-CM | POA: Diagnosis not present

## 2023-12-06 DIAGNOSIS — M109 Gout, unspecified: Secondary | ICD-10-CM | POA: Diagnosis not present

## 2023-12-06 DIAGNOSIS — E1169 Type 2 diabetes mellitus with other specified complication: Secondary | ICD-10-CM | POA: Diagnosis not present

## 2023-12-06 DIAGNOSIS — T451X5A Adverse effect of antineoplastic and immunosuppressive drugs, initial encounter: Secondary | ICD-10-CM | POA: Diagnosis not present

## 2023-12-06 DIAGNOSIS — Z86711 Personal history of pulmonary embolism: Secondary | ICD-10-CM | POA: Diagnosis not present

## 2023-12-06 DIAGNOSIS — K219 Gastro-esophageal reflux disease without esophagitis: Secondary | ICD-10-CM | POA: Diagnosis not present

## 2023-12-06 DIAGNOSIS — Z125 Encounter for screening for malignant neoplasm of prostate: Secondary | ICD-10-CM | POA: Diagnosis not present

## 2023-12-06 DIAGNOSIS — Z8546 Personal history of malignant neoplasm of prostate: Secondary | ICD-10-CM | POA: Diagnosis not present

## 2023-12-06 DIAGNOSIS — Z85118 Personal history of other malignant neoplasm of bronchus and lung: Secondary | ICD-10-CM | POA: Diagnosis not present

## 2023-12-06 DIAGNOSIS — I110 Hypertensive heart disease with heart failure: Secondary | ICD-10-CM | POA: Diagnosis not present

## 2023-12-06 DIAGNOSIS — J449 Chronic obstructive pulmonary disease, unspecified: Secondary | ICD-10-CM | POA: Diagnosis not present

## 2023-12-06 DIAGNOSIS — E785 Hyperlipidemia, unspecified: Secondary | ICD-10-CM | POA: Diagnosis not present

## 2023-12-06 DIAGNOSIS — G620 Drug-induced polyneuropathy: Secondary | ICD-10-CM | POA: Diagnosis not present

## 2023-12-09 ENCOUNTER — Telehealth: Payer: Self-pay | Admitting: Cardiology

## 2023-12-09 NOTE — Telephone Encounter (Signed)
 Called pt ut no answer or voice mail.

## 2023-12-09 NOTE — Telephone Encounter (Signed)
 Pts wife Ricka calling to make office aware he has been having some chest pain last week. Pt saw his pcp and was prescribed nitroglycerin.     Pt c/o of Chest Pain: STAT if active CP, including tightness, pressure, jaw pain, radiating pain to shoulder/upper arm/back, CP unrelieved by Nitro. Symptoms reported of SOB, nausea, vomiting, sweating.  1. Are you having CP right now? No     2. Are you experiencing any other symptoms (ex. SOB, nausea, vomiting, sweating)? No    3. Is your CP continuous or coming and going? Coming and going    4. Have you taken Nitroglycerin? No    5. How long have you been experiencing CP? Episode happened last week     6. If NO CP at time of call then end call with telling Pt to call back or call 911 if Chest pain returns prior to return call from triage team.

## 2023-12-11 NOTE — Telephone Encounter (Signed)
 Pt had sharp/dull chest pain last week that he saw Dr. Keren for. Dr. Keren prescribed NTG and advised pt to call our office. Pt states that the pain has resolved and he has not used the NTG. Appointment offered 12/20/23 but declined. Appointment made for 12/24/23. Advised to go to the ED for evaluation for return of chest pain not relieved by NTG. Pt verbalized understanding and had no additional questions.

## 2023-12-19 ENCOUNTER — Other Ambulatory Visit: Payer: Self-pay | Admitting: Oncology

## 2023-12-19 ENCOUNTER — Inpatient Hospital Stay

## 2023-12-19 ENCOUNTER — Inpatient Hospital Stay: Admitting: Oncology

## 2023-12-19 ENCOUNTER — Telehealth: Payer: Self-pay | Admitting: Oncology

## 2023-12-19 VITALS — BP 133/75 | HR 76 | Temp 97.9°F | Resp 18 | Ht 70.0 in

## 2023-12-19 DIAGNOSIS — Z7901 Long term (current) use of anticoagulants: Secondary | ICD-10-CM | POA: Insufficient documentation

## 2023-12-19 DIAGNOSIS — C3491 Malignant neoplasm of unspecified part of right bronchus or lung: Secondary | ICD-10-CM

## 2023-12-19 DIAGNOSIS — Z86711 Personal history of pulmonary embolism: Secondary | ICD-10-CM | POA: Insufficient documentation

## 2023-12-19 DIAGNOSIS — Z85118 Personal history of other malignant neoplasm of bronchus and lung: Secondary | ICD-10-CM | POA: Diagnosis present

## 2023-12-19 DIAGNOSIS — Z86718 Personal history of other venous thrombosis and embolism: Secondary | ICD-10-CM | POA: Insufficient documentation

## 2023-12-19 LAB — CMP (CANCER CENTER ONLY)
ALT: 12 U/L (ref 0–44)
AST: 19 U/L (ref 15–41)
Albumin: 3.7 g/dL (ref 3.5–5.0)
Alkaline Phosphatase: 140 U/L — ABNORMAL HIGH (ref 38–126)
Anion gap: 11 (ref 5–15)
BUN: 23 mg/dL (ref 8–23)
CO2: 25 mmol/L (ref 22–32)
Calcium: 9.1 mg/dL (ref 8.9–10.3)
Chloride: 104 mmol/L (ref 98–111)
Creatinine: 1.33 mg/dL — ABNORMAL HIGH (ref 0.61–1.24)
GFR, Estimated: 53 mL/min — ABNORMAL LOW (ref >60)
Glucose, Bld: 96 mg/dL (ref 70–99)
Potassium: 4.1 mmol/L (ref 3.5–5.1)
Sodium: 140 mmol/L (ref 135–145)
Total Bilirubin: 0.4 mg/dL (ref 0.0–1.2)
Total Protein: 6.5 g/dL (ref 6.5–8.1)

## 2023-12-19 LAB — CBC WITH DIFFERENTIAL (CANCER CENTER ONLY)
Abs Immature Granulocytes: 0.02 K/uL (ref 0.00–0.07)
Basophils Absolute: 0 K/uL (ref 0.0–0.1)
Basophils Relative: 1 %
Eosinophils Absolute: 0.2 K/uL (ref 0.0–0.5)
Eosinophils Relative: 4 %
HCT: 35.9 % — ABNORMAL LOW (ref 39.0–52.0)
Hemoglobin: 11.3 g/dL — ABNORMAL LOW (ref 13.0–17.0)
Immature Granulocytes: 0 %
Lymphocytes Relative: 16 %
Lymphs Abs: 1 K/uL (ref 0.7–4.0)
MCH: 29.3 pg (ref 26.0–34.0)
MCHC: 31.5 g/dL (ref 30.0–36.0)
MCV: 93 fL (ref 80.0–100.0)
Monocytes Absolute: 0.5 K/uL (ref 0.1–1.0)
Monocytes Relative: 8 %
Neutro Abs: 4.5 K/uL (ref 1.7–7.7)
Neutrophils Relative %: 71 %
Platelet Count: 185 K/uL (ref 150–400)
RBC: 3.86 MIL/uL — ABNORMAL LOW (ref 4.22–5.81)
RDW: 15.6 % — ABNORMAL HIGH (ref 11.5–15.5)
WBC Count: 6.3 K/uL (ref 4.0–10.5)
nRBC: 0 % (ref 0.0–0.2)

## 2023-12-19 LAB — CEA (ACCESS): CEA (CHCC): 2.38 ng/mL (ref 0.00–5.00)

## 2023-12-19 NOTE — Telephone Encounter (Signed)
 Patient has been scheduled for follow-up visit per 12/19/2023 LOS.  Pt given an appt calendar with date and time.

## 2023-12-19 NOTE — Progress Notes (Signed)
 [facility]  [address] [place],  [place]  [address] [phone]  Clinic Day:  12/19/2023  Referring physician: [name], MD  ASSESSMENT & PLAN:  Assessment: Non-small cell lung cancer Stage IIIA non-small cell lung cancer with good response to concurrent chemoradiation.  He completed 1 year of durvalumab  in March 2021 and tolerated this without significant difficulty.  CT imaging in April revealed continued improvement.  PET scan from July 2021 revealed a large area of treated tumor/radiation fibrosis involving the right lower lobe with no hypermetabolism to suggest residual or recurrent tumor.  No evidence of metastatic disease was observed.  CT scan from September 2022 revealed unchanged post treatment/post radiation appearance of the right lung, with perihilar fibrosis and consolidation. His scan from May 01, 2021 remains stable.  CT from 05/30/2022 remains stable with a band of thickening of the pleural-parenchymal right lower lobe but no evidence of lung cancer recurrence or metastasis. CT chest, abdomen, and pelvis done on 12/03/2022 revealed new superior segment of the right lower lobe collapse with consolidative parenchymal change and collapse of the corresponding airways. The pulmonologist has now done a bronchoscopy in February, 2025 and found mucous plugs but no evidence of malignancy. Cultures were positive for Citrobacter. CT chest done in [facility] on 04/11/2023 that revealed persistent bandlike consolidation in the right mid to lower lung, possibly superior right lower lobe, minimal tree-in-bud density at the right base suggesting infectious or inflammatory bronchiolitis, and multifocal aneurysmal dilatation of the aortic arch, descending thoracic aorta and proximal abdominal aorta. These are stable findings.  Thoracic Aortic Aneurysm This is located in the descending thoracic aorta measuring 5.7 cm. The last measurement from October, 2023 was 5.2. It  increased to 5.7 cm last year and now 6.2 cm on his CT from November, 2024. This is stable on the current scan of 04/11/23  Abdominal Aortic Aneurysm This is a suprarenal location and measures 4.6 cm currently as compared to 4.8 cm one year ago, so it remains stable.  Anemia This is largely anemia of chronic disease.  His hemoglobin is stable at 11.3.    Plan: He finished his treatment for his non-small cell lung cancer in March, 2021, so he is over 4 years off treatment now. A pulmonologist performed a bronchoscopy on 02/20/2023. This revealed that the findings on the previous CT appear to be due to chronic atelectasis from dynamic airway collapse and chronic inflammation of the tracheobronchial tree. Pathology revealed no malignant cells identified and cultures revealed a large growth of Citrobacter so I gave him a course of antibiotics. Patient states that he feels okay, but complains of knee, back, and shoulder pain. He continues to affirm chronic shortness of breath and uses oxygen  24 hours a day. I informed him that he can take breaks from the oxygen  if he can tolerate it. He has a WBC of 6.3, a low hemoglobin of 11.3 down from 11.5, and platelet count of 185,000. His CMP is normal other than an elevated creatinine of 1.33 up from 1.13, an elevated alkaline phophatase of 140 improved from 153, and a low eGFR of 53 down from 60. His CEA is pending. I encouraged him to increase fluid take. He takes 200 mg Celebrex  BID. I explained to him that this medication can harm the kidneys. I instructed him to pause it for a couple weeks and to only take it once daily if he needs it. I will see him back in 6 months with  CBC, CMP, CEA, and CT chest. The patient and his wife understand the plans discussed today and are in agreement with them.  He knows to contact our office if he develops concerns prior to his next appointment.  I provided 20 minutes of face-to-face time during this this encounter and > 50% was  spent counseling as documented under my assessment and plan.   Wanda VEAR Cornish, MD  [facility] - A DEPT OF [facility] [address] [place] [address] Dept: [phone] Dept Fax: [phone]   No orders of the defined types were placed in this encounter.  CHIEF COMPLAINT:  CC: Stage IIIA non-small cell lung cancer  Current Treatment: Observation  HISTORY OF PRESENT ILLNESS:  John Maddox is a 83 y.o. male with stage IIIA (T3 N1 M0) non-small cell lung cancer diagnosed in September 2019.  He had presented with severe bradycardia and left chest discomfort and was found to have a 6.2 cm mass in the right upper lobe with 1 paratracheal node measuring 1-2 cm.  Bronchoscopy was done and the 1st set of cytology and pathology results were negative.  Bronchial brushings and washings revealed malignant cells definitely consistent with non-small cell carcinoma of the lung, favoring adenocarcinoma, in a 2nd specimen.  We recommended concurrent chemoradiation with weekly carboplatin /paclitaxel .  He had many comorbidities including moderate emphysema, a distal descending thoracic aortic aneurysm measuring 4.4 cm, hypertension, congestive heart failure, diabetes mellitus, chronic kidney disease, multiple aortic aneurysms, peripheral artery disease, and history of prostate cancer 11 years ago.  His Cardiolite scan revealed mild global hypokinesis with an ejection fraction of 37%.  He also had pre-existing peripheral neuropathy.  MRI of the head did not reveal any evidence of intracranial metastasis.  He received 7 cycles of chemotherapy, which was completed in December 2019.  Unfortunately, the patient became quite ill and septic later in December 2019.  He was transferred from [facility] to [facility].  He was found to have septic arthritis of his knee and was placed on IV antibiotics at home with daptomycin  and  ceftriaxone  as well as rifampin.  The hemoglobin was 8.0 and dropped as low as 7.1. When the patient went to see Dr. Efrain of Infectious Disease in February 2020, he collapsed prior to getting his labs drawn and had a cardiorespiratory arrest requiring resuscitation. He was found to have acute pulmonary embolism of small to moderate size, but no significant right heart strain.  He had deep venous thrombosis of the right femoral vein, right popliteal vein and right posterior tibial vein.  He was placed on heparin  IV and transitioned to apixaban  5 mg twice daily, which he continues.  He was also found to have aspiration pneumonia during that hospitalization. A CT chest revealed the spiculated mass in the posterior right upper lobe was slightly smaller measuring 3.8 cm in maximum diameter, decreased from 4.0 cm.  There was stable right hilar adenopathy measuring 1.4 cm.  There was a small right pleural effusion.   He started maintenance durvalumab  in March 2020 and tolerated it well.  The patient had a repeat echocardiogram, which showed improvement in his ejection fraction.  CT chest in early June revealed decrease in the size of the right upper lobe mass to 2.4 cm, with decrease in the pleural effusion.  There was a stable thoracic aortic aneurysm measuring 4.7 cm.  When he was seen in June, MRI brain did not  reveal intracranial metastasis or other acute abnormality.  Repeat CT chest in September 2020 revealed improvement in the size of the right upper lobe pulmonary nodule from 2.4 cm to 1.9 cm, with a possible right lower lobe pneumonia.  CT chest in January 2021 revealed slight interval increase in dense, post treatment fibrotic consolidation and volume loss of the perihilar right upper lobe, which is consistent with maturation of radiation fibrosis.  The previously seen clustered nodularity of the right lower lobe was essentially resolved. Foundation One testing of the peripheral blood show no reportable  alterations with companion diagnostic claims.  Other biomarkers with potential clinical significance, included DNMT3A and TET2.  He completed a total of 1 year of durvalumab  therapy on March 10th.  CT chest in April revealed post treatment changes about the right hilum measuring 6.9 x 2.5 cm, previously 7.4 x 3.4 cm.  There were nodular changes in the left lung base developing along the margin of the aorta measuring 1.7 x 1.1 cm, more conspicuous than on the previous exam. The upper abdominal aortic aneurysm with mural thrombus is similar in appearance measuring 4.6 cm.  PET imaging from July 2021 revealed a large are of treated tumor/radiation fibrosis involving the right lower lobe.  There was no hypermetabolism to suggest residual or recurrent tumor, no findings for mediastinal/hilar adenopathy or pulmonary metastatic disease.  There was no evidence of abdominal/pelvic metastatic disease or osseous metastatic disease.  Stable, severe vascular disease was seen.  The upper abdominal aorta measures 4.4 cm.  CT from January of 2022 revealed stable right infrahilar soft tissue at the site of previous treatment, and decreased size of nodular focus at the left lung base now measuring 13 x 10 mm as compared to 17 x 10 mm, likely nodular scarring. Tortuous and aneurysmal appearance of the thoracoabdominal aorta with similar appearance.  CT chest, abdomen and pelvis from September of 2022 revealed unchanged post treatment/post radiation appearance of the right lung, with perihilar fibrosis and consolidation with no evidence of recurrent or metastatic disease. Redemonstrated aneurysmal aortic arch and descending thoracic aorta, maximum caliber of the distal arch approximately 4.6 x 4.2 cm and of the distal descending thoracic aorta approximately 5.2 x 5.2 cm.  INTERVAL HISTORY:  Johntay is here today for repeat clinical assessment for stage IIIA non-small cell lung cancer. He finished his treatment in March, 2021, so he  is over 4 years off treatment now. A pulmonologist performed a bronchoscopy on 02/20/2023. This revealed that the findings on the previous CT appear to be due to chronic atelectasis from dynamic airway collapse and chronic inflammation of the tracheobronchial tree. Pathology revealed no malignant cells identified and cultures revealed a large growth of Citrobacter so I gave him a course of antibiotics. Patient states that he feels okay, but complains of knee, back, and shoulder pain. He continues to affirm chronic shortness of breath and uses oxygen  24 hours a day. I informed him that he can take breaks from the oxygen  if he can tolerate it. He has a WBC of 6.3, a low hemoglobin of 11.3 down from 11.5, and platelet count of 185,000. His CMP is normal other than an elevated creatinine of 1.33 up from 1.13, an elevated alkaline phophatase of 140 improved from 153, and a low eGFR of 53 down from 60. His CEA is pending. I encouraged him to increase fluid take. He takes 200 mg Celebrex  BID. I explained to him that this medication can harm the kidneys. I instructed him  to pause it for a couple weeks and to only take it once daily if he needs it. I will see him back in 6 months with CBC, CMP, CEA, and CT chest.  He denies fever, chills, night sweats, or other signs of infection. He denies gastrointestinal issues. He  denies pain. His appetite is okay and he did not have a weight taken today. He is accompanied by his wife.  REVIEW OF SYSTEMS:  Review of Systems  Constitutional:  Positive for fatigue. Negative for appetite change, chills, diaphoresis, fever and unexpected weight change.  HENT:  Negative.  Negative for hearing loss, lump/mass, mouth sores, nosebleeds, sore throat, tinnitus, trouble swallowing and voice change.   Eyes: Negative.  Negative for eye problems and icterus.  Respiratory:  Positive for cough (mild) and shortness of breath (With exertion). Negative for chest tightness, hemoptysis and wheezing.    Cardiovascular: Negative.  Negative for chest pain, leg swelling and palpitations.  Gastrointestinal: Negative.  Negative for abdominal distention, abdominal pain, blood in stool, constipation, diarrhea, nausea, rectal pain and vomiting.  Genitourinary: Negative.  Negative for bladder incontinence, difficulty urinating, dyspareunia, dysuria, frequency, nocturia, pelvic pain and penile discharge.   Musculoskeletal:  Positive for arthralgias (Chronic, stable due to degenerative disease), back pain and gait problem (Due to degenerative disease). Negative for flank pain, myalgias, neck pain and neck stiffness.       Pain in back, hip, and shoulders  Skin: Negative.  Negative for itching, rash and wound.  Neurological:  Positive for extremity weakness (increasing) and gait problem (Due to degenerative disease). Negative for dizziness, headaches, light-headedness, numbness, seizures and speech difficulty.  Hematological:  Negative for adenopathy. Does not bruise/bleed easily.  Psychiatric/Behavioral: Negative.  Negative for confusion, decreased concentration, depression, sleep disturbance and suicidal ideas. The patient is not nervous/anxious.    VITALS:  Blood pressure 133/75, pulse 76, temperature 97.9 F (36.6 C), temperature source Oral, resp. rate 18, height 5' 10 (1.778 m), SpO2 99%.  Wt Readings from Last 3 Encounters:  07/08/23 231 lb 3.2 oz (104.9 kg)  01/29/23 236 lb 11.2 oz (107.4 kg)  12/25/22 235 lb (106.6 kg)    Body mass index is 33.17 kg/m.  Performance status (ECOG): 2 - Symptomatic, <50% confined to bed  PHYSICAL EXAM:  Physical Exam Vitals and nursing note reviewed. Exam conducted with a chaperone present.  Constitutional:      General: He is not in acute distress.    Appearance: Normal appearance. He is normal weight. He is not ill-appearing, toxic-appearing or diaphoretic.  HENT:     Head: Normocephalic and atraumatic.     Right Ear: Tympanic membrane, ear canal and  external ear normal. There is no impacted cerumen.     Left Ear: Tympanic membrane, ear canal and external ear normal. There is no impacted cerumen.     Nose: Nose normal. No congestion or rhinorrhea.     Mouth/Throat:     Mouth: Mucous membranes are moist.     Pharynx: Oropharynx is clear. No oropharyngeal exudate or posterior oropharyngeal erythema.  Eyes:     General: No scleral icterus.       Right eye: No discharge.        Left eye: No discharge.     Extraocular Movements: Extraocular movements intact.     Conjunctiva/sclera: Conjunctivae normal.     Pupils: Pupils are equal, round, and reactive to light.  Neck:     Vascular: No carotid bruit.  Cardiovascular:  Rate and Rhythm: Normal rate and regular rhythm.     Pulses: Normal pulses.     Heart sounds: Normal heart sounds. No murmur heard.    No friction rub. No gallop.  Pulmonary:     Effort: Pulmonary effort is normal. No respiratory distress.     Breath sounds: No stridor. Examination of the right-upper field reveals wheezing and rhonchi. Examination of the left-upper field reveals wheezing and rhonchi. Wheezing and rhonchi present. No rales. Decreased breath sounds: mild.    Comments: Mild expiratory wheezes in both upper lobes On oxygen  at 2L Chest:     Chest wall: No tenderness.  Abdominal:     General: Bowel sounds are normal. There is no distension.     Palpations: Abdomen is soft. There is no hepatomegaly, splenomegaly or mass.     Tenderness: There is no abdominal tenderness. There is no right CVA tenderness, left CVA tenderness, guarding or rebound.     Hernia: No hernia is present.  Musculoskeletal:        General: No swelling, tenderness, deformity or signs of injury. Normal range of motion.     Cervical back: Normal range of motion and neck supple. No rigidity or tenderness.     Right lower leg: 2+ Edema present.     Left lower leg: 2+ Edema present.  Lymphadenopathy:     Cervical: No cervical adenopathy.      Upper Body:     Right upper body: No supraclavicular or axillary adenopathy.     Left upper body: No supraclavicular or axillary adenopathy.     Lower Body: No right inguinal adenopathy. No left inguinal adenopathy.  Skin:    General: Skin is warm and dry.     Coloration: Skin is not jaundiced or pale.     Findings: No bruising, erythema, lesion or rash.  Neurological:     General: No focal deficit present.     Mental Status: He is alert and oriented to person, place, and time. Mental status is at baseline.     Cranial Nerves: No cranial nerve deficit.     Sensory: No sensory deficit.     Motor: No weakness.     Coordination: Coordination normal.     Gait: Gait normal.     Deep Tendon Reflexes: Reflexes normal.  Psychiatric:        Mood and Affect: Mood normal.        Behavior: Behavior normal.        Thought Content: Thought content normal.        Judgment: Judgment normal.    LABS:      Latest Ref Rng & Units 12/19/2023    2:15 PM 06/13/2023    1:40 PM 03/05/2023   12:15 PM  CBC  WBC 4.0 - 10.5 K/uL 6.3  5.0  5.7   Hemoglobin 13.0 - 17.0 g/dL 88.6  88.4  88.2   Hematocrit 39.0 - 52.0 % 35.9  36.2  36.5   Platelets 150 - 400 K/uL 185  162  186       Latest Ref Rng & Units 12/19/2023    2:15 PM 06/13/2023    1:40 PM 03/05/2023   12:15 PM  CMP  Glucose 70 - 99 mg/dL 96  91  896   BUN 8 - 23 mg/dL 23  17  19    Creatinine 0.61 - 1.24 mg/dL 8.66  8.86  8.70   Sodium 135 - 145 mmol/L 140  139  140  Potassium 3.5 - 5.1 mmol/L 4.1  4.4  4.3   Chloride 98 - 111 mmol/L 104  103  103   CO2 22 - 32 mmol/L 25  24  26    Calcium 8.9 - 10.3 mg/dL 9.1  9.2  9.4   Total Protein 6.5 - 8.1 g/dL 6.5  6.3  6.5   Total Bilirubin 0.0 - 1.2 mg/dL 0.4  0.4  0.4   Alkaline Phos 38 - 126 U/L 140  153  161   AST 15 - 41 U/L 19  22  28    ALT 0 - 44 U/L 12  18  15     Lab Results  Component Value Date   CEA1 2.1 02/01/2021   CEA 2.38 12/19/2023   /  CEA  Date Value Ref Range Status   02/01/2021 2.1 0.0 - 4.7 ng/mL Final    Comment:    (NOTE)                             Nonsmokers          <3.9                             Smokers             <5.6 [employer] Electrochemiluminescence Immunoassay (ECLIA) Values obtained with different assay methods or kits cannot be used interchangeably.  Results cannot be interpreted as absolute evidence of the presence or absence of malignant disease. Performed At: [facility] [address] [place], [place] [id number] Jennette Shorter MD Ey:[phone]    CEA ([facility])  Date Value Ref Range Status  12/19/2023 2.38 0.00 - 5.00 ng/mL Final    Comment:    (NOTE) This test was performed using [employer] Coulter's paramagnetic chemiluminescent immunoassay. Values obtained from different assay methods cannot be used interchangeably. Please note that up to 8% of patients who smoke may see values 5.1-10.0 ng/ml and 1% of patients who smoke may see CEA levels >10.0 ng/ml. Performed at [facility], [address] [place], [place] [address]    STUDIES:  EXAM: 04/11/2023 CT CHEST WO CONTRAST IMPRESSION: 1. persistent bandlike consolidation in the right mid to lower lung, possibly superior right lower lobe, superimposed on underlying chronic post therapeutic change. There is some improvement in aeration since the prior exam from December with better visualized air bronchograms. Correlation with bronchoscopy previously suggested  2. Minimal tree-in-bud density at the right base suggesting infectious or inflammatory bronchiolitis  3. Multifocal aneurysmal dilatation of the aortic arch, descending thoracic aorta and proximal abdominal aorta. Continued follow-up with vascular specialist is recommended.   HISTORY:   Past Medical History:  Diagnosis Date   AAA (abdominal aortic aneurysm) without rupture 08/03/2015   Abnormal CT of the chest 12/24/2022   AKI (acute kidney injury) 01/08/2018   Last Assessment  & Plan:  Improved Nephrology consulted Likely 2/2 ATN and CIN in the setting of contrast at the outside hospital on 12/21, NSAIDs at the outside hospital, intra-operative hypotension, and entresto  use. Avoid nephrotoxic medications(entresto /diuretic) Renally dose medications Strict I&O Monitor creatinine(1.68 today)     Anemia 01/17/2018   Last Assessment & Plan:  Due to chemotherapy and anemia of chronic disease Iron panel shows anemia of chronic disease Follow up with PCP and oncologist   Arrhythmia    Asthma 09/23/2017   Atherosclerosis of native artery of both lower extremities  08/03/2015   Bilateral carotid artery stenosis 08/03/2015   BPH with obstruction/lower urinary tract symptoms 02/03/2021   Added automatically from request for surgery [id number]     Cardiomyopathy (HCC) 11/18/2017   Chronic anticoagulation 06/05/2018   Chronic combined systolic and diastolic heart failure (HCC) 10/03/2017   Congestive heart failure (HCC) 09/23/2017   COPD (chronic obstructive pulmonary disease) (HCC) 09/23/2017   Diabetes mellitus (HCC) 09/23/2017   Diarrhea 01/14/2018   Last Assessment & Plan:  C diff - negative  GIP - negative Resolved   Diastolic dysfunction 09/23/2017   Disorder of skin or subcutaneous tissue 07/02/2023   Drug-induced mental disorder (HCC) 07/02/2023   Duodenal nodule 11/10/2014   Edema 07/02/2023   Elevated prostate specific antigen (PSA) 07/02/2023   Esophageal reflux 09/23/2017   Essential hypertension 11/10/2014   Last Assessment & Plan:  Controlled off of medication Follow up with PCP as an outpatient   Fatigue 09/23/2017   Gout 09/23/2017   Hematuria 01/14/2018   Last Assessment & Plan:  Need UA as outpatient.   Hereditary and idiopathic peripheral neuropathy 07/02/2023   History of prostate cancer 09/23/2017   Hyperlipidemia 09/23/2017   Hypertension 09/23/2017   Hypertensive heart disease 09/23/2017   Hypertensive heart disease with heart failure (HCC)  06/06/2018   Hypokalemia 01/14/2018   Last Assessment & Plan:  Replace and follow up with PCP   Hypomagnesemia 01/14/2018   Last Assessment & Plan:  Replace and monitor and follow up with PCP   Hypophosphatemia 01/15/2018   Last Assessment & Plan:  Resolved   Infected prosthetic knee joint, initial encounter 03/06/2018   Insulin dependent type 2 diabetes mellitus (HCC) 11/10/2014   Last Assessment & Plan:  Glucose 109-140 Continue ISS Monitor glucose   Left ventricular dysfunction 10/03/2017   Low back pain 07/02/2023   Lung mass 10/04/2017   Non-small cell lung cancer (HCC) 10/03/2017   Obesity 07/02/2023   Postobstructive pneumonia 12/25/2022   Pulmonary emboli (HCC) 03/06/2018   Pulmonary embolism (HCC)    PVC's (premature ventricular contractions) 09/23/2017   PVD (peripheral vascular disease) 11/10/2014   Septic arthritis of knee, left (HCC) 01/04/2018   Added automatically from request for surgery [id number]  Last Assessment & Plan:  Prosthetic knee infection S/p wash out Culture negative  Continue Dapto + Ceftriaxone  + Rifampin for 4 weeks(currently day 7) ID was notified today prior to discharge He will need CBC, CMP, ESR, CRP weekly faxed to [phone]([facility]AT with ID) ID outpatient appointment is on 02/17/2017  Antibiotics administered by port by ho   Thrombocytopenia 01/15/2018   Last Assessment & Plan:  Decreased to 78.  Due to chemotherapy Peripheral smear negative for schistocytes LDH 211(within normal limits), haptoglobin 329(elevated), Retic 1.7 B12=714, Folate=20, copper pending   Tobacco user 07/02/2023    Past Surgical History:  Procedure Laterality Date   ABDOMINAL AORTIC ANEURYSM REPAIR     AORTA - ILIAC ARTERY BYPASS GRAFT     BACK SURGERY     CATARACT EXTRACTION, BILATERAL     CHOLECYSTECTOMY     HEMICOLECTOMY     HERNIA REPAIR     KNEE SURGERY Left    replaced all the hardwear   PROSTATE SURGERY     REPLACEMENT TOTAL KNEE     STENT PLACEMENT VASCULAR ([facility] HX)      TONSILLECTOMY      Family History  Problem Relation Age of Onset   Arthritis Mother    Lung cancer Mother    Hypertension Mother  Arthritis Father    Heart attack Father    Prostate cancer Father    Hypertension Father    Hyperlipidemia Father    Heart disease Father    Alcohol abuse Father    Arthritis Brother    Lung cancer Brother     Social History:  reports that he quit smoking about 11 years ago. His smoking use included cigarettes. He started smoking about 51 years ago. He has a 100 pack-year smoking history. He has been exposed to tobacco smoke. He has never used smokeless tobacco. He reports that he does not currently use alcohol. He reports that he does not currently use drugs.The patient is accompanied by his wife today.  Allergies:  Allergies  Allergen Reactions   Insulin Glargine     Stomach pain, GI Intolerance, urinary frequency   Quinolones Other (See Comments)    History of aortic aneurysm status post repair.  Use with caution    Current Medications: Current Outpatient Medications  Medication Sig Dispense Refill   nitroGLYCERIN (NITROSTAT) 0.4 MG SL tablet SMARTSIG:1 Tablet(s) Sublingual     allopurinol  (ZYLOPRIM ) 300 MG tablet Take 300 mg by mouth daily.     apixaban  (ELIQUIS ) 5 MG TABS tablet Take 1 tablet (5 mg total) by mouth 2 (two) times daily. 180 tablet 3   carvedilol  (COREG ) 3.125 MG tablet Take 1 tablet (3.125 mg total) by mouth 2 (two) times daily. 180 tablet 1   celecoxib  (CELEBREX ) 200 MG capsule Take by mouth 2 (two) times daily.     ENTRESTO  97-103 MG TAKE 1 TABLET TWICE A DAY 180 tablet 3   fluticasone-salmeterol (ADVAIR HFA) 115-21 MCG/ACT inhaler Inhale 2 puffs into the lungs 2 (two) times daily.      gabapentin (NEURONTIN) 100 MG capsule Take 100 mg by mouth 2 (two) times daily.     metFORMIN (GLUCOPHAGE) 500 MG tablet Take 500 mg by mouth daily with breakfast. 2 tablets daily     omeprazole (PRILOSEC) 20 MG capsule Take 20 mg by  mouth daily.     oxyCODONE  (OXY IR/ROXICODONE ) 5 MG immediate release tablet Take 1 tablet (5 mg total) by mouth every 6 (six) hours as needed for severe pain (pain score 7-10). 120 tablet 0   OXYGEN  Inhale into the lungs continuous. 2 liters     PROAIR  HFA 108 (90 Base) MCG/ACT inhaler Inhale 1 puff into the lungs every 6 (six) hours as needed.      simvastatin  (ZOCOR ) 40 MG tablet Take 1 tablet (40 mg total) by mouth daily. 90 tablet 3   SPIRIVA  RESPIMAT 2.5 MCG/ACT AERS Inhale 2 puffs into the lungs daily.     tamsulosin (FLOMAX) 0.4 MG CAPS capsule Take 0.4 mg by mouth daily.     torsemide  (DEMADEX ) 20 MG tablet TAKE 1 TABLET DAILY, TAKE 1 EXTRA TABLET AT NOON ON MONDAY, WEDNESDAY, AND FRIDAY (Patient taking differently: Take 20 mg by mouth daily.) 130 tablet 3   No current facility-administered medications for this visit.   I,Ravyn Nikkel H Valta Dillon,acting as a scribe for Wanda VEAR Cornish, MD.,have documented all relevant documentation on the behalf of Wanda VEAR Cornish, MD,as directed by  Wanda VEAR Cornish, MD while in the presence of Wanda VEAR Cornish, MD.  I have reviewed this report as typed by the medical scribe, and it is complete and accurate.

## 2023-12-23 ENCOUNTER — Other Ambulatory Visit: Payer: Self-pay

## 2023-12-23 DIAGNOSIS — G8929 Other chronic pain: Secondary | ICD-10-CM

## 2023-12-23 MED ORDER — OXYCODONE HCL 5 MG PO TABS: 5.0000 mg | ORAL_TABLET | Freq: Four times a day (QID) | ORAL | 0 refills | Status: DC | PRN

## 2023-12-24 ENCOUNTER — Ambulatory Visit

## 2023-12-27 ENCOUNTER — Encounter: Payer: Self-pay | Admitting: Hematology and Oncology

## 2023-12-27 ENCOUNTER — Ambulatory Visit

## 2023-12-27 ENCOUNTER — Encounter: Payer: Self-pay | Admitting: Oncology

## 2024-01-12 ENCOUNTER — Encounter: Payer: Self-pay | Admitting: Hematology and Oncology

## 2024-01-13 ENCOUNTER — Ambulatory Visit

## 2024-01-15 ENCOUNTER — Ambulatory Visit

## 2024-01-15 ENCOUNTER — Encounter: Payer: Self-pay | Admitting: Hematology and Oncology

## 2024-01-15 VITALS — BP 126/70 | HR 84 | Ht 70.0 in

## 2024-01-15 DIAGNOSIS — E782 Mixed hyperlipidemia: Secondary | ICD-10-CM | POA: Diagnosis not present

## 2024-01-15 DIAGNOSIS — I5032 Chronic diastolic (congestive) heart failure: Secondary | ICD-10-CM | POA: Diagnosis not present

## 2024-01-15 DIAGNOSIS — I10 Essential (primary) hypertension: Secondary | ICD-10-CM | POA: Insufficient documentation

## 2024-01-15 NOTE — Progress Notes (Signed)
 "  Cardiology Consultation:    Date:  01/15/2024   ID:  John Maddox. John Maddox, John Maddox 01/21/1940, MRN [id number]  PCP:  [name], MD  Cardiologist:  [name] Avagrace Botelho, MD   Referring MD: [name], MD   No chief complaint on file.    ASSESSMENT AND PLAN:   Mr John Maddox 83 year old male with history of chronic CHF with recovered LVEF [echocardiogram September 2023 EF 55 to 60%; was reduced 45 to 50% in September 2019 on echocardiogram at [facility] in ischemic workup with Lexiscan nuclear imaging at the time showed no ischemia], hypertension, massive pulmonary embolism remains on chronic anticoagulation, thoracic aortic aneurysm and AAA previously underwent EVAR 2006 follows up with vascular surgery at [facility] and he decided against pursuing vascular surgery, hyperlipidemia, COPD on 2 L nasal cannula O2 flow, lung cancer s/p chemoradiation therapy 2019 and follows up with Dr. Cornelius.     Here for follow-up.  Problem List Items Addressed This Visit       Cardiovascular and Mediastinum   Hypertension   Well-controlled on current regimen with carvedilol , Entresto . Considering his thoracic and abdominal aortic aneurysm history, target blood pressure strictly below 130/80 mmHg.       Congestive heart failure [facility]) - Primary   recovered LVEF [echocardiogram September 2023 EF 55 to 60%; was reduced 45 to 50% in September 2019 on echocardiogram at [facility] in ischemic workup with Lexiscan nuclear imaging at the time showed no ischemia]   Remains mildly hypervolemic. Unable to check weights. Recommended keep salt intake minimal less than 2 g/day. Continue torsemide  currently he is taking torsemide  20 mg once daily in the morning and tends to miss out on the afternoon doses. Advised him to take torsemide  20 mg every other day in the afternoon consistently.  Continue guideline directed medical therapy for cardiomyopathy Currently on Entresto  97-103 mg twice  daily Carvedilol  3.125 mg twice daily Considering limited mobility not a candidate for SGLT2 inhibitors. Given recovered LVEF and compensated state we will hold off on escalating spironolactone.          Other   Hyperlipidemia   Continue simvastatin  40 mg once daily. Lipid panel from 12/06/2023 well-controlled.       Relevant Orders   EKG 12-Lead (Completed)    Return to clinic tentatively in 6 months.  History of Present Illness:    John Maddox. John Maddox is a 83 y.o. male who is being seen today for a follow-up visit. PCP is [name], MD. Last visit with me in the office was 07/08/2023.  Here for the visit accompanied by his wife.  Uses a wheelchair to ambulate.  Limited ambulation using a walker in his house for a few feet.  Gets extremely tired ambulating with a walker.  Has history of chronic CHF with recovered LVEF [echocardiogram September 2023 EF 55 to 60%; was reduced 45 to 50% in September 2019 on echocardiogram at [facility] in ischemic workup with Lexiscan nuclear imaging at the time showed no ischemia], hypertension, massive pulmonary embolism remains on chronic anticoagulation, thoracic aortic aneurysm and AAA previously underwent EVAR 2006 follows up with vascular surgery at [facility] and he decided against pursuing vascular surgery, hyperlipidemia, COPD on 2 L nasal cannula O2 flow, lung cancer s/p chemoradiation therapy 2019 and follows up with Dr. Cornelius.    Unable to weigh due to difficulty getting out of the wheelchair. Mentions overall things have been fairly steady with no new changes since his last visit with  us .  Reports getting easily tired through the end of the day. Does get out of breath with any effort. Denies any chest pain or shortness of breath at rest. Mentions bilateral lower extremity edema has been steady and no significant change.  Blood work from 12/19/2023 noted sodium 140, potassium 4.1, BUN 23 and creatinine 1.33 with eGFR  53. Normal transaminases. Alkaline phosphatase mild elevated 140. CBC with hemoglobin 11.3 and hematocrit 35.9, WBC 6.3 and platelets 185.   Lipid panel from 12/06/2023 notes total cholesterol 128, triglycerides 110, HDL 38 and LDL 70.   EKG in clinic today shows sinus rhythm heart rate 84/min, PR interval 208 ms, QRS duration 84 ms, QTc 446 ms, no ischemic changes.   Past Medical History:  Diagnosis Date   AAA (abdominal aortic aneurysm) without rupture 08/03/2015   Abnormal CT of the chest 12/24/2022   AKI (acute kidney injury) 01/08/2018   Last Assessment & Plan:  Improved Nephrology consulted Likely 2/2 ATN and CIN in the setting of contrast at the outside hospital on 12/21, NSAIDs at the outside hospital, intra-operative hypotension, and entresto  use. Avoid nephrotoxic medications(entresto /diuretic) Renally dose medications Strict I&O Monitor creatinine(1.68 today)     Anemia 01/17/2018   Last Assessment & Plan:  Due to chemotherapy and anemia of chronic disease Iron panel shows anemia of chronic disease Follow up with PCP and oncologist   Arrhythmia    Asthma 09/23/2017   Atherosclerosis of native artery of both lower extremities 08/03/2015   Bilateral carotid artery stenosis 08/03/2015   BPH with obstruction/lower urinary tract symptoms 02/03/2021   Added automatically from request for surgery [id number]     Cardiomyopathy (HCC) 11/18/2017   Chronic anticoagulation 06/05/2018   Chronic combined systolic and diastolic heart failure (HCC) 10/03/2017   Congestive heart failure (HCC) 09/23/2017   COPD (chronic obstructive pulmonary disease) (HCC) 09/23/2017   Diabetes mellitus (HCC) 09/23/2017   Diarrhea 01/14/2018   Last Assessment & Plan:  C diff - negative  GIP - negative Resolved   Diastolic dysfunction 09/23/2017   Disorder of skin or subcutaneous tissue 07/02/2023   Drug-induced mental disorder (HCC) 07/02/2023   Duodenal nodule 11/10/2014   Edema 07/02/2023   Elevated  prostate specific antigen (PSA) 07/02/2023   Esophageal reflux 09/23/2017   Essential hypertension 11/10/2014   Last Assessment & Plan:  Controlled off of medication Follow up with PCP as an outpatient   Fatigue 09/23/2017   Gout 09/23/2017   Hematuria 01/14/2018   Last Assessment & Plan:  Need UA as outpatient.   Hereditary and idiopathic peripheral neuropathy 07/02/2023   History of prostate cancer 09/23/2017   Hyperlipidemia 09/23/2017   Hypertension 09/23/2017   Hypertensive heart disease 09/23/2017   Hypertensive heart disease with heart failure (HCC) 06/06/2018   Hypokalemia 01/14/2018   Last Assessment & Plan:  Replace and follow up with PCP   Hypomagnesemia 01/14/2018   Last Assessment & Plan:  Replace and monitor and follow up with PCP   Hypophosphatemia 01/15/2018   Last Assessment & Plan:  Resolved   Infected prosthetic knee joint, initial encounter 03/06/2018   Insulin dependent type 2 diabetes mellitus (HCC) 11/10/2014   Last Assessment & Plan:  Glucose 109-140 Continue ISS Monitor glucose   Left ventricular dysfunction 10/03/2017   Low back pain 07/02/2023   Lung mass 10/04/2017   Non-small cell lung cancer (HCC) 10/03/2017   Obesity 07/02/2023   Postobstructive pneumonia 12/25/2022   Pulmonary emboli (HCC) 03/06/2018   Pulmonary embolism (HCC)  PVC's (premature ventricular contractions) 09/23/2017   PVD (peripheral vascular disease) 11/10/2014   Septic arthritis of knee, left (HCC) 01/04/2018   Added automatically from request for surgery [id number]  Last Assessment & Plan:  Prosthetic knee infection S/p wash out Culture negative  Continue Dapto + Ceftriaxone  + Rifampin for 4 weeks(currently day 7) ID was notified today prior to discharge He will need CBC, CMP, ESR, CRP weekly faxed to [phone](OPAT with ID) ID outpatient appointment is on 02/17/2017  Antibiotics administered by port by ho   Thrombocytopenia 01/15/2018   Last Assessment & Plan:  Decreased to 78.   Due to chemotherapy Peripheral smear negative for schistocytes LDH 211(within normal limits), haptoglobin 329(elevated), Retic 1.7 B12=714, Folate=20, copper pending   Tobacco user 07/02/2023    Past Surgical History:  Procedure Laterality Date   ABDOMINAL AORTIC ANEURYSM REPAIR     AORTA - ILIAC ARTERY BYPASS GRAFT     BACK SURGERY     CATARACT EXTRACTION, BILATERAL     CHOLECYSTECTOMY     HEMICOLECTOMY     HERNIA REPAIR     KNEE SURGERY Left    replaced all the hardwear   PROSTATE SURGERY     REPLACEMENT TOTAL KNEE     STENT PLACEMENT VASCULAR ([facility] HX)     TONSILLECTOMY      Current Medications: Active Medications[1]   Allergies:   Insulin glargine and Quinolones   Social History   Socioeconomic History   Marital status: Married    Spouse name: Not on file   Number of children: Not on file   Years of education: Not on file   Highest education level: Not on file  Occupational History   Not on file  Tobacco Use   Smoking status: Former    Current packs/day: 0.00    Average packs/day: 2.5 packs/day for 40.0 years (100.0 ttl pk-yrs)    Types: Cigarettes    Start date: 42    Quit date: 2014    Years since quitting: 12.0    Passive exposure: Past   Smokeless tobacco: Never  Vaping Use   Vaping status: Never Used  Substance and Sexual Activity   Alcohol use: Not Currently   Drug use: Not Currently   Sexual activity: Not on file  Other Topics Concern   Not on file  Social History Narrative   Not on file   Social Drivers of Health   Tobacco Use: Medium Risk (01/15/2024)   Patient History    Smoking Tobacco Use: Former    Smokeless Tobacco Use: Never    Passive Exposure: Past  Physicist, Medical Strain: Not on file  Food Insecurity: Low Risk (01/14/2023)   Received from [facility]   Epic    Within the past 12 months, you worried that your food would run out before you got money to buy more: Never true    Within the past 12 months, the food you bought  just didn't last and you didn't have money to get more. : Never true  Transportation Needs: No Transportation Needs (01/14/2023)   Received from [facility]    In the past 12 months, has lack of reliable transportation kept you from medical appointments, meetings, work or from getting things needed for daily living? : No  Physical Activity: Not on file  Stress: Not on file  Social Connections: Not on file  Depression (EYV7-0): Not on file  Alcohol Screen: Not on file  Housing: Low Risk (01/14/2023)  Received from [facility]   Epic    What is your living situation today?: I have a steady place to live    Think about the place you live. Do you have problems with any of the following? Choose all that apply:: Not on file  Utilities: Low Risk (01/14/2023)   Received from [facility]   Utilities    In the past 12 months has the electric, gas, oil, or water company threatened to shut off services in your home? : No  Health Literacy: Not on file     Family History: The patient's family history includes Alcohol abuse in his father; Arthritis in his brother, father, and mother; Heart attack in his father; Heart disease in his father; Hyperlipidemia in his father; Hypertension in his father and mother; Lung cancer in his brother and mother; Prostate cancer in his father. ROS:   Please see the history of present illness.    All 14 point review of systems negative except as described per history of present illness.  EKGs/Labs/Other Studies Reviewed:    The following studies were reviewed today:   EKG:  EKG Interpretation Date/Time:  Wednesday January 15 2024 15:32:45 EST Ventricular Rate:  84 PR Interval:  208 QRS Duration:  84 QT Interval:  378 QTC Calculation: 446 R Axis:   -27  Text Interpretation: Normal sinus rhythm Low voltage QRS Cannot rule out Anterior infarct (cited on or before 15-Nov-2022) When compared with ECG of 15-Nov-2022 14:03, Criteria for  Inferior infarct are no longer Present Questionable change in initial forces of Septal leads Confirmed by Liborio Hai reddy [phone]) on 01/15/2024 3:41:56 PM    Recent Labs: 12/19/2023: ALT 12; BUN 23; Creatinine 1.33; Hemoglobin 11.3; Platelet Count 185; Potassium 4.1; Sodium 140  Recent Lipid Panel No results found for: CHOL, TRIG, HDL, CHOLHDL, VLDL, LDLCALC, LDLDIRECT  Physical Exam:    VS:  BP 126/70   Pulse 84   Ht 5' 10 (1.778 m)   SpO2 98%   BMI 33.17 kg/m     Wt Readings from Last 3 Encounters:  07/08/23 231 lb 3.2 oz (104.9 kg)  01/29/23 236 lb 11.2 oz (107.4 kg)  12/25/22 235 lb (106.6 kg)     GENERAL:  Well nourished, well developed in no acute distress NECK: No JVD; No carotid bruits CARDIAC: RRR, S1 and S2 present, no murmurs, no rubs, no gallops CHEST:  Clear to auscultation without rales, wheezing or rhonchi  Extremities: 1+ bilateral pitting pedal edema extending up to the knees. Pulses bilaterally symmetric with radial 2+ and dorsalis pedis 2+ NEUROLOGIC:  Alert and oriented x 3  Medication Adjustments/Labs and Tests Ordered: Current medicines are reviewed at length with the patient today.  Concerns regarding medicines are outlined above.  Orders Placed This Encounter  Procedures   EKG 12-Lead   No orders of the defined types were placed in this encounter.   Signed, Hai reddy Aleli Navedo, MD, MPH, [facility]. 01/15/2024 4:15 PM    [facility]    [1]  Current Meds  Medication Sig   allopurinol  (ZYLOPRIM ) 300 MG tablet Take 300 mg by mouth daily.   apixaban  (ELIQUIS ) 5 MG TABS tablet Take 1 tablet (5 mg total) by mouth 2 (two) times daily.   carvedilol  (COREG ) 3.125 MG tablet Take 1 tablet (3.125 mg total) by mouth 2 (two) times daily.   celecoxib  (CELEBREX ) 200 MG capsule Take by mouth 2 (two) times daily.   ENTRESTO  97-103 MG TAKE 1  TABLET TWICE A DAY   fluticasone-salmeterol (ADVAIR HFA) 115-21 MCG/ACT  inhaler Inhale 2 puffs into the lungs 2 (two) times daily.    gabapentin (NEURONTIN) 100 MG capsule Take 100 mg by mouth 2 (two) times daily.   metFORMIN (GLUCOPHAGE) 500 MG tablet Take 500 mg by mouth daily with breakfast. 2 tablets daily   nitroGLYCERIN (NITROSTAT) 0.4 MG SL tablet SMARTSIG:1 Tablet(s) Sublingual   omeprazole (PRILOSEC) 20 MG capsule Take 20 mg by mouth daily.   oxyCODONE  (OXY IR/ROXICODONE ) 5 MG immediate release tablet Take 1 tablet (5 mg total) by mouth every 6 (six) hours as needed for severe pain (pain score 7-10).   OXYGEN  Inhale into the lungs continuous. 2 liters   PROAIR  HFA 108 (90 Base) MCG/ACT inhaler Inhale 1 puff into the lungs every 6 (six) hours as needed.    simvastatin  (ZOCOR ) 40 MG tablet Take 1 tablet (40 mg total) by mouth daily.   SPIRIVA  RESPIMAT 2.5 MCG/ACT AERS Inhale 2 puffs into the lungs daily.   tamsulosin (FLOMAX) 0.4 MG CAPS capsule Take 0.4 mg by mouth daily.   torsemide  (DEMADEX ) 20 MG tablet TAKE 1 TABLET DAILY, TAKE 1 EXTRA TABLET AT NOON ON MONDAY, WEDNESDAY, AND FRIDAY   "

## 2024-01-15 NOTE — Assessment & Plan Note (Signed)
 Continue simvastatin  40 mg once daily. Lipid panel from 12/06/2023 well-controlled.

## 2024-01-15 NOTE — Assessment & Plan Note (Signed)
 Well-controlled on current regimen with carvedilol , Entresto . Considering his thoracic and abdominal aortic aneurysm history, target blood pressure strictly below 130/80 mmHg.

## 2024-01-15 NOTE — Patient Instructions (Signed)

## 2024-01-15 NOTE — Assessment & Plan Note (Signed)
 recovered LVEF [echocardiogram September 2023 EF 55 to 60%; was reduced 45 to 50% in September 2019 on echocardiogram at [facility] in ischemic workup with Lexiscan nuclear imaging at the time showed no ischemia]   Remains mildly hypervolemic. Unable to check weights. Recommended keep salt intake minimal less than 2 g/day. Continue torsemide  currently he is taking torsemide  20 mg once daily in the morning and tends to miss out on the afternoon doses. Advised him to take torsemide  20 mg every other day in the afternoon consistently.  Continue guideline directed medical therapy for cardiomyopathy Currently on Entresto  97-103 mg twice daily Carvedilol  3.125 mg twice daily Considering limited mobility not a candidate for SGLT2 inhibitors. Given recovered LVEF and compensated state we will hold off on escalating spironolactone.

## 2024-01-21 ENCOUNTER — Encounter: Payer: Self-pay | Admitting: Hematology and Oncology

## 2024-01-24 ENCOUNTER — Other Ambulatory Visit: Payer: Self-pay

## 2024-01-24 DIAGNOSIS — G8929 Other chronic pain: Secondary | ICD-10-CM

## 2024-01-24 MED ORDER — OXYCODONE HCL 5 MG PO TABS: 5.0000 mg | ORAL_TABLET | Freq: Four times a day (QID) | ORAL | 0 refills | Status: AC | PRN

## 2024-02-13 ENCOUNTER — Inpatient Hospital Stay

## 2024-02-13 ENCOUNTER — Other Ambulatory Visit (HOSPITAL_BASED_OUTPATIENT_CLINIC_OR_DEPARTMENT_OTHER): Admitting: Radiology

## 2024-06-11 ENCOUNTER — Other Ambulatory Visit (HOSPITAL_BASED_OUTPATIENT_CLINIC_OR_DEPARTMENT_OTHER): Admitting: Radiology

## 2024-06-11 ENCOUNTER — Inpatient Hospital Stay

## 2024-06-18 ENCOUNTER — Inpatient Hospital Stay: Admitting: Oncology
